# Patient Record
Sex: Female | Born: 1955
Health system: Southern US, Community
[De-identification: ages and names within clinical notes are randomized; demographics above are authoritative.]

## PROBLEM LIST (undated history)

## (undated) DIAGNOSIS — T8859XA Other complications of anesthesia, initial encounter: Secondary | ICD-10-CM

## (undated) DIAGNOSIS — Z8489 Family history of other specified conditions: Secondary | ICD-10-CM

## (undated) DIAGNOSIS — Z860100 Personal history of colon polyps, unspecified: Secondary | ICD-10-CM

## (undated) DIAGNOSIS — I341 Nonrheumatic mitral (valve) prolapse: Secondary | ICD-10-CM

## (undated) DIAGNOSIS — C801 Malignant (primary) neoplasm, unspecified: Secondary | ICD-10-CM

## (undated) DIAGNOSIS — K589 Irritable bowel syndrome without diarrhea: Secondary | ICD-10-CM

## (undated) DIAGNOSIS — I059 Rheumatic mitral valve disease, unspecified: Secondary | ICD-10-CM

## (undated) DIAGNOSIS — R112 Nausea with vomiting, unspecified: Secondary | ICD-10-CM

## (undated) DIAGNOSIS — I10 Essential (primary) hypertension: Secondary | ICD-10-CM

## (undated) DIAGNOSIS — T7840XA Allergy, unspecified, initial encounter: Secondary | ICD-10-CM

## (undated) DIAGNOSIS — Z9889 Other specified postprocedural states: Secondary | ICD-10-CM

## (undated) HISTORY — DX: Allergy, unspecified, initial encounter: T78.40XA

## (undated) HISTORY — DX: Rheumatic mitral valve disease, unspecified: I05.9

## (undated) HISTORY — DX: Irritable bowel syndrome, unspecified: K58.9

## (undated) HISTORY — PX: ABDOMINAL HYSTERECTOMY: SHX81

## (undated) HISTORY — DX: Essential (primary) hypertension: I10

## (undated) HISTORY — PX: SHOULDER SURGERY: SHX246

## (undated) HISTORY — PX: APPENDECTOMY: SHX54

## (undated) HISTORY — DX: Personal history of colon polyps, unspecified: Z86.0100

## (undated) HISTORY — DX: Personal history of colonic polyps: Z86.010

## (undated) HISTORY — PX: CHOLECYSTECTOMY: SHX55

---

## 1998-12-10 HISTORY — PX: LASIK: SHX215

## 2002-11-03 ENCOUNTER — Ambulatory Visit (HOSPITAL_BASED_OUTPATIENT_CLINIC_OR_DEPARTMENT_OTHER): Admission: RE | Admit: 2002-11-03 | Discharge: 2002-11-03 | Payer: Self-pay | Admitting: Orthopaedic Surgery

## 2008-12-14 ENCOUNTER — Ambulatory Visit: Payer: Self-pay | Admitting: Gastroenterology

## 2008-12-24 ENCOUNTER — Ambulatory Visit: Payer: Self-pay | Admitting: Gastroenterology

## 2008-12-24 ENCOUNTER — Encounter: Payer: Self-pay | Admitting: Gastroenterology

## 2008-12-28 ENCOUNTER — Encounter: Payer: Self-pay | Admitting: Gastroenterology

## 2011-04-27 NOTE — Op Note (Signed)
NAME:  Cindy Blake, Cindy Blake                          ACCOUNT NO.:  [id number]   MEDICAL RECORD NO.:  [id number]                   PATIENT TYPE:  AMB   LOCATION:  [facility]                                  FACILITY:  [facility]   PHYSICIAN:  Lubertha Basque. Jerl Santos, M.D.             DATE OF BIRTH:  [birth year]   DATE OF PROCEDURE:  11/03/2002  DATE OF DISCHARGE:                                 OPERATIVE REPORT   PREOPERATIVE DIAGNOSES:  Right shoulder impingement.   POSTOPERATIVE DIAGNOSES:  Right shoulder impingement.   OPERATION PERFORMED:  1. Right shoulder arthroscopic acromioplasty.  2. Right shoulder partial clavicectomy.  3. Right shoulder arthroscopic debridement.   SURGEON:  Lubertha Basque. Jerl Santos, M.D.   ASSISTANT:  Prince Rome, P.A.   ANESTHESIA:  General and block.   INDICATIONS FOR PROCEDURE:  The patient is a 55 year old woman with a many  month history of right shoulder pain.  This has persisted despite oral anti-  inflammatories and an exercise program.  She also achieved transient relief  with two subacromial injections.  She is offered an arthroscopy with pain on  activity and pain at rest.  The procedure was discussed with the patient and  informed operative consent was obtained after discussion of possible  complications of reaction to anesthesia and infection.   DESCRIPTION OF PROCEDURE:  The patient was taken to the operating suite  where general anesthetic was applied without difficulty.  She was also given  a block in the preanesthesia area. The patient was positioned in beach chair  position and prepped and draped in the normal sterile fashion.  After  administration of preop intravenous antibiotics, an arthroscopy of the right  shoulder was performed through a total of two portals.  Glenohumeral joint  showed no degenerative change and the biceps tendon had a tiny tear which  was less than 5% of the substance of its tendon.  The rotator cuff appeared  completely benign  from below.  She did have some adhesions preoperatively  and a brief manipulation was done restoring just the very terminal portion  of her forward flexion and external rotation.  In the subacromial space, she  had some partial thickness tearing of the rotator cuff addressed with a  debridement and a thorough bursectomy.  She had a subacromial morphology  addressed with a bur to change the shape to a type 1 flat acromion.  This  was done with the bur in the lateral position followed by transfer of the  bur to the posterior position.  The cuff of the arm was thoroughly examined  and no full thickness tear could be found.  She did appear to have some  impingement from the undersurface of her clavicle and a brief partial  clavicectomy was done without a formal AC decompression.  The shoulder was  thoroughly irrigated at the end of the case followed by placement of  Marcaine  with epinephrine.  Simple sutures of nylon were used to loosely  reapproximate the portals followed by Adaptic and a dry gauze dressing with  tape.  Estimated blood loss and intraoperative fluids can be obtained from  anesthesia records.    DISPOSITION:  The patient was taken to the recovery room in stable  condition.  Plans were for the patient  to go home the same day and to  follow up in the office in less than a week.  I will contact her by phone  tonight.                                                 Lubertha Basque Jerl Santos, M.D.    PGD/MEDQ  D:  11/03/2002  T:  11/03/2002  Job:  [id number]

## 2013-03-04 ENCOUNTER — Other Ambulatory Visit: Payer: Self-pay | Admitting: *Deleted

## 2013-03-04 MED ORDER — ALPRAZOLAM 1 MG PO TABS: ORAL_TABLET | ORAL | Status: DC

## 2013-05-15 ENCOUNTER — Other Ambulatory Visit (INDEPENDENT_AMBULATORY_CARE_PROVIDER_SITE_OTHER): Payer: 59

## 2013-05-15 DIAGNOSIS — E785 Hyperlipidemia, unspecified: Secondary | ICD-10-CM

## 2013-05-15 DIAGNOSIS — Z0000 Encounter for general adult medical examination without abnormal findings: Secondary | ICD-10-CM

## 2013-05-15 DIAGNOSIS — E559 Vitamin D deficiency, unspecified: Secondary | ICD-10-CM

## 2013-05-15 DIAGNOSIS — I10 Essential (primary) hypertension: Secondary | ICD-10-CM

## 2013-05-15 LAB — POCT CBC
Granulocyte percent: 60.1 %G (ref 37–80)
Lymph, poc: 1.6 (ref 0.6–3.4)
MPV: 8.2 fL (ref 0–99.8)
POC Granulocyte: 2.9 (ref 2–6.9)
POC LYMPH PERCENT: 32.4 %L (ref 10–50)
Platelet Count, POC: 227 10*3/uL (ref 142–424)
RDW, POC: 12.6 %
WBC: 4.9 10*3/uL (ref 4.6–10.2)

## 2013-05-15 LAB — BASIC METABOLIC PANEL WITH GFR
BUN: 11 mg/dL (ref 6–23)
Chloride: 103 mEq/L (ref 96–112)
GFR, Est Non African American: 89 mL/min
Potassium: 4.1 mEq/L (ref 3.5–5.3)

## 2013-05-15 LAB — THYROID PANEL WITH TSH: TSH: 1.332 u[IU]/mL (ref 0.350–4.500)

## 2013-05-18 LAB — NMR LIPOPROFILE WITH LIPIDS
HDL Particle Number: 42 umol/L (ref >=30.5)
HDL Size: 9.8 nm (ref >=9.2)
LDL (calc): 129 mg/dL — ABNORMAL HIGH (ref <100)
LDL Particle Number: 1321 nmol/L — ABNORMAL HIGH (ref <1000)
LDL Size: 21.8 nm (ref >20.5)
LP-IR Score: <25 (ref <=45)
Small LDL Particle Number: 202 nmol/L (ref <=527)
VLDL Size: 38.9 nm (ref <=46.6)

## 2013-05-20 ENCOUNTER — Telehealth: Payer: Self-pay | Admitting: *Deleted

## 2013-05-20 NOTE — Telephone Encounter (Signed)
Pt notified of results

## 2013-05-20 NOTE — Telephone Encounter (Signed)
Message copied by Bearl Mulberry on Wed May 20, 2013  6:17 PM ------      Message from: Ernestina Penna      Created: Mon May 18, 2013  5:48 PM       The total LDL particle number is elevated at 1321      LDL C. is elevated at 129, triglyceride are good, LDL size is large------ try to do better with therapeutic lifestyle changes which maintenance diet and exercise      All thyroid tests within normal limit      Vitamin D level is good      Blood sugar and renal function tests good             ------

## 2013-05-21 ENCOUNTER — Encounter: Payer: Self-pay | Admitting: *Deleted

## 2013-05-25 ENCOUNTER — Telehealth: Payer: Self-pay | Admitting: Family Medicine

## 2013-05-25 NOTE — Telephone Encounter (Signed)
appt made

## 2013-06-17 ENCOUNTER — Ambulatory Visit: Payer: Self-pay | Admitting: Family Medicine

## 2013-06-23 ENCOUNTER — Ambulatory Visit: Payer: Self-pay | Admitting: Family Medicine

## 2013-07-06 ENCOUNTER — Ambulatory Visit (INDEPENDENT_AMBULATORY_CARE_PROVIDER_SITE_OTHER): Payer: 59 | Admitting: Family Medicine

## 2013-07-06 ENCOUNTER — Encounter: Payer: Self-pay | Admitting: Family Medicine

## 2013-07-06 VITALS — BP 146/64 | HR 66 | Temp 97.5°F | Ht 63.0 in | Wt 157.4 lb

## 2013-07-06 DIAGNOSIS — E559 Vitamin D deficiency, unspecified: Secondary | ICD-10-CM

## 2013-07-06 DIAGNOSIS — E785 Hyperlipidemia, unspecified: Secondary | ICD-10-CM

## 2013-07-06 DIAGNOSIS — I10 Essential (primary) hypertension: Secondary | ICD-10-CM

## 2013-07-06 DIAGNOSIS — B079 Viral wart, unspecified: Secondary | ICD-10-CM

## 2013-07-06 NOTE — Patient Instructions (Addendum)
Continue current meds and therapeutic lifestyle changes Return FOBT There may be slight swelling at the site of the cryotherapy. Just keep the area is cleaned with soap and water and or alcohol. Use allergy medicine regularly May have to add an antihistamine like Allegra, Claritin, Zyrtec, or Benadryl. Drink plenty of fluids Avoid conditions which would be aggravating to the respiratory tract.

## 2013-07-06 NOTE — Progress Notes (Signed)
  Subjective:    Patient ID: Cindy Blake, female    DOB: 1956/07/01, 57 y.o.   MRN: [id number]  HPI Patient returns to clinic today for followup of chronic medical problems. These include hypertension hyperlipidemia vitamin D deficiency and irritable bowel syndrome. She also describes arthralgias in her back. She also complains of some lesions on her chest and her left finger.   Review of Systems  Constitutional: Positive for fatigue.  HENT: Positive for congestion, postnasal drip and sinus pressure. Negative for ear pain and sore throat.   Eyes: Negative.   Respiratory: Positive for cough (dry). Negative for chest tightness and wheezing.   Cardiovascular: Positive for leg swelling (slight).  Gastrointestinal: Positive for abdominal pain (with IBS), diarrhea (intermitent due to IBS) and constipation (intermitent due to IBS). Negative for vomiting.  Genitourinary: Positive for frequency. Negative for dysuria.  Musculoskeletal: Positive for back pain (mid thoracic, LBP) and arthralgias (bilateral legs and hips, knees occasional shoulders).  Skin: Positive for rash (L upper chest).  Allergic/Immunologic: Positive for environmental allergies.  Neurological: Positive for dizziness (occasional) and headaches.  Hematological: Negative.   Psychiatric/Behavioral: Positive for sleep disturbance (intermitent).       Objective:   Physical Exam BP 146/64  Pulse 66  Temp(Src) 97.5 F (36.4 C) (Oral)  Ht 5\' 3"  (1.6 m)  Wt 157 lb 6.4 oz (71.396 kg)  BMI 27.89 kg/m2  The patient appeared well nourished and normally developed, alert and oriented to time and place. Speech, behavior and judgement appear normal. Vital signs as documented.  Head exam is unremarkable. No scleral icterus or pallor noted. There was slight nasal congestion. Mouth and throat were normal. Ears were normal.  Neck is without jugular venous distension, thyromegally, or carotid bruits. Carotid upstrokes are brisk bilaterally.  No cervical adenopathy. Lungs are clear anteriorly and posteriorly to auscultation. Normal respiratory effort. Cardiac exam reveals regular rate and rhythm at 72 per minute. First and second heart sounds normal.  No murmurs, rubs or gallops.  Abdominal exam reveals normal bowl sounds, no masses, no organomegaly and no aortic enlargement. No inguinal adenopathy. There is no tenderness. Extremities are nonedematous and both femoral and pedal pulses are normal. Skin without pallor or jaundice.  Warm and dry, without rash. Patient has 2-3 warty-like lesions on her anterior chest wall and one on her left third finger. Neurologic exam reveals normal deep tendon reflexes and normal sensation.    Cryotherapy will be done on the chest wall lesion as well as a the left finger were lesion. Patient understood the procedure.      Assessment & Plan:  1. Hypertension  2. Hyperlipemia  3. Vitamin D deficiency  4. Flat warts of chest wall and finger Patient Instructions  Continue current meds and therapeutic lifestyle changes Return FOBT There may be slight swelling at the site of the cryotherapy. Just keep the area is cleaned with soap and water and or alcohol. Use allergy medicine regularly May have to add an antihistamine like Allegra, Claritin, Zyrtec, or Benadryl. Drink plenty of fluids Avoid conditions which would be aggravating to the respiratory tract.   Nyra Capes MD

## 2013-08-13 ENCOUNTER — Other Ambulatory Visit: Payer: Self-pay | Admitting: Family Medicine

## 2013-10-07 ENCOUNTER — Other Ambulatory Visit: Payer: Self-pay | Admitting: Family Medicine

## 2013-10-15 ENCOUNTER — Other Ambulatory Visit: Payer: Self-pay

## 2013-10-29 ENCOUNTER — Other Ambulatory Visit: Payer: Self-pay | Admitting: Family Medicine

## 2013-10-30 NOTE — Telephone Encounter (Signed)
This is okay for 3 months 

## 2013-10-30 NOTE — Telephone Encounter (Signed)
Patient last seen in office on 07-06-13. 5 refills given on 02-12-13. Please advise. If approved please route to Pool A so nurse can phone in to pharmacy

## 2013-11-02 NOTE — Telephone Encounter (Signed)
Pt med called into pharm  

## 2013-11-10 ENCOUNTER — Encounter: Payer: Self-pay | Admitting: Family Medicine

## 2013-11-10 ENCOUNTER — Ambulatory Visit (INDEPENDENT_AMBULATORY_CARE_PROVIDER_SITE_OTHER): Payer: 59

## 2013-11-10 ENCOUNTER — Ambulatory Visit (INDEPENDENT_AMBULATORY_CARE_PROVIDER_SITE_OTHER): Payer: 59 | Admitting: Family Medicine

## 2013-11-10 VITALS — BP 143/69 | HR 67 | Temp 97.6°F | Ht 63.0 in | Wt 145.0 lb

## 2013-11-10 DIAGNOSIS — F411 Generalized anxiety disorder: Secondary | ICD-10-CM

## 2013-11-10 DIAGNOSIS — E559 Vitamin D deficiency, unspecified: Secondary | ICD-10-CM

## 2013-11-10 DIAGNOSIS — I10 Essential (primary) hypertension: Secondary | ICD-10-CM

## 2013-11-10 DIAGNOSIS — J309 Allergic rhinitis, unspecified: Secondary | ICD-10-CM

## 2013-11-10 DIAGNOSIS — E785 Hyperlipidemia, unspecified: Secondary | ICD-10-CM

## 2013-11-10 MED ORDER — AZELASTINE HCL 0.1 % NA SOLN: NASAL | Status: DC

## 2013-11-10 NOTE — Progress Notes (Signed)
Subjective:    Patient ID: Cindy Blake, female    DOB: May 15, 1956, 57 y.o.   MRN: [id number]  HPI Pt here for follow up and management of chronic medical problems. Patient complains today of increased sinus pressure and congestion. She also complains with some problems with her legs being restless at nighttime       Patient Active Problem List   Diagnosis Date Noted  . Hyperlipemia 07/06/2013  . Hypertension 07/06/2013  . Vitamin D deficiency 07/06/2013   Outpatient Encounter Prescriptions as of 11/10/2013  Medication Sig  . ALPRAZolam (XANAX) 1 MG tablet TAKE 1/2 TO 1 TABLET EVERY MORNING & BEDTIME AS NEEDED FOR ANXIETY & SLEEP  . aspirin 81 MG tablet Take 81 mg by mouth daily.  . Calcium Carbonate (CALCIUM 600 PO) Take 1 tablet by mouth daily.  . Cholecalciferol (VITAMIN D) 2000 UNITS CAPS Take 1 capsule by mouth daily.  Marland Kitchen estradiol (ESTRACE) 1 MG tablet Take 1 mg by mouth daily.  . fluticasone (FLONASE) 50 MCG/ACT nasal spray 1 SPRAY IN EACH NOSTRIL ONCE A DAY  . hydrochlorothiazide (HYDRODIURIL) 25 MG tablet TAKE 1 TABLET ONCE A DAY  . losartan (COZAAR) 50 MG tablet TAKE 1 TABLET ONCE A DAY  . Multiple Vitamin (MULTIVITAMIN) tablet Take 1 tablet by mouth daily.    Review of Systems  Constitutional: Negative.   HENT: Positive for sinus pressure (on mucinex x 1 week).   Eyes: Negative.   Respiratory: Negative.   Cardiovascular: Negative.   Gastrointestinal: Negative.   Endocrine: Negative.   Genitourinary: Negative.   Musculoskeletal: Negative.   Skin: Negative.   Allergic/Immunologic: Negative.   Neurological: Negative.   Hematological: Negative.   Psychiatric/Behavioral: Negative.        Objective:   Physical Exam  Nursing note and vitals reviewed. Constitutional: She is oriented to person, place, and time. She appears well-developed and well-nourished. No distress.  HENT:  Head: Normocephalic and atraumatic.  Right Ear: External ear normal.  Left Ear:  External ear normal.  Mouth/Throat: Oropharynx is clear and moist.  Nasal irritation bilateral  Eyes: Conjunctivae and EOM are normal. Pupils are equal, round, and reactive to light. Right eye exhibits no discharge. Left eye exhibits no discharge. No scleral icterus.  Neck: Normal range of motion. Neck supple. No thyromegaly present.  Cardiovascular: Normal rate, regular rhythm and intact distal pulses.  Exam reveals gallop.   No murmur heard. 72 per minute  Pulmonary/Chest: Effort normal and breath sounds normal. No respiratory distress. She has no wheezes. She has no rales. She exhibits no tenderness.  Abdominal: Soft. Bowel sounds are normal. She exhibits no mass. There is no tenderness. There is no rebound and no guarding.  Musculoskeletal: Normal range of motion. She exhibits no edema and no tenderness.  Lymphadenopathy:    She has no cervical adenopathy.  Neurological: She is alert and oriented to person, place, and time. She has normal reflexes. No cranial nerve deficit.  Skin: Skin is warm and dry. No rash noted.  Psychiatric: She has a normal mood and affect. Her behavior is normal. Judgment and thought content normal.   BP 143/69  Pulse 67  Temp(Src) 97.6 F (36.4 C) (Oral)  Ht 5\' 3"  (1.6 m)  Wt 145 lb (65.772 kg)  BMI 25.69 kg/m2        Assessment & Plan:  1. Hypertension - DG Chest 2 View; Future  2. Vitamin D deficiency  3. Hyperlipemia  4. Generalized anxiety disorder  5.  Allergic rhinitis - azelastine (ASTELIN) 137 MCG/SPRAY nasal spray; Use in each nostril as directed 1-2 sprays  Dispense: 30 mL; Refill: 12  Meds ordered this encounter  Medications  . aspirin 81 MG tablet    Sig: Take 81 mg by mouth daily.  Marland Kitchen azelastine (ASTELIN) 137 MCG/SPRAY nasal spray    Sig: Use in each nostril as directed 1-2 sprays    Dispense:  30 mL    Refill:  12   Patient Instructions  Continue current medications. Continue good therapeutic lifestyle changes which  include good diet and exercise. Fall precautions discussed with patient. Schedule your flu vaccine if you haven't had it yet If you are over 28 years old - you may need Prevnar 13 or the adult Pneumonia vaccine. Drink plenty of water Try to get up frequently during the day and walk around more, used the steps instead of the elevator Use some good support hose this morning her Use a cool mist humidifier in the home at night Use the additional nose spray along with the flonase Continue to monitor blood pressures at home when possible and watch her sodium intake   Nyra Capes MD

## 2013-11-10 NOTE — Patient Instructions (Addendum)
Continue current medications. Continue good therapeutic lifestyle changes which include good diet and exercise. Fall precautions discussed with patient. Schedule your flu vaccine if you haven't had it yet If you are over 57 years old - you may need Prevnar 13 or the adult Pneumonia vaccine. Drink plenty of water Try to get up frequently during the day and walk around more, used the steps instead of the elevator Use some good support hose this morning her Use a cool mist humidifier in the home at night Use the additional nose spray along with the flonase Continue to monitor blood pressures at home when possible and watch her sodium intake

## 2013-11-11 ENCOUNTER — Telehealth: Payer: Self-pay | Admitting: Family Medicine

## 2013-11-11 ENCOUNTER — Telehealth: Payer: Self-pay

## 2013-11-11 NOTE — Telephone Encounter (Signed)
Message copied by Roselee Culver on Wed Nov 11, 2013 10:27 AM ------      Message from: Ernestina Penna      Created: Wed Nov 11, 2013  9:58 AM       As per radiology report ------

## 2013-11-20 ENCOUNTER — Other Ambulatory Visit (INDEPENDENT_AMBULATORY_CARE_PROVIDER_SITE_OTHER): Payer: 59

## 2013-11-20 DIAGNOSIS — Z1212 Encounter for screening for malignant neoplasm of rectum: Secondary | ICD-10-CM

## 2013-11-20 NOTE — Progress Notes (Signed)
Pt dropped off FOBT only 

## 2013-11-22 LAB — FECAL OCCULT BLOOD, IMMUNOCHEMICAL: Fecal Occult Bld: NEGATIVE

## 2013-12-16 ENCOUNTER — Encounter: Payer: Self-pay | Admitting: General Practice

## 2013-12-16 ENCOUNTER — Ambulatory Visit (INDEPENDENT_AMBULATORY_CARE_PROVIDER_SITE_OTHER): Payer: 59 | Admitting: General Practice

## 2013-12-16 VITALS — BP 146/70 | HR 66 | Temp 98.9°F | Ht 63.0 in | Wt 144.5 lb

## 2013-12-16 DIAGNOSIS — J029 Acute pharyngitis, unspecified: Secondary | ICD-10-CM

## 2013-12-16 DIAGNOSIS — J019 Acute sinusitis, unspecified: Secondary | ICD-10-CM

## 2013-12-16 LAB — POCT RAPID STREP A (OFFICE): Rapid Strep A Screen: NEGATIVE

## 2013-12-16 MED ORDER — PREDNISONE (PAK) 10 MG PO TABS: ORAL_TABLET | ORAL | Status: DC

## 2013-12-16 MED ORDER — AZITHROMYCIN 250 MG PO TABS: ORAL_TABLET | ORAL | Status: DC

## 2013-12-16 NOTE — Patient Instructions (Signed)

## 2013-12-16 NOTE — Progress Notes (Signed)
   Subjective:    Patient ID: Cindy Blake, female    DOB: January 28, 1956, 58 y.o.   MRN: [id number]  Sinusitis This is a new problem. The current episode started in the past 7 days. The problem is unchanged. There has been no fever. Associated symptoms include congestion, coughing and sinus pressure. Pertinent negatives include no chills or shortness of breath. Past treatments include oral decongestants and spray decongestants. The treatment provided mild relief.  Sore Throat  This is a new problem. The problem has been gradually improving. There has been no fever. Associated symptoms include congestion and coughing. Pertinent negatives include no diarrhea or shortness of breath. She has had no exposure to strep or mono. She has tried nothing for the symptoms.      Review of Systems  Constitutional: Negative for fever and chills.  HENT: Positive for congestion, postnasal drip and sinus pressure.   Respiratory: Positive for cough. Negative for chest tightness and shortness of breath.   Cardiovascular: Negative for chest pain and palpitations.  Gastrointestinal: Negative for diarrhea.  All other systems reviewed and are negative.       Objective:   Physical Exam  Constitutional: She is oriented to person, place, and time. She appears well-developed and well-nourished.  HENT:  Head: Normocephalic and atraumatic.  Right Ear: External ear normal.  Left Ear: External ear normal.  Nose: Right sinus exhibits maxillary sinus tenderness and frontal sinus tenderness. Left sinus exhibits maxillary sinus tenderness and frontal sinus tenderness.  Mouth/Throat: Oropharynx is clear and moist.  Cardiovascular: Normal rate, regular rhythm and normal heart sounds.   Pulmonary/Chest: Effort normal and breath sounds normal. No respiratory distress. She exhibits no tenderness.  Neurological: She is alert and oriented to person, place, and time.  Skin: Skin is warm and dry.  Psychiatric: She has a normal  mood and affect.     Results for orders placed in visit on 12/16/13  POCT RAPID STREP A (OFFICE)      Result Value Range   Rapid Strep A Screen Negative  Negative        Assessment & Plan:  1. Sore throat  - POCT rapid strep A  2. Sinusitis, acute  - azithromycin (ZITHROMAX) 250 MG tablet; Take as directed  Dispense: 6 tablet; Refill: 0 - predniSONE (STERAPRED UNI-PAK) 10 MG tablet; Take as directed  Dispense: 21 tablet; Refill: 0 -RTO if symptoms worsen or unresolved -Patient verbalized understanding Erby Pian, FNP-C

## 2013-12-17 ENCOUNTER — Telehealth: Payer: Self-pay | Admitting: General Practice

## 2014-03-09 ENCOUNTER — Ambulatory Visit (INDEPENDENT_AMBULATORY_CARE_PROVIDER_SITE_OTHER): Payer: 59 | Admitting: Family Medicine

## 2014-03-09 ENCOUNTER — Encounter: Payer: Self-pay | Admitting: Family Medicine

## 2014-03-09 VITALS — BP 140/70 | HR 59 | Temp 99.8°F | Ht 63.0 in | Wt 142.0 lb

## 2014-03-09 DIAGNOSIS — I10 Essential (primary) hypertension: Secondary | ICD-10-CM

## 2014-03-09 DIAGNOSIS — E559 Vitamin D deficiency, unspecified: Secondary | ICD-10-CM

## 2014-03-09 DIAGNOSIS — E785 Hyperlipidemia, unspecified: Secondary | ICD-10-CM

## 2014-03-09 DIAGNOSIS — J309 Allergic rhinitis, unspecified: Secondary | ICD-10-CM

## 2014-03-09 DIAGNOSIS — J329 Chronic sinusitis, unspecified: Secondary | ICD-10-CM

## 2014-03-09 MED ORDER — AZELASTINE HCL 0.1 % NA SOLN: NASAL | Status: DC

## 2014-03-09 MED ORDER — AMOXICILLIN 500 MG PO CAPS: 500.0000 mg | ORAL_CAPSULE | Freq: Three times a day (TID) | ORAL | Status: DC

## 2014-03-09 MED ORDER — FLUTICASONE PROPIONATE 50 MCG/ACT NA SUSP: NASAL | Status: DC

## 2014-03-09 NOTE — Progress Notes (Signed)
Subjective:    Patient ID: Cindy Blake, female    DOB: Jan 08, 1956, 58 y.o.   MRN: [id number]  HPI Pt here for follow up and management of chronic medical problems. The patient complains of head congestion some cough and fever further several days. She is up-to-date on her health maintenance issues he'll get her lab work done when she comes in June.        Patient Active Problem List   Diagnosis Date Noted  . Generalized anxiety disorder 11/10/2013  . Allergic rhinitis 11/10/2013  . Hyperlipemia 07/06/2013  . Hypertension 07/06/2013  . Vitamin D deficiency 07/06/2013   Outpatient Encounter Prescriptions as of 03/09/2014  Medication Sig  . ALPRAZolam (XANAX) 1 MG tablet TAKE 1/2 TO 1 TABLET EVERY MORNING & BEDTIME AS NEEDED FOR ANXIETY & SLEEP  . aspirin 81 MG tablet Take 81 mg by mouth daily.  Marland Kitchen azelastine (ASTELIN) 137 MCG/SPRAY nasal spray Use in each nostril as directed 1-2 sprays  . Calcium Carbonate (CALCIUM 600 PO) Take 1 tablet by mouth daily.  . Cholecalciferol (VITAMIN D) 2000 UNITS CAPS Take 1 capsule by mouth daily.  Marland Kitchen estradiol (ESTRACE) 1 MG tablet Take 1 mg by mouth daily.  . fluticasone (FLONASE) 50 MCG/ACT nasal spray 1 SPRAY IN EACH NOSTRIL ONCE A DAY  . hydrochlorothiazide (HYDRODIURIL) 25 MG tablet TAKE 1 TABLET ONCE A DAY  . losartan (COZAAR) 50 MG tablet TAKE 1 TABLET ONCE A DAY  . Multiple Vitamin (MULTIVITAMIN) tablet Take 1 tablet by mouth daily.  . [DISCONTINUED] azithromycin (ZITHROMAX) 250 MG tablet Take as directed  . [DISCONTINUED] predniSONE (STERAPRED UNI-PAK) 10 MG tablet Take as directed    Review of Systems  Constitutional: Positive for fever.  HENT: Positive for congestion, sinus pressure and sore throat (started sunday).        Ringing in ears  Eyes: Negative.   Respiratory: Positive for cough (little).   Cardiovascular: Negative.   Gastrointestinal: Negative.   Endocrine: Negative.   Genitourinary: Negative.   Musculoskeletal:  Negative.        Left hand trembles at times  Skin: Negative.   Allergic/Immunologic: Negative.   Neurological: Positive for light-headedness and headaches.  Hematological: Negative.   Psychiatric/Behavioral: Negative.        Objective:   Physical Exam  Nursing note and vitals reviewed. Constitutional: She is oriented to person, place, and time. She appears well-developed and well-nourished. No distress.  HENT:  Head: Normocephalic and atraumatic.  Right Ear: External ear normal.  Left Ear: External ear normal.  Mouth/Throat: Oropharynx is clear and moist. No oropharyngeal exudate.  Nasal congestion bilaterally left greater than right  Eyes: Conjunctivae and EOM are normal. Pupils are equal, round, and reactive to light. Right eye exhibits no discharge. Left eye exhibits no discharge. No scleral icterus.  Neck: Normal range of motion. Neck supple. No thyromegaly present.  Anterior cervical tenderness  Cardiovascular: Normal rate, regular rhythm, normal heart sounds and intact distal pulses.  Exam reveals no gallop and no friction rub.   No murmur heard. Pulmonary/Chest: Effort normal and breath sounds normal. No respiratory distress. She has no wheezes. She has no rales. She exhibits no tenderness.  Dry cough  Abdominal: Soft. Bowel sounds are normal. She exhibits no mass. There is no tenderness. There is no rebound and no guarding.  Musculoskeletal: Normal range of motion. She exhibits no edema.  Lymphadenopathy:    She has no cervical adenopathy.  Neurological: She is alert and oriented to  person, place, and time. She has normal reflexes. No cranial nerve deficit.  Skin: Skin is warm and dry. No rash noted.  Psychiatric: She has a normal mood and affect. Her behavior is normal. Judgment and thought content normal.   BP 140/70  Pulse 59  Temp(Src) 99.8 F (37.7 C) (Oral)  Ht 5\' 3"  (1.6 m)  Wt 142 lb (64.411 kg)  BMI 25.16 kg/m2        Assessment & Plan:  1.  Hyperlipemia -Recheck this in June  2. Hypertension -Home readings are generally running in the 130s over the 70s, she will continue to monitor her blood pressures at home  3. Vitamin D deficiency -Continue current  4. Allergic rhinitis - fluticasone (FLONASE) 50 MCG/ACT nasal spray; 1-2 sprays each nostril daily at bedtime as directed  Dispense: 16 g; Refill: 1 - azelastine (ASTELIN) 137 MCG/SPRAY nasal spray; Use in each nostril as directed 1-2 sprays  Dispense: 30 mL; Refill: 12  5. Rhinosinusitis - fluticasone (FLONASE) 50 MCG/ACT nasal spray; 1-2 sprays each nostril daily at bedtime as directed  Dispense: 16 g; Refill: 1 - amoxicillin (AMOXIL) 500 MG capsule; Take 1 capsule (500 mg total) by mouth 3 (three) times daily.  Dispense: 30 capsule; Refill: 0   Patient Instructions  Use saline nose spray frequently during the day Use Mucinex maximum strength blue and white in color over the counter one twice daily for cough and congestion If the extra doses of Flonase and Astelin did not work he may for short period of time try some Claritin 1 daily in addition to the nose sprays Drink plenty of fluids Take Tylenol as needed for aches pains and fever   Arrie Senate MD

## 2014-03-09 NOTE — Patient Instructions (Signed)
Use saline nose spray frequently during the day Use Mucinex maximum strength blue and white in color over the counter one twice daily for cough and congestion If the extra doses of Flonase and Astelin did not work he may for short period of time try some Claritin 1 daily in addition to the nose sprays Drink plenty of fluids Take Tylenol as needed for aches pains and fever

## 2014-04-13 ENCOUNTER — Other Ambulatory Visit: Payer: Self-pay | Admitting: Family Medicine

## 2014-04-15 NOTE — Telephone Encounter (Signed)
This medicine is okay to refill for 6 months

## 2014-04-15 NOTE — Telephone Encounter (Signed)
Called to [facility]  

## 2014-04-15 NOTE — Telephone Encounter (Signed)
Last seen 03/09/14, last filled 01/27/14, Route to pool so it can be called into [facility]

## 2014-05-29 ENCOUNTER — Other Ambulatory Visit: Payer: Self-pay | Admitting: Nurse Practitioner

## 2014-05-31 NOTE — Telephone Encounter (Signed)
Patient NTBS for follow up and lab work  

## 2014-07-20 ENCOUNTER — Ambulatory Visit: Payer: 59 | Admitting: Family Medicine

## 2014-07-22 ENCOUNTER — Ambulatory Visit (INDEPENDENT_AMBULATORY_CARE_PROVIDER_SITE_OTHER): Payer: 59 | Admitting: Family Medicine

## 2014-07-22 ENCOUNTER — Encounter: Payer: Self-pay | Admitting: Family Medicine

## 2014-07-22 VITALS — BP 140/88 | HR 57 | Temp 99.1°F | Ht 63.0 in | Wt 146.0 lb

## 2014-07-22 DIAGNOSIS — I10 Essential (primary) hypertension: Secondary | ICD-10-CM

## 2014-07-22 DIAGNOSIS — E785 Hyperlipidemia, unspecified: Secondary | ICD-10-CM

## 2014-07-22 DIAGNOSIS — E559 Vitamin D deficiency, unspecified: Secondary | ICD-10-CM

## 2014-07-22 LAB — POCT CBC
Granulocyte percent: 72.5 %G (ref 37–80)
HCT, POC: 38.2 % (ref 37.7–47.9)
HEMOGLOBIN: 12.9 g/dL (ref 12.2–16.2)
Lymph, poc: 1.5 (ref 0.6–3.4)
MCH, POC: 30.4 pg (ref 27–31.2)
MCHC: 33.8 g/dL (ref 31.8–35.4)
MCV: 90 fL (ref 80–97)
MPV: 8.5 fL (ref 0–99.8)
POC GRANULOCYTE: 4.5 (ref 2–6.9)
POC LYMPH %: 23.5 % (ref 10–50)
Platelet Count, POC: 229 10*3/uL (ref 142–424)
RBC: 4.3 M/uL (ref 4.04–5.48)
RDW, POC: 12.8 %
WBC: 6.2 10*3/uL (ref 4.6–10.2)

## 2014-07-22 MED ORDER — HYDROCHLOROTHIAZIDE 25 MG PO TABS: ORAL_TABLET | ORAL | Status: DC

## 2014-07-22 MED ORDER — LOSARTAN POTASSIUM 50 MG PO TABS: ORAL_TABLET | ORAL | Status: DC

## 2014-07-22 NOTE — Patient Instructions (Addendum)
Continue current medications. Continue good therapeutic lifestyle changes which include good diet and exercise. Fall precautions discussed with patient. If an FOBT was given today- please return it to our front desk. If you are over 58 years old - you may need Prevnar 60 or the adult Pneumonia vaccine.  Flu Shots will be available at our office starting mid- September. Please call and schedule a FLU CLINIC APPOINTMENT. Monitor blood pressures at and brain blood pressures by to review in about 4 weeks Watch sodium intake Drink plenty of water NSAIDs are fine for headaches but remember taking irritate the stomach, also blood pressure to go up, and sometimes can cause edema in your legs.

## 2014-07-22 NOTE — Progress Notes (Signed)
Subjective:    Patient ID: Cindy Blake, female    DOB: 1956-03-27, 58 y.o.   MRN: [id number]  HPI Pt here for follow up and management of chronic medical problems. The patient has no specific complaints. She is up-to-date on her health maintenance issues except for a Prevnar vaccine. She will check with her insurance regarding this. She will get lab work today.         Patient Active Problem List   Diagnosis Date Noted  . Generalized anxiety disorder 11/10/2013  . Allergic rhinitis 11/10/2013  . Hyperlipemia 07/06/2013  . Hypertension 07/06/2013  . Vitamin D deficiency 07/06/2013   Outpatient Encounter Prescriptions as of 07/22/2014  Medication Sig  . ALPRAZolam (XANAX) 1 MG tablet TAKE 1/2 TO 1 TABLET EVERY MORNING & BEDTIME AS NEEDED FOR ANXIETY & SLEEP  . aspirin 81 MG tablet Take 81 mg by mouth daily.  Marland Kitchen azelastine (ASTELIN) 137 MCG/SPRAY nasal spray Use in each nostril as directed 1-2 sprays  . Calcium Carbonate (CALCIUM 600 PO) Take 1 tablet by mouth daily.  . Cholecalciferol (VITAMIN D) 2000 UNITS CAPS Take 1 capsule by mouth daily.  Marland Kitchen estradiol (ESTRACE) 1 MG tablet Take 1 mg by mouth daily.  . fexofenadine (ALLEGRA) 180 MG tablet Take 180 mg by mouth daily.  . fluticasone (FLONASE) 50 MCG/ACT nasal spray 1-2 sprays each nostril daily at bedtime as directed  . hydrochlorothiazide (HYDRODIURIL) 25 MG tablet TAKE 1 TABLET ONCE A DAY  . losartan (COZAAR) 50 MG tablet TAKE 1 TABLET DAILY  . Multiple Vitamin (MULTIVITAMIN) tablet Take 1 tablet by mouth daily.  . [DISCONTINUED] amoxicillin (AMOXIL) 500 MG capsule Take 1 capsule (500 mg total) by mouth 3 (three) times daily.    Review of Systems  Constitutional: Negative.   HENT: Negative.   Eyes: Negative.   Respiratory: Negative.   Cardiovascular: Negative.   Gastrointestinal: Negative.   Endocrine: Negative.   Genitourinary: Negative.   Musculoskeletal: Negative.   Skin: Negative.   Allergic/Immunologic:  Negative.   Neurological: Negative.   Hematological: Negative.   Psychiatric/Behavioral: Negative.        Objective:   Physical Exam  Nursing note and vitals reviewed. Constitutional: She is oriented to person, place, and time. She appears well-developed and well-nourished. No distress.  HENT:  Head: Normocephalic and atraumatic.  Right Ear: External ear normal.  Left Ear: External ear normal.  Nose: Nose normal.  Mouth/Throat: Oropharynx is clear and moist.  Eyes: Conjunctivae and EOM are normal. Pupils are equal, round, and reactive to light. Right eye exhibits no discharge. Left eye exhibits no discharge. No scleral icterus.  Neck: Normal range of motion. Neck supple. No thyromegaly present.  No carotid bruits auscultated  Cardiovascular: Normal rate, regular rhythm, normal heart sounds and intact distal pulses.  Exam reveals no gallop and no friction rub.   No murmur heard. 72 per minute with a regular rate and rhythm  Pulmonary/Chest: Effort normal and breath sounds normal. No respiratory distress. She has no wheezes. She has no rales. She exhibits no tenderness.  Abdominal: Soft. Bowel sounds are normal. She exhibits no mass. There is no tenderness. There is no rebound and no guarding.  Musculoskeletal: Normal range of motion. She exhibits no edema and no tenderness.  Lymphadenopathy:    She has no cervical adenopathy.  Neurological: She is alert and oriented to person, place, and time. She has normal reflexes. No cranial nerve deficit.  Skin: Skin is warm and dry. No rash  noted.  Psychiatric: She has a normal mood and affect. Her behavior is normal. Judgment and thought content normal.   BP 154/68  Pulse 57  Temp(Src) 99.1 F (37.3 C) (Oral)  Ht _0  (1.6 m)  Wt 146 lb (66.225 kg)  BMI 25.87 kg/m2  Repeat blood pressure 140/88      Assessment & Plan:  1. Hyperlipemia - NMR, lipoprofile - POCT CBC  2. Essential hypertension - BMP8+EGFR - Hepatic function  panel - POCT CBC  3. Vitamin D deficiency - POCT CBC - Vit D  25 hydroxy (rtn osteoporosis monitoring)  Meds ordered this encounter  Medications  . fexofenadine (ALLEGRA) 180 MG tablet    Sig: Take 180 mg by mouth daily.  Marland Kitchen losartan (COZAAR) 50 MG tablet    Sig: TAKE 1 TABLET DAILY    Dispense:  30 tablet    Refill:  6  . hydrochlorothiazide (HYDRODIURIL) 25 MG tablet    Sig: TAKE 1 TABLET ONCE A DAY    Dispense:  30 tablet    Refill:  6   Patient Instructions  Continue current medications. Continue good therapeutic lifestyle changes which include good diet and exercise. Fall precautions discussed with patient. If an FOBT was given today- please return it to our front desk. If you are over 35 years old - you may need Prevnar 43 or the adult Pneumonia vaccine.  Flu Shots will be available at our office starting mid- September. Please call and schedule a FLU CLINIC APPOINTMENT. Monitor blood pressures at and brain blood pressures by to review in about 4 weeks Watch sodium intake Drink plenty of water NSAIDs are fine for headaches but remember taking irritate the stomach, also blood pressure to go up, and sometimes can cause edema in your legs.      Arrie Senate MD

## 2014-07-23 LAB — BMP8+EGFR
BUN/Creatinine Ratio: 21 (ref 9–23)
BUN: 15 mg/dL (ref 6–24)
CHLORIDE: 102 mmol/L (ref 97–108)
CO2: 26 mmol/L (ref 18–29)
Calcium: 9.6 mg/dL (ref 8.7–10.2)
Creatinine, Ser: 0.73 mg/dL (ref 0.57–1.00)
GFR calc non Af Amer: 91 mL/min/{1.73_m2} (ref >59)
GFR, EST AFRICAN AMERICAN: 105 mL/min/{1.73_m2} (ref >59)
GLUCOSE: 91 mg/dL (ref 65–99)
POTASSIUM: 4.9 mmol/L (ref 3.5–5.2)
Sodium: 145 mmol/L — ABNORMAL HIGH (ref 134–144)

## 2014-07-23 LAB — NMR, LIPOPROFILE
CHOLESTEROL: 213 mg/dL — AB (ref 100–199)
HDL Cholesterol by NMR: 91 mg/dL (ref >39)
HDL Particle Number: 39.2 umol/L (ref >=30.5)
LDL Particle Number: 926 nmol/L (ref <1000)
LDL Size: 21.8 nm (ref >20.5)
LDLC SERPL CALC-MCNC: 109 mg/dL — AB (ref 0–99)
LP-IR Score: <25 (ref <=45)
Small LDL Particle Number: <90 nmol/L (ref <=527)
TRIGLYCERIDES BY NMR: 64 mg/dL (ref 0–149)

## 2014-07-23 LAB — HEPATIC FUNCTION PANEL
ALT: 7 IU/L (ref 0–32)
AST: 16 IU/L (ref 0–40)
Albumin: 4.4 g/dL (ref 3.5–5.5)
Alkaline Phosphatase: 86 IU/L (ref 39–117)
Bilirubin, Direct: 0.12 mg/dL (ref 0.00–0.40)
TOTAL PROTEIN: 6.9 g/dL (ref 6.0–8.5)
Total Bilirubin: 0.3 mg/dL (ref 0.0–1.2)

## 2014-07-23 LAB — VITAMIN D 25 HYDROXY (VIT D DEFICIENCY, FRACTURES): VIT D 25 HYDROXY: 45.1 ng/mL (ref 30.0–100.0)

## 2014-11-25 ENCOUNTER — Ambulatory Visit: Payer: 59 | Admitting: Family Medicine

## 2014-12-14 ENCOUNTER — Encounter: Payer: Self-pay | Admitting: Family Medicine

## 2014-12-14 ENCOUNTER — Ambulatory Visit (INDEPENDENT_AMBULATORY_CARE_PROVIDER_SITE_OTHER): Payer: BLUE CROSS/BLUE SHIELD | Admitting: Family Medicine

## 2014-12-14 VITALS — BP 148/70 | HR 63 | Temp 98.4°F | Ht 63.0 in | Wt 150.0 lb

## 2014-12-14 DIAGNOSIS — R928 Other abnormal and inconclusive findings on diagnostic imaging of breast: Secondary | ICD-10-CM

## 2014-12-14 DIAGNOSIS — E559 Vitamin D deficiency, unspecified: Secondary | ICD-10-CM

## 2014-12-14 DIAGNOSIS — E785 Hyperlipidemia, unspecified: Secondary | ICD-10-CM

## 2014-12-14 DIAGNOSIS — F411 Generalized anxiety disorder: Secondary | ICD-10-CM

## 2014-12-14 DIAGNOSIS — I10 Essential (primary) hypertension: Secondary | ICD-10-CM

## 2014-12-14 NOTE — Progress Notes (Signed)
Subjective:    Patient ID: Cindy Blake, female    DOB: 14-Sep-1956, 59 y.o.   MRN: [id number]  HPI Pt here for follow up and management of chronic medical problems. The patient has some slight head congestion and a cough.She has no specific complaints. Also it is important to note that the patient had a mammogram and November and December and is currently getting ready to have a biopsy. This will be done this Friday. She brings in blood pressures for review from no November and October. The blood pressures appear to be running lower in the morning and higher in the evening. In the morning they're in the 120s and 130s over the 60s in the evening this seemed to be more in the 130s and 140s. Recently there was one reading as high as 150 in the evening. Also it is important to note that her husband has a meningioma and is getting ready to have gamma knife stereotactic radiosurgery on this the end of the month. She is under a lot of stress.        Patient Active Problem List   Diagnosis Date Noted  . Generalized anxiety disorder 11/10/2013  . Allergic rhinitis 11/10/2013  . Hyperlipemia 07/06/2013  . Hypertension 07/06/2013  . Vitamin D deficiency 07/06/2013   Outpatient Encounter Prescriptions as of 12/14/2014  Medication Sig  . ALPRAZolam (XANAX) 1 MG tablet TAKE 1/2 TO 1 TABLET EVERY MORNING & BEDTIME AS NEEDED FOR ANXIETY & SLEEP  . aspirin 81 MG tablet Take 81 mg by mouth daily.  Marland Kitchen azelastine (ASTELIN) 137 MCG/SPRAY nasal spray Use in each nostril as directed 1-2 sprays  . Calcium Carbonate (CALCIUM 600 PO) Take 1 tablet by mouth daily.  . Cholecalciferol (VITAMIN D) 2000 UNITS CAPS Take 1 capsule by mouth daily.  Marland Kitchen estradiol (ESTRACE) 1 MG tablet Take 1 mg by mouth daily.  . fluticasone (FLONASE) 50 MCG/ACT nasal spray 1-2 sprays each nostril daily at bedtime as directed  . hydrochlorothiazide (HYDRODIURIL) 25 MG tablet TAKE 1 TABLET ONCE A DAY  . losartan (COZAAR) 50 MG tablet TAKE  1 TABLET DAILY  . Multiple Vitamin (MULTIVITAMIN) tablet Take 1 tablet by mouth daily.  . fexofenadine (ALLEGRA) 180 MG tablet Take 180 mg by mouth daily.     Review of Systems  Constitutional: Negative.   HENT: Positive for congestion (mostly nasal).   Eyes: Negative.   Respiratory: Negative.  Cough: slight.   Cardiovascular: Negative.   Gastrointestinal: Negative.   Endocrine: Negative.   Genitourinary: Negative.   Musculoskeletal: Negative.   Skin: Negative.   Allergic/Immunologic: Negative.   Neurological: Negative.   Hematological: Negative.   Psychiatric/Behavioral: Negative.        Objective:   Physical Exam  Constitutional: She is oriented to person, place, and time. She appears well-developed and well-nourished. She appears distressed.  The patient is alert and somewhat stressed by the activity going on in her life and her husband's life. Her mother-in-law passed away in 08-09-2023. Her husband is due for gamma knife surgery on a meningioma. She is scheduled for breast biopsy this Friday.  HENT:  Head: Normocephalic and atraumatic.  Right Ear: External ear normal.  Left Ear: External ear normal.  Mouth/Throat: Oropharynx is clear and moist. No oropharyngeal exudate.  Slightly increased nasal congestion  Eyes: Conjunctivae and EOM are normal. Pupils are equal, round, and reactive to light. Right eye exhibits no discharge. Left eye exhibits no discharge. No scleral icterus.  Neck: Normal range  of motion. Neck supple. No thyromegaly present.  Cardiovascular: Normal rate, regular rhythm, normal heart sounds and intact distal pulses.   No murmur heard. Pulmonary/Chest: Effort normal and breath sounds normal. No respiratory distress. She has no wheezes. She has no rales. She exhibits no tenderness.  The lungs are clear anteriorly and posteriorly  Abdominal: Soft. Bowel sounds are normal. She exhibits no mass. There is no tenderness. There is no rebound and no guarding.    Musculoskeletal: Normal range of motion. She exhibits no edema.  Lymphadenopathy:    She has no cervical adenopathy.  Neurological: She is alert and oriented to person, place, and time.  Skin: Skin is warm and dry.  Psychiatric: She has a normal mood and affect. Her behavior is normal. Judgment and thought content normal.  Nursing note and vitals reviewed.  BP 148/70 mmHg  Pulse 63  Temp(Src) 98.4 F (36.9 C) (Oral)  Ht 5\' 3"  (1.6 m)  Wt 150 lb (68.04 kg)  BMI 26.58 kg/m2        Assessment & Plan:  1. Hyperlipemia  2. Essential hypertension  3. Vitamin D deficiency  4. Abnormal mammogram of left breast  5. Anxiety state  Patient Instructions  Continue current medications. Continue good therapeutic lifestyle changes which include good diet and exercise. Fall precautions discussed with patient. If an FOBT was given today- please return it to our front desk. If you are over 71 years old - you may need Prevnar 25 or the adult Pneumonia vaccine.  Flu Shots will be available at our office starting mid- September. Please call and schedule a FLU CLINIC APPOINTMENT.   Review blood pressure readings in 4-6 weeks Continue to watch sodium intake Continue current lipid pressure medication Have the doctor's at [facility] copies of your breast biopsy studies.   Arrie Senate MD

## 2014-12-14 NOTE — Patient Instructions (Addendum)
Continue current medications. Continue good therapeutic lifestyle changes which include good diet and exercise. Fall precautions discussed with patient. If an FOBT was given today- please return it to our front desk. If you are over 59 years old - you may need Prevnar 66 or the adult Pneumonia vaccine.  Flu Shots will be available at our office starting mid- September. Please call and schedule a FLU CLINIC APPOINTMENT.   Review blood pressure readings in 4-6 weeks Continue to watch sodium intake Continue current lipid pressure medication Have the doctor's at [facility] copies of your breast biopsy studies.

## 2014-12-16 ENCOUNTER — Other Ambulatory Visit: Payer: Self-pay | Admitting: Family Medicine

## 2015-02-01 DIAGNOSIS — C50219 Malignant neoplasm of upper-inner quadrant of unspecified female breast: Secondary | ICD-10-CM | POA: Insufficient documentation

## 2015-02-03 ENCOUNTER — Encounter: Payer: Self-pay | Admitting: Family Medicine

## 2015-04-11 ENCOUNTER — Telehealth: Payer: Self-pay | Admitting: Family Medicine

## 2015-04-11 NOTE — Telephone Encounter (Signed)
[employer] just called to let us know that they spoke with Cindy Blake in regards to her recent hospital stay and benefits.

## 2015-04-26 ENCOUNTER — Ambulatory Visit (INDEPENDENT_AMBULATORY_CARE_PROVIDER_SITE_OTHER): Payer: BLUE CROSS/BLUE SHIELD | Admitting: Family Medicine

## 2015-04-26 ENCOUNTER — Encounter: Payer: Self-pay | Admitting: Family Medicine

## 2015-04-26 VITALS — BP 136/67 | HR 57 | Temp 97.2°F | Ht 63.0 in | Wt 149.0 lb

## 2015-04-26 DIAGNOSIS — I10 Essential (primary) hypertension: Secondary | ICD-10-CM

## 2015-04-26 DIAGNOSIS — C50912 Malignant neoplasm of unspecified site of left female breast: Secondary | ICD-10-CM | POA: Diagnosis not present

## 2015-04-26 DIAGNOSIS — E785 Hyperlipidemia, unspecified: Secondary | ICD-10-CM

## 2015-04-26 DIAGNOSIS — E559 Vitamin D deficiency, unspecified: Secondary | ICD-10-CM

## 2015-04-26 DIAGNOSIS — J309 Allergic rhinitis, unspecified: Secondary | ICD-10-CM | POA: Diagnosis not present

## 2015-04-26 MED ORDER — ALPRAZOLAM 1 MG PO TABS: ORAL_TABLET | ORAL | Status: DC

## 2015-04-26 MED ORDER — FLUTICASONE PROPIONATE 50 MCG/ACT NA SUSP: NASAL | Status: DC

## 2015-04-26 MED ORDER — HYDROCHLOROTHIAZIDE 25 MG PO TABS: ORAL_TABLET | ORAL | Status: DC

## 2015-04-26 MED ORDER — AZELASTINE HCL 0.1 % NA SOLN: NASAL | Status: DC

## 2015-04-26 MED ORDER — LOSARTAN POTASSIUM 50 MG PO TABS: ORAL_TABLET | ORAL | Status: DC

## 2015-04-26 NOTE — Progress Notes (Signed)
Subjective:    Patient ID: Cindy Blake, female    DOB: 1955-12-13, 59 y.o.   MRN: [id number]  HPI Pt here for follow up and management of chronic medical problems which includes hypertension, hyperlipidemia, and recent breast cancer. She is taking medications regularly. She had her last radiation treatment was on 04/15/15. She will follow up with oncologist on 05/16/15 and surgeon on 05/04/15. The brief story for this is as the patient had breast cancer and a lumpectomy with a sentinel node biopsy. All the nodes were negative. She has basically elected to pursue radiation treatment which she has completed and will go back to the medical oncologist for further evaluation soon to determine where she goes from here. The biggest issue is just healing from the radiation treatments at this time. The patient comes today with outside blood pressure readings from home and the majority of these are good. They will be scanned into the record. She denies chest pain shortness of breath GI symptoms or voiding symptoms. She is positive about starting a healthy exercise regimen and diet regimen.        Patient Active Problem List   Diagnosis Date Noted  . Generalized anxiety disorder 11/10/2013  . Allergic rhinitis 11/10/2013  . Hyperlipemia 07/06/2013  . Hypertension 07/06/2013  . Vitamin D deficiency 07/06/2013   Outpatient Encounter Prescriptions as of 04/26/2015  Medication Sig  . ALPRAZolam (XANAX) 1 MG tablet TAKE 1/2 TO 1 TABLET EVERY MORNING & BEDTIME AS NEEDED FOR ANXIETY & SLEEP  . aspirin 81 MG tablet Take 81 mg by mouth daily.  Marland Kitchen azelastine (ASTELIN) 137 MCG/SPRAY nasal spray Use in each nostril as directed 1-2 sprays  . Calcium Carbonate (CALCIUM 600 PO) Take 1 tablet by mouth daily.  . Cholecalciferol (VITAMIN D) 2000 UNITS CAPS Take 1 capsule by mouth daily.  . fluticasone (FLONASE) 50 MCG/ACT nasal spray USE 1 TO 2 SPRAYS IN EACH NOSTRIL ONCE DAILY AT BEDTIME  . hydrochlorothiazide  (HYDRODIURIL) 25 MG tablet TAKE 1 TABLET ONCE A DAY  . losartan (COZAAR) 50 MG tablet TAKE 1 TABLET DAILY  . Multiple Vitamin (MULTIVITAMIN) tablet Take 1 tablet by mouth daily.  . [DISCONTINUED] estradiol (ESTRACE) 1 MG tablet Take 1 mg by mouth daily.  . fexofenadine (ALLEGRA) 180 MG tablet Take 180 mg by mouth daily.   No facility-administered encounter medications on file as of 04/26/2015.     Review of Systems  Constitutional: Negative.   HENT: Negative.   Eyes: Negative.   Respiratory: Negative.   Cardiovascular: Negative.   Gastrointestinal: Negative.   Endocrine: Negative.   Genitourinary: Negative.   Musculoskeletal: Negative.   Skin: Negative.   Allergic/Immunologic: Negative.   Neurological: Negative.   Hematological: Negative.   Psychiatric/Behavioral: Negative.        Objective:   Physical Exam  Constitutional: She is oriented to person, place, and time. She appears well-developed and well-nourished. No distress.  The patient is alert and positive following her lumpectomy radiation treatments with breast cancer this winter.  HENT:  Head: Normocephalic and atraumatic.  Right Ear: External ear normal.  Left Ear: External ear normal.  Nose: Nose normal.  Mouth/Throat: Oropharynx is clear and moist. No oropharyngeal exudate.  Eyes: Conjunctivae and EOM are normal. Pupils are equal, round, and reactive to light. Right eye exhibits no discharge. Left eye exhibits no discharge. No scleral icterus.  Neck: Normal range of motion. Neck supple. No thyromegaly present.  No anterior cervical adenopathy  Cardiovascular: Normal rate,  regular rhythm, normal heart sounds and intact distal pulses.  Exam reveals no friction rub.   No murmur heard. The rhythm is regular at 60/m  Pulmonary/Chest: Effort normal and breath sounds normal. No respiratory distress. She has no wheezes. She has no rales. She exhibits no tenderness.  Clear anteriorly and posteriorly  Abdominal: Soft.  Bowel sounds are normal. She exhibits no mass. There is no tenderness. There is no rebound and no guarding.  No abdominal tenderness or organ enlargement or inguinal adenopathy  Musculoskeletal: Normal range of motion. She exhibits no edema or tenderness.  Lymphadenopathy:    She has no cervical adenopathy.  Neurological: She is alert and oriented to person, place, and time.  Skin: Skin is warm and dry. No rash noted.  Psychiatric: She has a normal mood and affect. Her behavior is normal. Judgment and thought content normal.  Nursing note and vitals reviewed.  BP 136/67 mmHg  Pulse 57  Temp(Src) 97.2 F (36.2 C) (Oral)  Ht _0  (1.6 m)  Wt 149 lb (67.586 kg)  BMI 26.40 kg/m2        Assessment & Plan:  1. Essential hypertension -The in office blood pressure was good today and most of the readings that she brought from home were good and she will continue with her current treatment. - POCT CBC; Future - BMP8+EGFR; Future - Hepatic function panel; Future  2. Hyperlipemia -She will continue to watch her diet closely and eat healthy and we will decide about any further treatment for cholesterol when the lab work is returned. - POCT CBC; Future - NMR, lipoprofile; Future  3. Vitamin D deficiency -She will continue her current vitamin D pending results of lab work - POCT CBC; Future - Vit D  25 hydroxy (rtn osteoporosis monitoring); Future  4. Allergic rhinitis, unspecified allergic rhinitis type -She will continue with her Astelin nose spray and avoid irritating environments as much as possible - azelastine (ASTELIN) 0.1 % nasal spray; Use in each nostril as directed 1-2 sprays  Dispense: 30 mL; Refill: 12  5. Breast cancer, left -She will follow-up with her surgeon and oncologists as planned  Meds ordered this encounter  Medications  . losartan (COZAAR) 50 MG tablet    Sig: TAKE 1 TABLET DAILY    Dispense:  30 tablet    Refill:  6  . hydrochlorothiazide (HYDRODIURIL)  25 MG tablet    Sig: TAKE 1 TABLET ONCE A DAY    Dispense:  30 tablet    Refill:  6  . ALPRAZolam (XANAX) 1 MG tablet    Sig: TAKE 1/2 TO 1 TABLET EVERY MORNING & BEDTIME AS NEEDED FOR ANXIETY & SLEEP    Dispense:  45 tablet    Refill:  2  . azelastine (ASTELIN) 0.1 % nasal spray    Sig: Use in each nostril as directed 1-2 sprays    Dispense:  30 mL    Refill:  12  . fluticasone (FLONASE) 50 MCG/ACT nasal spray    Sig: USE 1 TO 2 SPRAYS IN EACH NOSTRIL ONCE DAILY AT BEDTIME    Dispense:  16 g    Refill:  11   Patient Instructions  Continue current medications. Continue good therapeutic lifestyle changes which include good diet and exercise. Fall precautions discussed with patient. If an FOBT was given today- please return it to our front desk. If you are over 35 years old - you may need Prevnar 42 or the adult Pneumonia vaccine.  Flu  Shots are still available at our office. If you still haven't had one please call to set up a nurse visit to get one.   After your visit with [name] today you will receive a survey in the mail or online from [employer] regarding your care with [name]. Please take a moment to fill this out. Your feedback is very important to [name] as you can help [name] better understand your patient needs as well as improve your experience and satisfaction. WE CARE ABOUT YOU!!!   The patient should continue to follow-up with her radiation oncologist and medical oncologist as well as following up with the surgeon who did the lumpectomy. If we can be of any help in any way please feel free to let us know When you get your lab work here we will call you as soon as the results become available Stay active and practices healthy lifestyles and take good care of yourself----exercise regularly as they have directed and drink plenty of water   Arrie Senate MD

## 2015-04-26 NOTE — Patient Instructions (Addendum)
Continue current medications. Continue good therapeutic lifestyle changes which include good diet and exercise. Fall precautions discussed with patient. If an FOBT was given today- please return it to our front desk. If you are over 59 years old - you may need Prevnar 9 or the adult Pneumonia vaccine.  Flu Shots are still available at our office. If you still haven't had one please call to set up a nurse visit to get one.   After your visit with [name] today you will receive a survey in the mail or online from [employer] regarding your care with [name]. Please take a moment to fill this out. Your feedback is very important to [name] as you can help [name] better understand your patient needs as well as improve your experience and satisfaction. WE CARE ABOUT YOU!!!   The patient should continue to follow-up with her radiation oncologist and medical oncologist as well as following up with the surgeon who did the lumpectomy. If we can be of any help in any way please feel free to let us know When you get your lab work here we will call you as soon as the results become available Stay active and practices healthy lifestyles and take good care of yourself----exercise regularly as they have directed and drink plenty of water

## 2015-05-10 ENCOUNTER — Encounter: Payer: Self-pay | Admitting: *Deleted

## 2015-05-12 ENCOUNTER — Other Ambulatory Visit (INDEPENDENT_AMBULATORY_CARE_PROVIDER_SITE_OTHER): Payer: BLUE CROSS/BLUE SHIELD

## 2015-05-12 DIAGNOSIS — E785 Hyperlipidemia, unspecified: Secondary | ICD-10-CM | POA: Diagnosis not present

## 2015-05-12 DIAGNOSIS — Z1212 Encounter for screening for malignant neoplasm of rectum: Secondary | ICD-10-CM | POA: Diagnosis not present

## 2015-05-12 DIAGNOSIS — I10 Essential (primary) hypertension: Secondary | ICD-10-CM | POA: Diagnosis not present

## 2015-05-12 DIAGNOSIS — E559 Vitamin D deficiency, unspecified: Secondary | ICD-10-CM

## 2015-05-12 LAB — POCT CBC
Granulocyte percent: 77.7 %G (ref 37–80)
HCT, POC: 40.3 % (ref 37.7–47.9)
Hemoglobin: 12.9 g/dL (ref 12.2–16.2)
Lymph, poc: 0.9 (ref 0.6–3.4)
MCH, POC: 28.8 pg (ref 27–31.2)
MCHC: 32.1 g/dL (ref 31.8–35.4)
MCV: 89.6 fL (ref 80–97)
MPV: 8.3 fL (ref 0–99.8)
POC GRANULOCYTE: 3.6 (ref 2–6.9)
POC LYMPH PERCENT: 19.5 %L (ref 10–50)
Platelet Count, POC: 218 10*3/uL (ref 142–424)
RBC: 4.5 M/uL (ref 4.04–5.48)
RDW, POC: 12.7 %
WBC: 4.6 10*3/uL (ref 4.6–10.2)

## 2015-05-12 NOTE — Addendum Note (Signed)
Addended by: Pollyann Kennedy F on: 05/12/2015 11:04 AM   Modules accepted: Orders

## 2015-05-12 NOTE — Progress Notes (Signed)
Lab only 

## 2015-05-13 LAB — BMP8+EGFR
BUN / CREAT RATIO: 21 (ref 9–23)
BUN: 15 mg/dL (ref 6–24)
CALCIUM: 9.1 mg/dL (ref 8.7–10.2)
CHLORIDE: 99 mmol/L (ref 97–108)
CO2: 25 mmol/L (ref 18–29)
Creatinine, Ser: 0.71 mg/dL (ref 0.57–1.00)
GFR calc Af Amer: 109 mL/min/{1.73_m2} (ref >59)
GFR calc non Af Amer: 94 mL/min/{1.73_m2} (ref >59)
Glucose: 88 mg/dL (ref 65–99)
Potassium: 3.5 mmol/L (ref 3.5–5.2)
Sodium: 142 mmol/L (ref 134–144)

## 2015-05-13 LAB — HEPATIC FUNCTION PANEL
ALK PHOS: 96 IU/L (ref 39–117)
ALT: 22 IU/L (ref 0–32)
AST: 26 IU/L (ref 0–40)
Albumin: 4.6 g/dL (ref 3.5–5.5)
BILIRUBIN, DIRECT: 0.14 mg/dL (ref 0.00–0.40)
Bilirubin Total: 0.5 mg/dL (ref 0.0–1.2)
TOTAL PROTEIN: 6.7 g/dL (ref 6.0–8.5)

## 2015-05-13 LAB — LIPID PANEL
Chol/HDL Ratio: 2.3 ratio units (ref 0.0–4.4)
Cholesterol, Total: 225 mg/dL — ABNORMAL HIGH (ref 100–199)
HDL: 97 mg/dL (ref >39)
LDL Calculated: 117 mg/dL — ABNORMAL HIGH (ref 0–99)
TRIGLYCERIDES: 57 mg/dL (ref 0–149)
VLDL Cholesterol Cal: 11 mg/dL (ref 5–40)

## 2015-05-13 LAB — FECAL OCCULT BLOOD, IMMUNOCHEMICAL: Fecal Occult Bld: NEGATIVE

## 2015-05-13 LAB — VITAMIN D 25 HYDROXY (VIT D DEFICIENCY, FRACTURES): VIT D 25 HYDROXY: 45.7 ng/mL (ref 30.0–100.0)

## 2015-08-30 ENCOUNTER — Encounter: Payer: Self-pay | Admitting: Family Medicine

## 2015-08-30 ENCOUNTER — Ambulatory Visit (INDEPENDENT_AMBULATORY_CARE_PROVIDER_SITE_OTHER): Payer: BLUE CROSS/BLUE SHIELD | Admitting: Family Medicine

## 2015-08-30 VITALS — BP 117/64 | HR 60 | Temp 98.2°F | Ht 63.0 in | Wt 151.0 lb

## 2015-08-30 DIAGNOSIS — I10 Essential (primary) hypertension: Secondary | ICD-10-CM | POA: Diagnosis not present

## 2015-08-30 DIAGNOSIS — J301 Allergic rhinitis due to pollen: Secondary | ICD-10-CM

## 2015-08-30 DIAGNOSIS — E559 Vitamin D deficiency, unspecified: Secondary | ICD-10-CM | POA: Diagnosis not present

## 2015-08-30 DIAGNOSIS — C50912 Malignant neoplasm of unspecified site of left female breast: Secondary | ICD-10-CM

## 2015-08-30 DIAGNOSIS — E785 Hyperlipidemia, unspecified: Secondary | ICD-10-CM

## 2015-08-30 NOTE — Patient Instructions (Addendum)
Continue current medications. Continue good therapeutic lifestyle changes which include good diet and exercise. Fall precautions discussed with patient. If an FOBT was given today- please return it to our front desk. If you are over 59 years old - you may need Prevnar 5 or the adult Pneumonia vaccine.  **Flu shots will be available soon--- please call and schedule a FLU-CLINIC appointment**  After your visit with [name] today you will receive a survey in the mail or online from [employer] regarding your care with [name]. Please take a moment to fill this out. Your feedback is very important to [name] as you can help [name] better understand your patient needs as well as improve your experience and satisfaction. WE CARE ABOUT YOU!!!   **Please join [name] SEPT.[email or web address], 2016 from 5:00 to 7:00pm for our OPEN HOUSE! Come out and meet our NEW providers**  Follow-up with oncology and surgeon as planned Get mammogram as planned Continue with Flonase and Astelin nasal spray and add to this some nasal saline use frequently during the day Avoid irritating environments and wear a mask if necessary Drink plenty of water. If you continue to have problems with this occasional heartburn you may want to pick up some Zantac 150 and take this twice daily before breakfast and supper for couple weeks and at the same time continue to avoid spicy foods and acidic foods and alcohol and NSAIDs.

## 2015-08-30 NOTE — Progress Notes (Signed)
Subjective:    Patient ID: Cindy Blake, female    DOB: 01-Aug-1956, 59 y.o.   MRN: [id number]  HPI Pt here for follow up and management of chronic medical problems which includes left breast cancer, hypertension and hyperlipidemia. She is taking medications regularly. The patient is doing well following her breast cancer and this is being monitored by doctors in [place]. She is currently on tamoxifen. He does complain today of sinus congestion allergic rhinitis symptoms and seasonal allergies. According to the record she is currently using Flonase and Astelin nasal spray. Outside blood pressures for the month of August were reviewed and for September and they are all very good. The patient follows up regularly and has appointments with both the oncologist and the surgical Dr. coming up later this fall. She has a mammogram planned for November. She sees the oncologist in December. She denies chest pain or shortness of breath. She does have occasional heartburn which seems to get better just with belching. She has had no blood in the stool or black tarry bowel movements. She denies any problems with voiding. She has not had any headaches or dizziness other than those associated with her sinus congestion.      Patient Active Problem List   Diagnosis Date Noted  . Cancer of upper-inner quadrant of female breast 02/01/2015  . Generalized anxiety disorder 11/10/2013  . Allergic rhinitis 11/10/2013  . Hyperlipemia 07/06/2013  . Hypertension 07/06/2013  . Vitamin D deficiency 07/06/2013   Outpatient Encounter Prescriptions as of 08/30/2015  Medication Sig  . ALPRAZolam (XANAX) 1 MG tablet TAKE 1/2 TO 1 TABLET EVERY MORNING & BEDTIME AS NEEDED FOR ANXIETY & SLEEP  . aspirin 81 MG tablet Take 81 mg by mouth daily.  Marland Kitchen azelastine (ASTELIN) 0.1 % nasal spray Use in each nostril as directed 1-2 sprays  . Calcium Carbonate (CALCIUM 600 PO) Take 1 tablet by mouth daily.  . Cholecalciferol (VITAMIN  D) 2000 UNITS CAPS Take 1 capsule by mouth daily.  . fexofenadine (ALLEGRA) 180 MG tablet Take 180 mg by mouth daily.  . fluticasone (FLONASE) 50 MCG/ACT nasal spray USE 1 TO 2 SPRAYS IN EACH NOSTRIL ONCE DAILY AT BEDTIME  . hydrochlorothiazide (HYDRODIURIL) 25 MG tablet TAKE 1 TABLET ONCE A DAY  . losartan (COZAAR) 50 MG tablet TAKE 1 TABLET DAILY  . Multiple Vitamin (MULTIVITAMIN) tablet Take 1 tablet by mouth daily.  . tamoxifen (NOLVADEX) 20 MG tablet Take 20 mg by mouth daily.   No facility-administered encounter medications on file as of 08/30/2015.      Review of Systems  Constitutional: Negative.   HENT: Positive for sinus pressure.        Seasonal allergies  Eyes: Negative.   Respiratory: Negative.   Cardiovascular: Negative.   Gastrointestinal: Negative.   Endocrine: Negative.   Genitourinary: Negative.   Musculoskeletal: Negative.   Skin: Negative.   Allergic/Immunologic: Negative.   Neurological: Negative.   Hematological: Negative.   Psychiatric/Behavioral: Negative.        Objective:   Physical Exam  Constitutional: She is oriented to person, place, and time. She appears well-developed and well-nourished. No distress.  HENT:  Head: Normocephalic and atraumatic.  Right Ear: External ear normal.  Left Ear: External ear normal.  Mouth/Throat: Oropharynx is clear and moist.  Nasal congestion and turbinate swelling bilaterally  Eyes: Conjunctivae and EOM are normal. Pupils are equal, round, and reactive to light. Right eye exhibits no discharge. Left eye exhibits no discharge. No scleral  icterus.  Neck: Normal range of motion. Neck supple. No thyromegaly present.  No anterior cervical or posterior cervical nodes and no bruits. No thyromegaly.  Cardiovascular: Normal rate, regular rhythm, normal heart sounds and intact distal pulses.   No murmur heard. The heart has a regular rate and rhythm at 60/m  Pulmonary/Chest: Effort normal and breath sounds normal. No  respiratory distress. She has no wheezes. She has no rales. She exhibits no tenderness.  Clear anteriorly and posteriorly  Abdominal: Soft. Bowel sounds are normal. She exhibits no mass. There is no tenderness. There is no rebound and no guarding.  Nontender without masses or organ enlargement or bruits  Musculoskeletal: Normal range of motion. She exhibits no edema or tenderness.  The patient is wearing an elastic sleeve on her left arm because of her breast cancer surgery. Otherwise there is no edema.  Lymphadenopathy:    She has no cervical adenopathy.  Neurological: She is alert and oriented to person, place, and time. She has normal reflexes. No cranial nerve deficit.  Skin: Skin is warm and dry. No rash noted.  Psychiatric: She has a normal mood and affect. Her behavior is normal. Judgment and thought content normal.  The patient's mood is positive.  Nursing note and vitals reviewed.   BP 117/64 mmHg  Pulse 60  Temp(Src) 98.2 F (36.8 C) (Oral)  Ht _0  (1.6 m)  Wt 151 lb (68.493 kg)  BMI 26.76 kg/m2       Assessment & Plan:  1. Essential hypertension -Blood pressure is good today and she will continue with her low [place] - BMP8+EGFR - CBC with Differential/Platelet - Hepatic function panel  2. Hyperlipemia -She will continue with aggressive therapeutic lifestyle changes to continue to help control her lipids pending results of lab work - CBC with Differential/Platelet - Lipid panel  3. Vitamin D deficiency -She will continue with current vitamin D dosing pending results of lab work - CBC with Differential/Platelet - Vit D  25 hydroxy (rtn osteoporosis monitoring)  4. Breast cancer, left -She follows up regularly with the oncologist about every 3 months and has an upcoming appointment soon with her breast surgeon. -She will get her mammogram again in November - CBC with Differential/Platelet  5. Allergic rhinitis due to pollen -She will continue to use the  Flonase and Astelin nasal spray and we'll add to this nasal saline for further protection.- -She will continue to avoid irritating environments -If she develops a cough she will take Mucinex for the cough and information regarding the nasal spray and Mucinex were given to her before she left the office.  Patient Instructions  Continue current medications. Continue good therapeutic lifestyle changes which include good diet and exercise. Fall precautions discussed with patient. If an FOBT was given today- please return it to our front desk. If you are over 57 years old - you may need Prevnar 61 or the adult Pneumonia vaccine.  **Flu shots will be available soon--- please call and schedule a FLU-CLINIC appointment**  After your visit with [name] today you will receive a survey in the mail or online from [employer] regarding your care with [name]. Please take a moment to fill this out. Your feedback is very important to [name] as you can help [name] better understand your patient needs as well as improve your experience and satisfaction. WE CARE ABOUT YOU!!!   **Please join [name] SEPT.22, 2016 from 5:00 to 7:00pm for our OPEN HOUSE! Come out and meet our NEW  providers**  Follow-up with oncology and surgeon as planned Get mammogram as planned Continue with Flonase and Astelin nasal spray and add to this some nasal saline use frequently during the day Avoid irritating environments and wear a mask if necessary Drink plenty of water. If you continue to have problems with this occasional heartburn you may want to pick up some Zantac 150 and take this twice daily before breakfast and supper for couple weeks and at the same time continue to avoid spicy foods and acidic foods and alcohol and NSAIDs.   Arrie Senate MD

## 2015-08-31 LAB — CBC WITH DIFFERENTIAL/PLATELET
BASOS ABS: 0 10*3/uL (ref 0.0–0.2)
Basos: 1 %
EOS (ABSOLUTE): 0.2 10*3/uL (ref 0.0–0.4)
Eos: 3 %
Hematocrit: 37.1 % (ref 34.0–46.6)
Hemoglobin: 12.7 g/dL (ref 11.1–15.9)
IMMATURE GRANS (ABS): 0 10*3/uL (ref 0.0–0.1)
IMMATURE GRANULOCYTES: 0 %
LYMPHS: 30 %
Lymphocytes Absolute: 1.4 10*3/uL (ref 0.7–3.1)
MCH: 30.1 pg (ref 26.6–33.0)
MCHC: 34.2 g/dL (ref 31.5–35.7)
MCV: 88 fL (ref 79–97)
Monocytes Absolute: 0.4 10*3/uL (ref 0.1–0.9)
Monocytes: 9 %
NEUTROS PCT: 57 %
Neutrophils Absolute: 2.6 10*3/uL (ref 1.4–7.0)
PLATELETS: 208 10*3/uL (ref 150–379)
RBC: 4.22 x10E6/uL (ref 3.77–5.28)
RDW: 13.2 % (ref 12.3–15.4)
WBC: 4.6 10*3/uL (ref 3.4–10.8)

## 2015-08-31 LAB — HEPATIC FUNCTION PANEL
ALT: 10 IU/L (ref 0–32)
AST: 15 IU/L (ref 0–40)
Albumin: 4.2 g/dL (ref 3.5–5.5)
Alkaline Phosphatase: 67 IU/L (ref 39–117)
BILIRUBIN, DIRECT: 0.07 mg/dL (ref 0.00–0.40)
TOTAL PROTEIN: 6.3 g/dL (ref 6.0–8.5)

## 2015-08-31 LAB — BMP8+EGFR
BUN/Creatinine Ratio: 20 (ref 9–23)
BUN: 15 mg/dL (ref 6–24)
CALCIUM: 8.8 mg/dL (ref 8.7–10.2)
CHLORIDE: 99 mmol/L (ref 97–108)
CO2: 27 mmol/L (ref 18–29)
Creatinine, Ser: 0.76 mg/dL (ref 0.57–1.00)
GFR calc Af Amer: 99 mL/min/{1.73_m2} (ref >59)
GFR, EST NON AFRICAN AMERICAN: 86 mL/min/{1.73_m2} (ref >59)
Glucose: 87 mg/dL (ref 65–99)
POTASSIUM: 3.2 mmol/L — AB (ref 3.5–5.2)
Sodium: 143 mmol/L (ref 134–144)

## 2015-08-31 LAB — VITAMIN D 25 HYDROXY (VIT D DEFICIENCY, FRACTURES): VIT D 25 HYDROXY: 46.2 ng/mL (ref 30.0–100.0)

## 2015-08-31 LAB — LIPID PANEL
Chol/HDL Ratio: 2.6 ratio units (ref 0.0–4.4)
Cholesterol, Total: 194 mg/dL (ref 100–199)
HDL: 74 mg/dL (ref >39)
LDL CALC: 94 mg/dL (ref 0–99)
Triglycerides: 129 mg/dL (ref 0–149)
VLDL CHOLESTEROL CAL: 26 mg/dL (ref 5–40)

## 2015-09-06 ENCOUNTER — Ambulatory Visit: Payer: BLUE CROSS/BLUE SHIELD | Admitting: Family Medicine

## 2015-09-20 ENCOUNTER — Other Ambulatory Visit (INDEPENDENT_AMBULATORY_CARE_PROVIDER_SITE_OTHER): Payer: BLUE CROSS/BLUE SHIELD

## 2015-09-20 DIAGNOSIS — E876 Hypokalemia: Secondary | ICD-10-CM | POA: Diagnosis not present

## 2015-09-20 NOTE — Progress Notes (Signed)
Lab only 

## 2015-09-21 LAB — POTASSIUM: POTASSIUM: 4 mmol/L (ref 3.5–5.2)

## 2015-12-28 ENCOUNTER — Ambulatory Visit (INDEPENDENT_AMBULATORY_CARE_PROVIDER_SITE_OTHER): Payer: Managed Care, Other (non HMO)

## 2015-12-28 ENCOUNTER — Ambulatory Visit (INDEPENDENT_AMBULATORY_CARE_PROVIDER_SITE_OTHER): Payer: Managed Care, Other (non HMO) | Admitting: Family Medicine

## 2015-12-28 ENCOUNTER — Encounter: Payer: Self-pay | Admitting: Family Medicine

## 2015-12-28 VITALS — BP 113/73 | HR 67 | Temp 97.3°F | Ht 63.0 in | Wt 144.0 lb

## 2015-12-28 DIAGNOSIS — E559 Vitamin D deficiency, unspecified: Secondary | ICD-10-CM | POA: Diagnosis not present

## 2015-12-28 DIAGNOSIS — I10 Essential (primary) hypertension: Secondary | ICD-10-CM

## 2015-12-28 DIAGNOSIS — C50912 Malignant neoplasm of unspecified site of left female breast: Secondary | ICD-10-CM | POA: Diagnosis not present

## 2015-12-28 DIAGNOSIS — J301 Allergic rhinitis due to pollen: Secondary | ICD-10-CM | POA: Diagnosis not present

## 2015-12-28 DIAGNOSIS — E785 Hyperlipidemia, unspecified: Secondary | ICD-10-CM | POA: Diagnosis not present

## 2015-12-28 NOTE — Progress Notes (Signed)
Subjective:    Patient ID: Cindy Blake, female    DOB: Sep 27, 1956, 60 y.o.   MRN: [id number]  HPI Pt here for follow up and management of chronic medical problems which includes hypertension. She is taking medications regularly. The patient is doing well overall she's had some sinus problems in the same to be getting better currently. She still being followed by her breast surgeon and oncologist for her breast cancer. She brings in blood pressures for review and these will be scanned into the record all of these are good except for maybe one that is over 892 at 119 systolic. The blood pressure today here was good. The patient denies chest pain shortness of breath trouble swallowing but does have occasional heartburn and takes Zantac for this. She's not seen any blood in the stool or had any black tarry bowel movements. Her water without problems. She is currently taking tamoxifen and will change to an aromatase inhibitor into a half years. She feels good about where she is and is thankful that she's had good results so far.      Patient Active Problem List   Diagnosis Date Noted  . Cancer of upper-inner quadrant of female breast ([place]) 02/01/2015  . Generalized anxiety disorder 11/10/2013  . Allergic rhinitis 11/10/2013  . Hyperlipemia 07/06/2013  . Hypertension 07/06/2013  . Vitamin D deficiency 07/06/2013   Outpatient Encounter Prescriptions as of 12/28/2015  Medication Sig  . ALPRAZolam (XANAX) 1 MG tablet TAKE 1/2 TO 1 TABLET EVERY MORNING & BEDTIME AS NEEDED FOR ANXIETY & SLEEP  . aspirin 81 MG tablet Take 81 mg by mouth daily.  Marland Kitchen azelastine (ASTELIN) 0.1 % nasal spray Use in each nostril as directed 1-2 sprays  . Calcium Carbonate (CALCIUM 600 PO) Take 1 tablet by mouth daily.  . Cholecalciferol (VITAMIN D) 2000 UNITS CAPS Take 1 capsule by mouth daily.  . fluticasone (FLONASE) 50 MCG/ACT nasal spray USE 1 TO 2 SPRAYS IN EACH NOSTRIL ONCE DAILY AT BEDTIME  . hydrochlorothiazide  (HYDRODIURIL) 25 MG tablet TAKE 1 TABLET ONCE A DAY  . losartan (COZAAR) 50 MG tablet TAKE 1 TABLET DAILY  . Multiple Vitamin (MULTIVITAMIN) tablet Take 1 tablet by mouth daily.  . tamoxifen (NOLVADEX) 20 MG tablet Take 20 mg by mouth daily.  . fexofenadine (ALLEGRA) 180 MG tablet Take 180 mg by mouth daily. Reported on 12/28/2015   No facility-administered encounter medications on file as of 12/28/2015.      Review of Systems  Constitutional: Negative.   HENT: Positive for sinus pressure (getting better).   Eyes: Negative.   Respiratory: Negative.   Cardiovascular: Negative.   Gastrointestinal: Negative.   Endocrine: Negative.   Genitourinary: Negative.   Musculoskeletal: Negative.   Skin: Negative.   Allergic/Immunologic: Negative.   Neurological: Negative.   Hematological: Negative.   Psychiatric/Behavioral: Negative.        Objective:   Physical Exam  Constitutional: She is oriented to person, place, and time. She appears well-developed and well-nourished. No distress.  HENT:  Head: Normocephalic and atraumatic.  Right Ear: External ear normal.  Left Ear: External ear normal.  Mouth/Throat: Oropharynx is clear and moist.  Some nasal congestion bilaterally  Eyes: Conjunctivae and EOM are normal. Pupils are equal, round, and reactive to light. Right eye exhibits no discharge. Left eye exhibits no discharge. No scleral icterus.  Neck: Normal range of motion. Neck supple. No thyromegaly present.  No thyromegaly or anterior cervical adenopathy or bruits  Cardiovascular: Normal  rate, regular rhythm, normal heart sounds and intact distal pulses.   No murmur heard. Heart is regular at 72/m  Pulmonary/Chest: Effort normal and breath sounds normal. No respiratory distress. She has no wheezes. She has no rales. She exhibits no tenderness.  Clear anteriorly and posteriorly  Abdominal: Soft. Bowel sounds are normal. She exhibits no mass. There is no tenderness. There is no rebound  and no guarding.  Abdomen is nontender without masses or organ enlargement or bruits  Musculoskeletal: Normal range of motion. She exhibits no edema.  Lymphadenopathy:    She has no cervical adenopathy.  Neurological: She is alert and oriented to person, place, and time. She has normal reflexes. No cranial nerve deficit.  Skin: Skin is warm and dry. No rash noted.  Psychiatric: She has a normal mood and affect. Her behavior is normal. Judgment and thought content normal.  Nursing note and vitals reviewed.  BP 113/73 mmHg  Pulse 67  Temp(Src) 97.3 F (36.3 C) (Oral)  Ht '5\' 3"'$  (1.6 m)  Wt 144 lb (65.318 kg)  BMI 25.51 kg/m2  [facility] reading (PRIMARY) by  Dr. Brunilda Payor x-ray--no active disease                                        Assessment & Plan:  1. Vitamin D deficiency -Continue current treatment pending results of lab work - CBC with Differential/Platelet - VITAMIN D 25 Hydroxy (Vit-D Deficiency, Fractures)  2. Essential hypertension -The blood pressure is good today in the home readings were also good and she will continue with current treatment - BMP8+EGFR - CBC with Differential/Platelet - Hepatic function panel - DG Chest 2 View; Future  3. Hyperlipemia -Continue with aggressive therapeutic lifestyle changes and healthy eating habits and diet - Lipid panel - CBC with Differential/Platelet  4. Malignant neoplasm of left female breast, unspecified site of breast [facility]) -He needs to follow-up with oncologist, Dr. Derrel Nip - CBC with Differential/Platelet  5. Allergic rhinitis due to pollen, unspecified rhinitis seasonality -Continue with Flonase nasal saline and Allegra and avoid sedating antihistamines as much as possible  Patient Instructions  Continue current medications. Continue good therapeutic lifestyle changes which include good diet and exercise. Fall precautions discussed with patient. If an FOBT was given today- please return it to our front desk. If  you are over 34 years old - you may need Prevnar 59 or the adult Pneumonia vaccine.  **Flu shots are available--- please call and schedule a FLU-CLINIC appointment**  After your visit with [name] today you will receive a survey in the mail or online from [employer] regarding your care with [name]. Please take a moment to fill this out. Your feedback is very important to [name] as you can help [name] better understand your patient needs as well as improve your experience and satisfaction. WE CARE ABOUT YOU!!!   The patient should continue to follow-up with her oncologist regularly She should make sure that she takes a copy of her lab work that redoing today and her chest x-ray report with her to see the oncologist at the next visit She should reduce her use of sedating antihistamines and stick with those that are less sedating This winter she should use nasal saline and Mucinex and cool mist humidification and keep the house as cool as possible   Arrie Senate MD

## 2015-12-28 NOTE — Patient Instructions (Addendum)
Continue current medications. Continue good therapeutic lifestyle changes which include good diet and exercise. Fall precautions discussed with patient. If an FOBT was given today- please return it to our front desk. If you are over 60 years old - you may need Prevnar 58 or the adult Pneumonia vaccine.  **Flu shots are available--- please call and schedule a FLU-CLINIC appointment**  After your visit with [name] today you will receive a survey in the mail or online from [employer] regarding your care with [name]. Please take a moment to fill this out. Your feedback is very important to [name] as you can help [name] better understand your patient needs as well as improve your experience and satisfaction. WE CARE ABOUT YOU!!!   The patient should continue to follow-up with her oncologist regularly She should make sure that she takes a copy of her lab work that redoing today and her chest x-ray report with her to see the oncologist at the next visit She should reduce her use of sedating antihistamines and stick with those that are less sedating This winter she should use nasal saline and Mucinex and cool mist humidification and keep the house as cool as possible

## 2015-12-29 LAB — BMP8+EGFR
BUN / CREAT RATIO: 18 (ref 9–23)
BUN: 12 mg/dL (ref 6–24)
CHLORIDE: 97 mmol/L (ref 96–106)
CO2: 25 mmol/L (ref 18–29)
CREATININE: 0.67 mg/dL (ref 0.57–1.00)
Calcium: 9.2 mg/dL (ref 8.7–10.2)
GFR calc non Af Amer: 97 mL/min/{1.73_m2} (ref >59)
GFR, EST AFRICAN AMERICAN: 111 mL/min/{1.73_m2} (ref >59)
GLUCOSE: 88 mg/dL (ref 65–99)
Potassium: 3.9 mmol/L (ref 3.5–5.2)
SODIUM: 140 mmol/L (ref 134–144)

## 2015-12-29 LAB — LIPID PANEL
CHOL/HDL RATIO: 2.4 ratio (ref 0.0–4.4)
Cholesterol, Total: 207 mg/dL — ABNORMAL HIGH (ref 100–199)
HDL: 88 mg/dL (ref >39)
LDL Calculated: 105 mg/dL — ABNORMAL HIGH (ref 0–99)
TRIGLYCERIDES: 72 mg/dL (ref 0–149)
VLDL CHOLESTEROL CAL: 14 mg/dL (ref 5–40)

## 2015-12-29 LAB — HEPATIC FUNCTION PANEL
ALK PHOS: 61 IU/L (ref 39–117)
ALT: 12 IU/L (ref 0–32)
AST: 16 IU/L (ref 0–40)
Albumin: 4.6 g/dL (ref 3.5–5.5)
Bilirubin Total: 0.2 mg/dL (ref 0.0–1.2)
Bilirubin, Direct: 0.09 mg/dL (ref 0.00–0.40)
Total Protein: 6.8 g/dL (ref 6.0–8.5)

## 2015-12-29 LAB — CBC WITH DIFFERENTIAL/PLATELET
Basophils Absolute: 0.1 10*3/uL (ref 0.0–0.2)
Basos: 1 %
EOS (ABSOLUTE): 0.1 10*3/uL (ref 0.0–0.4)
EOS: 3 %
HEMATOCRIT: 38.3 % (ref 34.0–46.6)
HEMOGLOBIN: 13 g/dL (ref 11.1–15.9)
Immature Grans (Abs): 0 10*3/uL (ref 0.0–0.1)
Immature Granulocytes: 0 %
LYMPHS ABS: 1.5 10*3/uL (ref 0.7–3.1)
Lymphs: 32 %
MCH: 30 pg (ref 26.6–33.0)
MCHC: 33.9 g/dL (ref 31.5–35.7)
MCV: 89 fL (ref 79–97)
MONOCYTES: 10 %
MONOS ABS: 0.5 10*3/uL (ref 0.1–0.9)
NEUTROS ABS: 2.6 10*3/uL (ref 1.4–7.0)
Neutrophils: 54 %
Platelets: 224 10*3/uL (ref 150–379)
RBC: 4.33 x10E6/uL (ref 3.77–5.28)
RDW: 13.4 % (ref 12.3–15.4)
WBC: 4.8 10*3/uL (ref 3.4–10.8)

## 2015-12-29 LAB — VITAMIN D 25 HYDROXY (VIT D DEFICIENCY, FRACTURES): VIT D 25 HYDROXY: 55.7 ng/mL (ref 30.0–100.0)

## 2016-05-01 ENCOUNTER — Encounter: Payer: Self-pay | Admitting: Family Medicine

## 2016-05-01 ENCOUNTER — Ambulatory Visit (INDEPENDENT_AMBULATORY_CARE_PROVIDER_SITE_OTHER): Payer: Managed Care, Other (non HMO) | Admitting: Family Medicine

## 2016-05-01 VITALS — BP 126/59 | HR 65 | Temp 97.6°F | Ht 63.0 in | Wt 141.0 lb

## 2016-05-01 DIAGNOSIS — E559 Vitamin D deficiency, unspecified: Secondary | ICD-10-CM | POA: Diagnosis not present

## 2016-05-01 DIAGNOSIS — E785 Hyperlipidemia, unspecified: Secondary | ICD-10-CM | POA: Diagnosis not present

## 2016-05-01 DIAGNOSIS — E876 Hypokalemia: Secondary | ICD-10-CM

## 2016-05-01 DIAGNOSIS — W57XXXA Bitten or stung by nonvenomous insect and other nonvenomous arthropods, initial encounter: Secondary | ICD-10-CM

## 2016-05-01 DIAGNOSIS — C50912 Malignant neoplasm of unspecified site of left female breast: Secondary | ICD-10-CM | POA: Diagnosis not present

## 2016-05-01 DIAGNOSIS — T148 Other injury of unspecified body region: Secondary | ICD-10-CM | POA: Diagnosis not present

## 2016-05-01 DIAGNOSIS — I10 Essential (primary) hypertension: Secondary | ICD-10-CM

## 2016-05-01 MED ORDER — DOXYCYCLINE HYCLATE 100 MG PO TABS: 100.0000 mg | ORAL_TABLET | Freq: Two times a day (BID) | ORAL | Status: DC

## 2016-05-01 MED ORDER — LOSARTAN POTASSIUM 50 MG PO TABS: ORAL_TABLET | ORAL | Status: DC

## 2016-05-01 MED ORDER — HYDROCHLOROTHIAZIDE 25 MG PO TABS: ORAL_TABLET | ORAL | Status: DC

## 2016-05-01 NOTE — Patient Instructions (Addendum)
Continue current medications. Continue good therapeutic lifestyle changes which include good diet and exercise. Fall precautions discussed with patient. If an FOBT was given today- please return it to our front desk. If you are over 60 years old - you may need Prevnar 57 or the adult Pneumonia vaccine.  **Flu shots are available--- please call and schedule a FLU-CLINIC appointment**  After your visit with [name] today you will receive a survey in the mail or online from [employer] regarding your care with [name]. Please take a moment to fill this out. Your feedback is very important to [name] as you can help [name] better understand your patient needs as well as improve your experience and satisfaction. WE CARE ABOUT YOU!!!   She should continue to follow-up with oncologist She should take the antibiotic for [facility] mount spotted fever twice daily with food as directed We will call with results of lab work as soon as they become available Continue to use Flonase but increase the dosage to 2 sprays each nostril daily Continue with nasal saline Continue with Astelin May add a Claritin every now and then if additional needed protection

## 2016-05-01 NOTE — Progress Notes (Signed)
Subjective:    Patient ID: Cindy Blake, female    DOB: Apr 26, 1956, 60 y.o.   MRN: [id number]  HPI Pt here for follow up and management of chronic medical problems which includes hypertension and hyperlipidemia. She is taking medications regularly.The patient today complains of sinus pressure. She is also had a recent tick bite on the left shoulder. She will get lab work today. She is requesting the refills of several medications.      Patient Active Problem List   Diagnosis Date Noted  . Cancer of upper-inner quadrant of female breast ([place]) 02/01/2015  . Generalized anxiety disorder 11/10/2013  . Allergic rhinitis 11/10/2013  . Hyperlipemia 07/06/2013  . Hypertension 07/06/2013  . Vitamin D deficiency 07/06/2013   Outpatient Encounter Prescriptions as of 05/01/2016  Medication Sig  . ALPRAZolam (XANAX) 1 MG tablet TAKE 1/2 TO 1 TABLET EVERY MORNING & BEDTIME AS NEEDED FOR ANXIETY & SLEEP  . aspirin 81 MG tablet Take 81 mg by mouth daily.  Marland Kitchen azelastine (ASTELIN) 0.1 % nasal spray Use in each nostril as directed 1-2 sprays  . Calcium Carbonate (CALCIUM 600 PO) Take 1 tablet by mouth daily.  . Cholecalciferol (VITAMIN D) 2000 UNITS CAPS Take 1 capsule by mouth daily.  . fexofenadine (ALLEGRA) 180 MG tablet Take 180 mg by mouth daily. Reported on 12/28/2015  . fluticasone (FLONASE) 50 MCG/ACT nasal spray USE 1 TO 2 SPRAYS IN EACH NOSTRIL ONCE DAILY AT BEDTIME  . hydrochlorothiazide (HYDRODIURIL) 25 MG tablet TAKE 1 TABLET ONCE A DAY  . losartan (COZAAR) 50 MG tablet TAKE 1 TABLET DAILY  . Multiple Vitamin (MULTIVITAMIN) tablet Take 1 tablet by mouth daily.  . tamoxifen (NOLVADEX) 20 MG tablet Take 20 mg by mouth daily.   No facility-administered encounter medications on file as of 05/01/2016.      Review of Systems  Constitutional: Negative.   HENT: Positive for sinus pressure.   Eyes: Negative.   Respiratory: Negative.   Cardiovascular: Negative.   Gastrointestinal:  Negative.   Endocrine: Negative.   Genitourinary: Negative.   Musculoskeletal: Negative.   Skin: Negative.        Tick bite - left shoulder   Allergic/Immunologic: Negative.   Neurological: Negative.   Hematological: Negative.   Psychiatric/Behavioral: Negative.        Objective:   Physical Exam  Constitutional: She is oriented to person, place, and time. She appears well-developed and well-nourished. No distress.  HENT:  Head: Normocephalic and atraumatic.  Right Ear: External ear normal.  Left Ear: External ear normal.  Mouth/Throat: Oropharynx is clear and moist. No oropharyngeal exudate.  Some nasal congestion bilaterally  Eyes: Conjunctivae and EOM are normal. Pupils are equal, round, and reactive to light. Right eye exhibits no discharge. Left eye exhibits no discharge. No scleral icterus.  Neck: Normal range of motion. Neck supple. No thyromegaly present.  No bruits adenopathy or thyromegaly  Cardiovascular: Normal rate, regular rhythm, normal heart sounds and intact distal pulses.   No murmur heard. Pulmonary/Chest: Effort normal and breath sounds normal. No respiratory distress. She has no wheezes. She has no rales. She exhibits no tenderness.  Clear anteriorly and posteriorly  Abdominal: Soft. Bowel sounds are normal. She exhibits no mass. There is no tenderness. There is no rebound and no guarding.  No liver or spleen enlargement epigastric or abdominal tenderness  Musculoskeletal: Normal range of motion. She exhibits no edema or tenderness.  Lymphadenopathy:    She has no cervical adenopathy.  Neurological: She  is alert and oriented to person, place, and time. She has normal reflexes. No cranial nerve deficit.  Skin: Skin is warm and dry. No rash noted.  The tick was brought in and is a small would tick or Dr. with a white spot on No significant skin irritation at site of tick bite  Psychiatric: She has a normal mood and affect. Her behavior is normal. Judgment and  thought content normal.  Nursing note and vitals reviewed.  BP 126/59 mmHg  Pulse 65  Temp(Src) 97.6 F (36.4 C) (Oral)  Ht '5\' 3"'$  (1.6 m)  Wt 141 lb (63.957 kg)  BMI 24.98 kg/m2        Assessment & Plan:  1. Essential hypertension -Blood pressure is good and the patient should continue with current treatment and sodium restriction - BMP8+EGFR - CBC with Differential/Platelet - Hepatic function panel  2. Vitamin D deficiency -10 you with current treatment pending results of lab work - CBC with Differential/Platelet - VITAMIN D 25 Hydroxy (Vit-D Deficiency, Fractures)  3. Hyperlipemia -Continue with aggressive therapeutic lifestyle changes - CBC with Differential/Platelet - NMR, lipoprofile  4. Malignant neoplasm of left female breast, unspecified site of breast ([place]) -Continue follow-up with oncology - CBC with Differential/Platelet  5. Tick bite -Take doxycycline as directed twice daily with food - Lyme Ab/Western Blot Reflex - [place] mtn spotted fvr abs pnl(IgG+IgM)  Meds ordered this encounter  Medications  . DISCONTD: doxycycline (VIBRA-TABS) 100 MG tablet    Sig: Take 1 tablet (100 mg total) by mouth 2 (two) times daily.    Dispense:  42 tablet    Refill:  0  . doxycycline (VIBRA-TABS) 100 MG tablet    Sig: Take 1 tablet (100 mg total) by mouth 2 (two) times daily.    Dispense:  42 tablet    Refill:  0  . losartan (COZAAR) 50 MG tablet    Sig: TAKE 1 TABLET DAILY    Dispense:  90 tablet    Refill:  3  . hydrochlorothiazide (HYDRODIURIL) 25 MG tablet    Sig: TAKE 1 TABLET ONCE A DAY    Dispense:  90 tablet    Refill:  3   Patient Instructions  Continue current medications. Continue good therapeutic lifestyle changes which include good diet and exercise. Fall precautions discussed with patient. If an FOBT was given today- please return it to our front desk. If you are over 45 years old - you may need Prevnar 25 or the adult Pneumonia vaccine.  **Flu  shots are available--- please call and schedule a FLU-CLINIC appointment**  After your visit with [name] today you will receive a survey in the mail or online from [employer] regarding your care with [name]. Please take a moment to fill this out. Your feedback is very important to [name] as you can help [name] better understand your patient needs as well as improve your experience and satisfaction. WE CARE ABOUT YOU!!!   She should continue to follow-up with oncologist She should take the antibiotic for [facility] mount spotted fever twice daily with food as directed We will call with results of lab work as soon as they become available Continue to use Flonase but increase the dosage to 2 sprays each nostril daily Continue with nasal saline Continue with Astelin May add a Claritin every now and then if additional needed protection   Arrie Senate MD

## 2016-05-03 LAB — ROCKY MTN SPOTTED FVR ABS PNL(IGG+IGM)
RMSF IGG: NEGATIVE
RMSF IgM: 0.66 index (ref 0.00–0.89)

## 2016-05-03 LAB — HEPATIC FUNCTION PANEL
ALBUMIN: 4.7 g/dL (ref 3.5–5.5)
ALK PHOS: 62 IU/L (ref 39–117)
ALT: 10 IU/L (ref 0–32)
AST: 15 IU/L (ref 0–40)
BILIRUBIN, DIRECT: 0.13 mg/dL (ref 0.00–0.40)
Bilirubin Total: 0.3 mg/dL (ref 0.0–1.2)
TOTAL PROTEIN: 6.7 g/dL (ref 6.0–8.5)

## 2016-05-03 LAB — CBC WITH DIFFERENTIAL/PLATELET
BASOS ABS: 0 10*3/uL (ref 0.0–0.2)
Basos: 1 %
EOS (ABSOLUTE): 0.2 10*3/uL (ref 0.0–0.4)
EOS: 4 %
HEMATOCRIT: 37.4 % (ref 34.0–46.6)
HEMOGLOBIN: 12.6 g/dL (ref 11.1–15.9)
IMMATURE GRANS (ABS): 0 10*3/uL (ref 0.0–0.1)
Immature Granulocytes: 0 %
LYMPHS: 27 %
Lymphocytes Absolute: 1.5 10*3/uL (ref 0.7–3.1)
MCH: 29.2 pg (ref 26.6–33.0)
MCHC: 33.7 g/dL (ref 31.5–35.7)
MCV: 87 fL (ref 79–97)
MONOCYTES: 8 %
Monocytes Absolute: 0.4 10*3/uL (ref 0.1–0.9)
NEUTROS ABS: 3.4 10*3/uL (ref 1.4–7.0)
Neutrophils: 60 %
Platelets: 222 10*3/uL (ref 150–379)
RBC: 4.31 x10E6/uL (ref 3.77–5.28)
RDW: 13.3 % (ref 12.3–15.4)
WBC: 5.6 10*3/uL (ref 3.4–10.8)

## 2016-05-03 LAB — NMR, LIPOPROFILE
CHOLESTEROL: 201 mg/dL — AB (ref 100–199)
HDL Cholesterol by NMR: 80 mg/dL (ref >39)
HDL PARTICLE NUMBER: 41.9 umol/L (ref >=30.5)
LDL Particle Number: 993 nmol/L (ref <1000)
LDL SIZE: 21.9 nm (ref >20.5)
LDL-C: 106 mg/dL — ABNORMAL HIGH (ref 0–99)
Triglycerides by NMR: 77 mg/dL (ref 0–149)

## 2016-05-03 LAB — LYME AB/WESTERN BLOT REFLEX
LYME DISEASE AB, QUANT, IGM: <0.8 index (ref 0.00–0.79)
Lyme IgG/IgM Ab: <0.91 {ISR} (ref 0.00–0.90)

## 2016-05-03 LAB — BMP8+EGFR
BUN / CREAT RATIO: 16 (ref 9–23)
BUN: 10 mg/dL (ref 6–24)
CHLORIDE: 96 mmol/L (ref 96–106)
CO2: 29 mmol/L (ref 18–29)
Calcium: 9.1 mg/dL (ref 8.7–10.2)
Creatinine, Ser: 0.61 mg/dL (ref 0.57–1.00)
GFR calc non Af Amer: 100 mL/min/{1.73_m2} (ref >59)
GFR, EST AFRICAN AMERICAN: 115 mL/min/{1.73_m2} (ref >59)
GLUCOSE: 83 mg/dL (ref 65–99)
POTASSIUM: 3 mmol/L — AB (ref 3.5–5.2)
SODIUM: 142 mmol/L (ref 134–144)

## 2016-05-03 LAB — VITAMIN D 25 HYDROXY (VIT D DEFICIENCY, FRACTURES): Vit D, 25-Hydroxy: 54.4 ng/mL (ref 30.0–100.0)

## 2016-05-03 MED ORDER — POTASSIUM CHLORIDE ER 10 MEQ PO TBCR: EXTENDED_RELEASE_TABLET | ORAL | Status: DC

## 2016-05-03 NOTE — Addendum Note (Signed)
Addended by: Thana Ates on: 05/03/2016 08:29 AM   Modules accepted: Orders

## 2016-05-16 ENCOUNTER — Other Ambulatory Visit: Payer: Managed Care, Other (non HMO)

## 2016-05-16 DIAGNOSIS — E876 Hypokalemia: Secondary | ICD-10-CM

## 2016-05-16 DIAGNOSIS — Z1211 Encounter for screening for malignant neoplasm of colon: Secondary | ICD-10-CM

## 2016-05-17 LAB — BMP8+EGFR
BUN / CREAT RATIO: 14 (ref 9–23)
BUN: 9 mg/dL (ref 6–24)
CO2: 26 mmol/L (ref 18–29)
CREATININE: 0.65 mg/dL (ref 0.57–1.00)
Calcium: 8.7 mg/dL (ref 8.7–10.2)
Chloride: 99 mmol/L (ref 96–106)
GFR, EST AFRICAN AMERICAN: 112 mL/min/{1.73_m2} (ref >59)
GFR, EST NON AFRICAN AMERICAN: 97 mL/min/{1.73_m2} (ref >59)
Glucose: 80 mg/dL (ref 65–99)
Potassium: 3.7 mmol/L (ref 3.5–5.2)
SODIUM: 144 mmol/L (ref 134–144)

## 2016-05-21 LAB — FECAL OCCULT BLOOD, IMMUNOCHEMICAL: FECAL OCCULT BLD: NEGATIVE

## 2016-05-29 ENCOUNTER — Other Ambulatory Visit: Payer: Self-pay

## 2016-05-29 MED ORDER — POTASSIUM CHLORIDE ER 10 MEQ PO TBCR: EXTENDED_RELEASE_TABLET | ORAL | Status: DC

## 2016-09-03 ENCOUNTER — Encounter: Payer: Self-pay | Admitting: Family Medicine

## 2016-09-03 ENCOUNTER — Ambulatory Visit (INDEPENDENT_AMBULATORY_CARE_PROVIDER_SITE_OTHER): Payer: Managed Care, Other (non HMO) | Admitting: Family Medicine

## 2016-09-03 VITALS — BP 133/60 | HR 58 | Temp 97.1°F | Ht 63.0 in | Wt 140.0 lb

## 2016-09-03 DIAGNOSIS — R5383 Other fatigue: Secondary | ICD-10-CM

## 2016-09-03 DIAGNOSIS — I10 Essential (primary) hypertension: Secondary | ICD-10-CM | POA: Diagnosis not present

## 2016-09-03 DIAGNOSIS — E785 Hyperlipidemia, unspecified: Secondary | ICD-10-CM | POA: Diagnosis not present

## 2016-09-03 DIAGNOSIS — E559 Vitamin D deficiency, unspecified: Secondary | ICD-10-CM | POA: Diagnosis not present

## 2016-09-03 DIAGNOSIS — C50912 Malignant neoplasm of unspecified site of left female breast: Secondary | ICD-10-CM | POA: Diagnosis not present

## 2016-09-03 DIAGNOSIS — Z23 Encounter for immunization: Secondary | ICD-10-CM | POA: Diagnosis not present

## 2016-09-03 NOTE — Patient Instructions (Addendum)
Continue current medications. Continue good therapeutic lifestyle changes which include good diet and exercise. Fall precautions discussed with patient. If an FOBT was given today- please return it to our front desk. If you are over 60 years old - you may need Prevnar 22 or the adult Pneumonia vaccine.  **Flu shots are available--- please call and schedule a FLU-CLINIC appointment**  After your visit with [name] today you will receive a survey in the mail or online from [employer] regarding your care with [name]. Please take a moment to fill this out. Your feedback is very important to [name] as you can help [name] better understand your patient needs as well as improve your experience and satisfaction. WE CARE ABOUT YOU!!!   Continue to follow-up with oncology Make sure that the ophthalmologist since [name] a copy of your eye exam report We will call with your lab work results as soon as those results become available Continue to monitor blood pressures at home

## 2016-09-03 NOTE — Progress Notes (Signed)
Subjective:    Patient ID: Cindy Blake, female    DOB: 06-14-1956, 60 y.o.   MRN: [id number]  HPI Pt here for follow up and management of chronic medical problems which includes hypertension and hyperlipidemia. She is taking medications regularly.The patient does complain of some fatigue and this is been going on for a good while. It is important to note that she just lost her mother in June of cancer. She also had breast cancer but has had it 20 years ago. It came back it came back to her bones and her spine. But she went for 20 years without any problems. He was 77. The patient continues to be followed by her oncologist and everything is going well from that perspective. She denies any chest pain or shortness of breath. She denies any trouble with her intestinal tract including swallowing heartburn indigestion nausea vomiting diarrhea or blood in the stool. She had a fecal occult blood test card that was negative in June. She is passing her water without problems. The patient's home blood pressures have been running in the 120 to 1:30 range over the 50-60 range.     Patient Active Problem List   Diagnosis Date Noted  . Malignant neoplasm of left female breast ([place]) 05/01/2016  . Cancer of upper-inner quadrant of female breast ([place]) 02/01/2015  . Generalized anxiety disorder 11/10/2013  . Allergic rhinitis 11/10/2013  . Hyperlipemia 07/06/2013  . Hypertension 07/06/2013  . Vitamin D deficiency 07/06/2013   Outpatient Encounter Prescriptions as of 09/03/2016  Medication Sig  . ALPRAZolam (XANAX) 1 MG tablet TAKE 1/2 TO 1 TABLET EVERY MORNING & BEDTIME AS NEEDED FOR ANXIETY & SLEEP  . aspirin 81 MG tablet Take 81 mg by mouth daily.  Marland Kitchen azelastine (ASTELIN) 0.1 % nasal spray Use in each nostril as directed 1-2 sprays  . Calcium Carbonate (CALCIUM 600 PO) Take 1 tablet by mouth daily.  . Cholecalciferol (VITAMIN D) 2000 UNITS CAPS Take 1 capsule by mouth daily.  . fexofenadine (ALLEGRA)  180 MG tablet Take 180 mg by mouth daily. Reported on 12/28/2015  . fluticasone (FLONASE) 50 MCG/ACT nasal spray USE 1 TO 2 SPRAYS IN EACH NOSTRIL ONCE DAILY AT BEDTIME  . hydrochlorothiazide (HYDRODIURIL) 25 MG tablet TAKE 1 TABLET ONCE A DAY  . losartan (COZAAR) 50 MG tablet TAKE 1 TABLET DAILY  . Multiple Vitamin (MULTIVITAMIN) tablet Take 1 tablet by mouth daily.  . potassium chloride (K-DUR) 10 MEQ tablet Take 1 tablet BID for 1 week and then 1 daily.  . tamoxifen (NOLVADEX) 20 MG tablet Take 20 mg by mouth daily.  . [DISCONTINUED] doxycycline (VIBRA-TABS) 100 MG tablet Take 1 tablet (100 mg total) by mouth 2 (two) times daily.   No facility-administered encounter medications on file as of 09/03/2016.       Review of Systems  Constitutional: Positive for fatigue.  HENT: Negative.   Eyes: Negative.   Respiratory: Negative.   Cardiovascular: Negative.   Gastrointestinal: Negative.   Endocrine: Negative.   Genitourinary: Negative.   Musculoskeletal: Negative.   Skin: Negative.   Allergic/Immunologic: Negative.   Neurological: Negative.   Hematological: Negative.   Psychiatric/Behavioral: Negative.        Objective:   Physical Exam  Constitutional: She is oriented to person, place, and time. She appears well-developed and well-nourished. No distress.  HENT:  Head: Normocephalic.  Right Ear: External ear normal.  Left Ear: External ear normal.  Nose: Nose normal.  Mouth/Throat: Oropharynx is clear and  moist.  Eyes: Conjunctivae and EOM are normal. Pupils are equal, round, and reactive to light. Right eye exhibits no discharge. Left eye exhibits no discharge. No scleral icterus.  Neck: Normal range of motion. Neck supple. No thyromegaly present.  No bruits thyromegaly or anterior cervical adenopathy  Cardiovascular: Normal rate, regular rhythm, normal heart sounds and intact distal pulses.   No murmur heard. The heart is regular at 72/m  Pulmonary/Chest: Effort normal and  breath sounds normal. No respiratory distress. She has no wheezes. She has no rales.  Clear anteriorly and posteriorly  Abdominal: Soft. Bowel sounds are normal. She exhibits no mass. There is no tenderness. There is no rebound and no guarding.  No abdominal tenderness or organ enlargement or bruits or inguinal adenopathy with good inguinal pulses  Musculoskeletal: Normal range of motion. She exhibits no edema.  Lymphadenopathy:    She has no cervical adenopathy.  Neurological: She is alert and oriented to person, place, and time. She has normal reflexes. No cranial nerve deficit.  Skin: Skin is warm and dry. No rash noted.  Psychiatric: She has a normal mood and affect. Her behavior is normal. Judgment and thought content normal.  Nursing note and vitals reviewed.  BP (!) 154/80 (BP Location: Right Arm)   Pulse (!) 58   Temp 97.1 F (36.2 C) (Oral)   Ht '5\' 3"'$  (1.6 m)   Wt 140 lb (63.5 kg)   SpO2 98%   BMI 24.80 kg/m         Assessment & Plan:  1. Essential hypertension -Continue current treatment and sodium restriction as much as possible and check on blood pressure readings periodically - CBC with Differential/Platelet - BMP8+EGFR - Hepatic function panel  2. Vitamin D deficiency -Continue current treatment pending results of lab work - CBC with Differential/Platelet - VITAMIN D 25 Hydroxy (Vit-D Deficiency, Fractures)  3. Hyperlipemia -Continue with aggressive therapeutic lifestyle changes pending results of lab work - CBC with Differential/Platelet - Lipid panel  4. Malignant neoplasm of left female breast, unspecified site of breast ([place]) -Continue to follow-up with oncologist - CBC with Differential/Platelet  5. Other fatigue - Thyroid Panel With TSH  Patient Instructions  Continue current medications. Continue good therapeutic lifestyle changes which include good diet and exercise. Fall precautions discussed with patient. If an FOBT was given today- please  return it to our front desk. If you are over 61 years old - you may need Prevnar 65 or the adult Pneumonia vaccine.  **Flu shots are available--- please call and schedule a FLU-CLINIC appointment**  After your visit with [name] today you will receive a survey in the mail or online from [employer] regarding your care with [name]. Please take a moment to fill this out. Your feedback is very important to [name] as you can help [name] better understand your patient needs as well as improve your experience and satisfaction. WE CARE ABOUT YOU!!!   Continue to follow-up with oncology Make sure that the ophthalmologist since [name] a copy of your eye exam report We will call with your lab work results as soon as those results become available Continue to monitor blood pressures at home    Arrie Senate MD

## 2016-09-04 LAB — HEPATIC FUNCTION PANEL
ALK PHOS: 59 IU/L (ref 39–117)
ALT: 10 IU/L (ref 0–32)
AST: 17 IU/L (ref 0–40)
Albumin: 4.4 g/dL (ref 3.6–4.8)
BILIRUBIN, DIRECT: 0.08 mg/dL (ref 0.00–0.40)
Bilirubin Total: 0.3 mg/dL (ref 0.0–1.2)
TOTAL PROTEIN: 6.8 g/dL (ref 6.0–8.5)

## 2016-09-04 LAB — CBC WITH DIFFERENTIAL/PLATELET
Basophils Absolute: 0 10*3/uL (ref 0.0–0.2)
Basos: 1 %
EOS (ABSOLUTE): 0.2 10*3/uL (ref 0.0–0.4)
Eos: 4 %
HEMOGLOBIN: 12.5 g/dL (ref 11.1–15.9)
Hematocrit: 36.7 % (ref 34.0–46.6)
IMMATURE GRANS (ABS): 0 10*3/uL (ref 0.0–0.1)
Immature Granulocytes: 0 %
LYMPHS ABS: 1.8 10*3/uL (ref 0.7–3.1)
Lymphs: 32 %
MCH: 30 pg (ref 26.6–33.0)
MCHC: 34.1 g/dL (ref 31.5–35.7)
MCV: 88 fL (ref 79–97)
MONOS ABS: 0.5 10*3/uL (ref 0.1–0.9)
Monocytes: 8 %
NEUTROS PCT: 55 %
Neutrophils Absolute: 3.1 10*3/uL (ref 1.4–7.0)
PLATELETS: 210 10*3/uL (ref 150–379)
RBC: 4.17 x10E6/uL (ref 3.77–5.28)
RDW: 13.2 % (ref 12.3–15.4)
WBC: 5.6 10*3/uL (ref 3.4–10.8)

## 2016-09-04 LAB — LIPID PANEL
CHOLESTEROL TOTAL: 199 mg/dL (ref 100–199)
Chol/HDL Ratio: 2.1 ratio units (ref 0.0–4.4)
HDL: 94 mg/dL (ref >39)
LDL Calculated: 93 mg/dL (ref 0–99)
Triglycerides: 58 mg/dL (ref 0–149)
VLDL CHOLESTEROL CAL: 12 mg/dL (ref 5–40)

## 2016-09-04 LAB — BMP8+EGFR
BUN / CREAT RATIO: 15 (ref 12–28)
BUN: 10 mg/dL (ref 8–27)
CO2: 28 mmol/L (ref 18–29)
CREATININE: 0.67 mg/dL (ref 0.57–1.00)
Calcium: 9 mg/dL (ref 8.7–10.3)
Chloride: 101 mmol/L (ref 96–106)
GFR calc non Af Amer: 96 mL/min/{1.73_m2} (ref >59)
GFR, EST AFRICAN AMERICAN: 110 mL/min/{1.73_m2} (ref >59)
GLUCOSE: 85 mg/dL (ref 65–99)
Potassium: 3.9 mmol/L (ref 3.5–5.2)
SODIUM: 144 mmol/L (ref 134–144)

## 2016-09-04 LAB — THYROID PANEL WITH TSH
FREE THYROXINE INDEX: 2.2 (ref 1.2–4.9)
T3 UPTAKE RATIO: 22 % — AB (ref 24–39)
T4 TOTAL: 10 ug/dL (ref 4.5–12.0)
TSH: 2.09 u[IU]/mL (ref 0.450–4.500)

## 2016-09-04 LAB — VITAMIN D 25 HYDROXY (VIT D DEFICIENCY, FRACTURES): VIT D 25 HYDROXY: 71.2 ng/mL (ref 30.0–100.0)

## 2016-09-06 ENCOUNTER — Other Ambulatory Visit: Payer: Self-pay | Admitting: *Deleted

## 2016-09-06 MED ORDER — POTASSIUM CHLORIDE ER 10 MEQ PO TBCR: EXTENDED_RELEASE_TABLET | ORAL | 0 refills | Status: DC

## 2016-11-14 ENCOUNTER — Other Ambulatory Visit: Payer: Self-pay | Admitting: Family Medicine

## 2017-01-09 ENCOUNTER — Ambulatory Visit (INDEPENDENT_AMBULATORY_CARE_PROVIDER_SITE_OTHER): Payer: Managed Care, Other (non HMO) | Admitting: Family Medicine

## 2017-01-09 ENCOUNTER — Encounter: Payer: Self-pay | Admitting: Family Medicine

## 2017-01-09 VITALS — BP 127/66 | HR 60 | Temp 98.0°F | Ht 63.0 in | Wt 143.0 lb

## 2017-01-09 DIAGNOSIS — C50912 Malignant neoplasm of unspecified site of left female breast: Secondary | ICD-10-CM

## 2017-01-09 DIAGNOSIS — I10 Essential (primary) hypertension: Secondary | ICD-10-CM | POA: Diagnosis not present

## 2017-01-09 DIAGNOSIS — Z23 Encounter for immunization: Secondary | ICD-10-CM

## 2017-01-09 DIAGNOSIS — E559 Vitamin D deficiency, unspecified: Secondary | ICD-10-CM | POA: Diagnosis not present

## 2017-01-09 DIAGNOSIS — E7800 Pure hypercholesterolemia, unspecified: Secondary | ICD-10-CM | POA: Diagnosis not present

## 2017-01-09 MED ORDER — AZELASTINE HCL 0.1 % NA SOLN: NASAL | 3 refills | Status: DC

## 2017-01-09 MED ORDER — FLUTICASONE PROPIONATE 50 MCG/ACT NA SUSP: NASAL | 3 refills | Status: DC

## 2017-01-09 NOTE — Patient Instructions (Addendum)
Continue current medications. Continue good therapeutic lifestyle changes which include good diet and exercise. Fall precautions discussed with patient. If an FOBT was given today- please return it to our front desk. If you are over 61 years old - you may need Prevnar 6 or the adult Pneumonia vaccine.  **Flu shots are available--- please call and schedule a FLU-CLINIC appointment**  After your visit with [name] today you will receive a survey in the mail or online from [employer] regarding your care with [name]. Please take a moment to fill this out. Your feedback is very important to [name] as you can help [name] better understand your patient needs as well as improve your experience and satisfaction. WE CARE ABOUT YOU!!!   She needs to follow-up with oncology as planned If insomnia problems continue, call back and we will call in some [place] Noble for you to try For the remainder of the winter drink plenty of fluids stay well hydrated and use good hand sanitizers and good respiratory precautions Drink plenty of fluids and stay well hydrated

## 2017-01-09 NOTE — Progress Notes (Signed)
Subjective:    Patient ID: Cindy Blake, female    DOB: [birth year], 61 y.o.   MRN: [id number]  HPI Pt here for follow up and management of chronic medical problems which includes hyperlipidemia and hypertension. She is taking medication regularly.The patient is doing well with no specific complaints. She is having some insomnia because of the new dog that they have adopted. She is doing genetic testing because of her history of breast cancer. She is requesting refill on her Astelin and Flonase. We will have her continue to take the potassium as her potassiums have been good and since she's been on the potassium we do not want it to go any she will get a Prevnar vaccine today. She continues to see the oncologist. She is changing oncologist. She denies any chest pain or shortness of breath. She denies any trouble with swallowing heartburn indigestion nausea vomiting diarrhea or blood in the stool. She is passing her water without problems.     Patient Active Problem List   Diagnosis Date Noted  . Malignant neoplasm of left female breast ([place]) 05/01/2016  . Cancer of upper-inner quadrant of female breast ([place]) 02/01/2015  . Generalized anxiety disorder 11/10/2013  . Allergic rhinitis 11/10/2013  . Hyperlipemia 07/06/2013  . Hypertension 07/06/2013  . Vitamin D deficiency 07/06/2013   Outpatient Encounter Prescriptions as of 01/09/2017  Medication Sig  . ALPRAZolam (XANAX) 1 MG tablet TAKE 1/2 TO 1 TABLET EVERY MORNING & BEDTIME AS NEEDED FOR ANXIETY & SLEEP  . aspirin 81 MG tablet Take 81 mg by mouth daily.  Marland Kitchen azelastine (ASTELIN) 0.1 % nasal spray Use in each nostril as directed 1-2 sprays  . Calcium Carbonate (CALCIUM 600 PO) Take 1 tablet by mouth daily.  . Cholecalciferol (VITAMIN D) 2000 UNITS CAPS Take 1 capsule by mouth daily.  . fexofenadine (ALLEGRA) 180 MG tablet Take 180 mg by mouth daily. Reported on 12/28/2015  . fluticasone (FLONASE) 50 MCG/ACT nasal spray USE 1 TO 2 SPRAYS  IN EACH NOSTRIL ONCE DAILY AT BEDTIME  . hydrochlorothiazide (HYDRODIURIL) 25 MG tablet TAKE 1 TABLET ONCE A DAY  . losartan (COZAAR) 50 MG tablet TAKE 1 TABLET DAILY  . Multiple Vitamin (MULTIVITAMIN) tablet Take 1 tablet by mouth daily.  . potassium chloride (K-DUR,KLOR-CON) 10 MEQ tablet AT THE START OF THERAPY TAKE 1 TABLET TWICE A DAY FOR 1 WEEK THEN 1 TABLET DAILY THEREAFTER  . tamoxifen (NOLVADEX) 20 MG tablet Take 20 mg by mouth daily.   No facility-administered encounter medications on file as of 01/09/2017.      Review of Systems  Constitutional: Negative.        Not sleeping well  HENT: Negative.   Eyes: Negative.   Respiratory: Negative.   Cardiovascular: Negative.   Gastrointestinal: Negative.   Endocrine: Negative.   Genitourinary: Negative.   Musculoskeletal: Negative.   Skin: Negative.   Allergic/Immunologic: Negative.   Neurological: Negative.   Hematological: Negative.   Psychiatric/Behavioral: Negative.        Objective:   Physical Exam  Constitutional: She is oriented to person, place, and time. She appears well-developed and well-nourished. No distress.  Pleasant and alert and in good spirits  HENT:  Head: Normocephalic and atraumatic.  Right Ear: External ear normal.  Left Ear: External ear normal.  Nose: Nose normal.  Mouth/Throat: Oropharynx is clear and moist. No oropharyngeal exudate.  Eyes: Conjunctivae and EOM are normal. Pupils are equal, round, and reactive to light. Right eye exhibits no discharge.  Left eye exhibits no discharge. No scleral icterus.  Neck: Normal range of motion. Neck supple. No thyromegaly present.  No bruits or thyromegaly  Cardiovascular: Normal rate, regular rhythm, normal heart sounds and intact distal pulses.   No murmur heard. The heart is regular at 72/m  Pulmonary/Chest: Effort normal and breath sounds normal. No respiratory distress. She has no wheezes. She has no rales. She exhibits no tenderness.  Clear  anteriorly and posteriorly  Abdominal: Soft. Bowel sounds are normal. She exhibits no mass. There is no tenderness. There is no rebound and no guarding.  No liver or spleen enlargement. No masses. No inguinal adenopathy.  Musculoskeletal: Normal range of motion. She exhibits no edema.  Lymphadenopathy:    She has no cervical adenopathy.  Neurological: She is alert and oriented to person, place, and time. She has normal reflexes. No cranial nerve deficit.  Skin: Skin is warm and dry. No rash noted.  Psychiatric: She has a normal mood and affect. Her behavior is normal. Judgment and thought content normal.  Nursing note and vitals reviewed.  BP 127/66 (BP Location: Left Arm)   Pulse 60   Temp 98 F (36.7 C) (Oral)   Ht 5\' 3"  (1.6 m)   Wt 143 lb (64.9 kg)   BMI 25.33 kg/m         Assessment & Plan:  1. Essential hypertension -Continue current treatment pending results of lab work continue potassium for the time being until lab work is returned - BMP8+EGFR - CBC with Differential/Platelet - Hepatic function panel  2. Vitamin D deficiency -Continue current treatment pending results of lab work - CBC with Differential/Platelet - VITAMIN D 25 Hydroxy (Vit-D Deficiency, Fractures)  3. Pure hypercholesterolemia -Continue aggressive therapeutic lifestyle changes and current treatment - CBC with Differential/Platelet - Lipid panel  4. Malignant neoplasm of left female breast, unspecified estrogen receptor status, unspecified site of breast ([facility]) -Continue follow-up with oncology - CBC with Differential/Platelet  Meds ordered this encounter  Medications  . azelastine (ASTELIN) 0.1 % nasal spray    Sig: Use in each nostril as directed 1-2 sprays    Dispense:  90 mL    Refill:  3  . fluticasone (FLONASE) 50 MCG/ACT nasal spray    Sig: USE 1 TO 2 SPRAYS IN EACH NOSTRIL ONCE DAILY AT BEDTIME    Dispense:  48 g    Refill:  3   Patient Instructions  Continue current  medications. Continue good therapeutic lifestyle changes which include good diet and exercise. Fall precautions discussed with patient. If an FOBT was given today- please return it to our front desk. If you are over 38 years old - you may need Prevnar 13 or the adult Pneumonia vaccine.  **Flu shots are available--- please call and schedule a FLU-CLINIC appointment**  After your visit with 44 today you will receive a survey in the mail or online from [name] regarding your care with [employer]. Please take a moment to fill this out. Your feedback is very important to [name] as you can help [name] better understand your patient needs as well as improve your experience and satisfaction. WE CARE ABOUT YOU!!!   She needs to follow-up with oncology as planned If insomnia problems continue, call back and we will call in some [name] for you to try For the remainder of the winter drink plenty of fluids stay well hydrated and use good hand sanitizers and good respiratory precautions Drink plenty of fluids and stay well hydrated  Arrie Senate MD

## 2017-01-10 LAB — CBC WITH DIFFERENTIAL/PLATELET
BASOS ABS: 0 10*3/uL (ref 0.0–0.2)
BASOS: 1 %
EOS (ABSOLUTE): 0.2 10*3/uL (ref 0.0–0.4)
Eos: 4 %
HEMOGLOBIN: 13.1 g/dL (ref 11.1–15.9)
Hematocrit: 38.3 % (ref 34.0–46.6)
IMMATURE GRANS (ABS): 0 10*3/uL (ref 0.0–0.1)
Immature Granulocytes: 0 %
LYMPHS ABS: 1.7 10*3/uL (ref 0.7–3.1)
Lymphs: 31 %
MCH: 30.5 pg (ref 26.6–33.0)
MCHC: 34.2 g/dL (ref 31.5–35.7)
MCV: 89 fL (ref 79–97)
MONOCYTES: 9 %
Monocytes Absolute: 0.5 10*3/uL (ref 0.1–0.9)
NEUTROS ABS: 3 10*3/uL (ref 1.4–7.0)
Neutrophils: 55 %
Platelets: 199 10*3/uL (ref 150–379)
RBC: 4.3 x10E6/uL (ref 3.77–5.28)
RDW: 13.2 % (ref 12.3–15.4)
WBC: 5.4 10*3/uL (ref 3.4–10.8)

## 2017-01-10 LAB — LIPID PANEL
Chol/HDL Ratio: 2.2 ratio units (ref 0.0–4.4)
Cholesterol, Total: 203 mg/dL — ABNORMAL HIGH (ref 100–199)
HDL: 93 mg/dL (ref >39)
LDL Calculated: 96 mg/dL (ref 0–99)
TRIGLYCERIDES: 70 mg/dL (ref 0–149)
VLDL CHOLESTEROL CAL: 14 mg/dL (ref 5–40)

## 2017-01-10 LAB — BMP8+EGFR
BUN/Creatinine Ratio: 19 (ref 12–28)
BUN: 13 mg/dL (ref 8–27)
CHLORIDE: 98 mmol/L (ref 96–106)
CO2: 27 mmol/L (ref 18–29)
Calcium: 9.3 mg/dL (ref 8.7–10.3)
Creatinine, Ser: 0.67 mg/dL (ref 0.57–1.00)
GFR, EST AFRICAN AMERICAN: 110 mL/min/{1.73_m2} (ref >59)
GFR, EST NON AFRICAN AMERICAN: 96 mL/min/{1.73_m2} (ref >59)
Glucose: 86 mg/dL (ref 65–99)
POTASSIUM: 4.2 mmol/L (ref 3.5–5.2)
Sodium: 142 mmol/L (ref 134–144)

## 2017-01-10 LAB — HEPATIC FUNCTION PANEL
ALK PHOS: 64 IU/L (ref 39–117)
ALT: 14 IU/L (ref 0–32)
AST: 17 IU/L (ref 0–40)
Albumin: 4.6 g/dL (ref 3.6–4.8)
BILIRUBIN, DIRECT: 0.1 mg/dL (ref 0.00–0.40)
Bilirubin Total: 0.4 mg/dL (ref 0.0–1.2)
TOTAL PROTEIN: 6.8 g/dL (ref 6.0–8.5)

## 2017-01-10 LAB — VITAMIN D 25 HYDROXY (VIT D DEFICIENCY, FRACTURES): VIT D 25 HYDROXY: 66.9 ng/mL (ref 30.0–100.0)

## 2017-02-21 ENCOUNTER — Other Ambulatory Visit: Payer: Self-pay | Admitting: Family Medicine

## 2017-05-15 ENCOUNTER — Ambulatory Visit: Payer: Managed Care, Other (non HMO) | Admitting: Family Medicine

## 2017-05-20 ENCOUNTER — Encounter: Payer: Self-pay | Admitting: Family Medicine

## 2017-05-20 ENCOUNTER — Ambulatory Visit (INDEPENDENT_AMBULATORY_CARE_PROVIDER_SITE_OTHER): Payer: Managed Care, Other (non HMO) | Admitting: Family Medicine

## 2017-05-20 VITALS — BP 127/67 | HR 64 | Temp 97.7°F | Ht 63.0 in | Wt 150.0 lb

## 2017-05-20 DIAGNOSIS — I10 Essential (primary) hypertension: Secondary | ICD-10-CM | POA: Diagnosis not present

## 2017-05-20 DIAGNOSIS — E559 Vitamin D deficiency, unspecified: Secondary | ICD-10-CM | POA: Diagnosis not present

## 2017-05-20 DIAGNOSIS — W57XXXA Bitten or stung by nonvenomous insect and other nonvenomous arthropods, initial encounter: Secondary | ICD-10-CM | POA: Diagnosis not present

## 2017-05-20 DIAGNOSIS — E7800 Pure hypercholesterolemia, unspecified: Secondary | ICD-10-CM

## 2017-05-20 DIAGNOSIS — C50912 Malignant neoplasm of unspecified site of left female breast: Secondary | ICD-10-CM

## 2017-05-20 MED ORDER — DOXYCYCLINE HYCLATE 100 MG PO TABS: 100.0000 mg | ORAL_TABLET | Freq: Two times a day (BID) | ORAL | 0 refills | Status: DC

## 2017-05-20 NOTE — Patient Instructions (Addendum)
Continue current medications. Continue good therapeutic lifestyle changes which include good diet and exercise. Fall precautions discussed with patient. If an FOBT was given today- please return it to our front desk. If you are over 61 years old - you may need Prevnar 11 or the adult Pneumonia vaccine.  **Flu shots are available--- please call and schedule a FLU-CLINIC appointment**  After your visit with [name] today you will receive a survey in the mail or online from [employer] regarding your care with [name]. Please take a moment to fill this out. Your feedback is very important to [name] as you can help [name] better understand your patient needs as well as improve your experience and satisfaction. WE CARE ABOUT YOU!!!   Take antibiotic as directed Always check closely for tick bites and wear protective clothing and use deep [place] off if out in the yard or in the woods. This summer drink plenty of fluids and stay well hydrated Continue to follow-up with oncologist as doing

## 2017-05-20 NOTE — Progress Notes (Signed)
Subjective:    Patient ID: Cindy Blake, female    DOB: Feb 13, 1956, 61 y.o.   MRN: [id number]  HPI Pt here for follow up and management of chronic medical problems which includes hyperlipidemia and hypertension. She is taking medication regularly.The patient is doing well other than having 2 tick bites. The patient continues to see the oncologist at [facility] about every 6 months. She denies any chest pain or shortness of breath. She denies any trouble with her stomach including nausea vomiting diarrhea blood in the stool or black tarry bowel movements. She is passing her water well. Her last colonoscopy was in January 2010. She is about to 312 7 years out from her diagnosis of breast cancer. She is currently seeing Dr. Abran Duke.     Patient Active Problem List   Diagnosis Date Noted  . Malignant neoplasm of left female breast ([place]) 05/01/2016  . Cancer of upper-inner quadrant of female breast ([place]) 02/01/2015  . Generalized anxiety disorder 11/10/2013  . Allergic rhinitis 11/10/2013  . Hyperlipemia 07/06/2013  . Hypertension 07/06/2013  . Vitamin D deficiency 07/06/2013   Outpatient Encounter Prescriptions as of 05/20/2017  Medication Sig  . ALPRAZolam (XANAX) 1 MG tablet TAKE 1/2 TO 1 TABLET EVERY MORNING & BEDTIME AS NEEDED FOR ANXIETY & SLEEP  . aspirin 81 MG tablet Take 81 mg by mouth daily.  Marland Kitchen azelastine (ASTELIN) 0.1 % nasal spray Use in each nostril as directed 1-2 sprays  . Calcium Carbonate (CALCIUM 600 PO) Take 1 tablet by mouth daily.  . Cholecalciferol (VITAMIN D) 2000 UNITS CAPS Take 1 capsule by mouth daily.  . fexofenadine (ALLEGRA) 180 MG tablet Take 180 mg by mouth daily. Reported on 12/28/2015  . fluticasone (FLONASE) 50 MCG/ACT nasal spray USE 1 TO 2 SPRAYS IN EACH NOSTRIL ONCE DAILY AT BEDTIME  . hydrochlorothiazide (HYDRODIURIL) 25 MG tablet TAKE 1 TABLET ONCE A DAY  . losartan (COZAAR) 50 MG tablet TAKE 1 TABLET DAILY  . Multiple Vitamin (MULTIVITAMIN)  tablet Take 1 tablet by mouth daily.  . potassium chloride (K-DUR,KLOR-CON) 10 MEQ tablet AT THE START OF THERAPY TAKE 1 TABLET TWICE A DAY FOR 1 WEEK THEN 1 TABLET DAILY THEREAFTER  . tamoxifen (NOLVADEX) 20 MG tablet Take 20 mg by mouth daily.   No facility-administered encounter medications on file as of 05/20/2017.      Review of Systems  Constitutional: Negative.   HENT: Negative.   Eyes: Negative.   Respiratory: Negative.   Cardiovascular: Negative.   Gastrointestinal: Negative.   Endocrine: Negative.   Genitourinary: Negative.   Musculoskeletal: Negative.   Skin: Negative.   Allergic/Immunologic: Negative.   Neurological: Negative.   Hematological: Negative.   Psychiatric/Behavioral: Negative.        Objective:   Physical Exam  Constitutional: She is oriented to person, place, and time. She appears well-developed and well-nourished. No distress.  The patient is pleasant and alert  HENT:  Head: Normocephalic and atraumatic.  Right Ear: External ear normal.  Left Ear: External ear normal.  Nose: Nose normal.  Mouth/Throat: No oropharyngeal exudate.  Eyes: Conjunctivae and EOM are normal. Pupils are equal, round, and reactive to light. Right eye exhibits no discharge. Left eye exhibits no discharge. No scleral icterus.  Neck: Normal range of motion. Neck supple. No thyromegaly present.  No bruits thyromegaly or anterior cervical adenopathy  Cardiovascular: Normal rate, regular rhythm, normal heart sounds and intact distal pulses.   No murmur heard. Heart is regular at 60/m  Pulmonary/Chest: Effort normal and breath sounds normal. No respiratory distress. She has no wheezes. She has no rales.  Clear anteriorly and posteriorly  Abdominal: Soft. Bowel sounds are normal. She exhibits no mass. There is no tenderness. There is no rebound and no guarding.  No spleen or liver enlargement. No epigastric tenderness. No inguinal adenopathy. No suprapubic tenderness. No masses.    Musculoskeletal: Normal range of motion. She exhibits no edema.  The patient wears a sleeve on her left arm because of lymphedema from removal of 3 lymph nodes.  Lymphadenopathy:    She has no cervical adenopathy.  Neurological: She is alert and oriented to person, place, and time. She has normal reflexes. No cranial nerve deficit.  Skin: Skin is warm and dry. No rash noted.  Psychiatric: She has a normal mood and affect. Her behavior is normal. Judgment and thought content normal.  Nursing note and vitals reviewed.  BP 127/67 (BP Location: Left Arm)   Pulse 64   Temp 97.7 F (36.5 C) (Oral)   Ht 5\' 3"  (1.6 m)   Wt 150 lb (68 kg)   BMI 26.57 kg/m         Assessment & Plan:  1. Essential hypertension -The blood pressure is good today and the patient will continue with her HCTZ -She will also continue with her potassium until the BMP is returned  2. Vitamin D deficiency -Continue with vitamin D replacement pending results of lab work  3. Pure hypercholesterolemia -Continue with aggressive therapeutic lifestyle changes  4. Malignant neoplasm of left female breast, unspecified estrogen receptor status, unspecified site of breast ([place]) -Continue to follow-up with oncology  5. Tick bite, initial encounter -Take doxycycline 100 mg twice daily with food for 3 weeks -Use deep [place] off and check body closely after being out in the yard for tick removal - [place] m[place] spotted fvr abs pnl(IgG+IgM) - Lyme Ab/Western Blot Reflex  Meds ordered this encounter  Medications  . doxycycline (VIBRA-TABS) 100 MG tablet    Sig: Take 1 tablet (100 mg total) by mouth 2 (two) times daily. 1 po bid    Dispense:  42 tablet    Refill:  0   Patient Instructions  Continue current medications. Continue good therapeutic lifestyle changes which include good diet and exercise. Fall precautions discussed with patient. If an FOBT was given today- please return it to our front desk. If you are over  69 years old - you may need Prevnar 70 or the adult Pneumonia vaccine.  **Flu shots are available--- please call and schedule a FLU-CLINIC appointment**  After your visit with [name] today you will receive a survey in the mail or online from [employer] regarding your care with [name]. Please take a moment to fill this out. Your feedback is very important to [name] as you can help [name] better understand your patient needs as well as improve your experience and satisfaction. WE CARE ABOUT YOU!!!   Take antibiotic as directed Always check closely for tick bites and wear protective clothing and use deep [place] off if out in the yard or in the woods. This summer drink plenty of fluids and stay well hydrated Continue to follow-up with oncologist as doing  Arrie Senate MD

## 2017-05-22 LAB — LYME AB/WESTERN BLOT REFLEX
LYME DISEASE AB, QUANT, IGM: <0.8 index (ref 0.00–0.79)
Lyme IgG/IgM Ab: <0.91 {ISR} (ref 0.00–0.90)

## 2017-05-22 LAB — ROCKY MTN SPOTTED FVR ABS PNL(IGG+IGM)
RMSF IgG: NEGATIVE
RMSF IgM: 1.37 index — ABNORMAL HIGH (ref 0.00–0.89)

## 2017-05-24 LAB — LIPID PANEL
CHOL/HDL RATIO: 2.2 ratio (ref 0.0–4.4)
Cholesterol, Total: 193 mg/dL (ref 100–199)
HDL: 88 mg/dL (ref >39)
LDL Calculated: 90 mg/dL (ref 0–99)
Triglycerides: 75 mg/dL (ref 0–149)
VLDL CHOLESTEROL CAL: 15 mg/dL (ref 5–40)

## 2017-05-24 LAB — CMP14+EGFR
A/G RATIO: 2 (ref 1.2–2.2)
ALT: 13 IU/L (ref 0–32)
AST: 19 IU/L (ref 0–40)
Albumin: 4.4 g/dL (ref 3.6–4.8)
Alkaline Phosphatase: 70 IU/L (ref 39–117)
BUN / CREAT RATIO: 15 (ref 12–28)
BUN: 11 mg/dL (ref 8–27)
Bilirubin Total: 0.3 mg/dL (ref 0.0–1.2)
CALCIUM: 9.1 mg/dL (ref 8.7–10.3)
CO2: 23 mmol/L (ref 20–29)
Chloride: 100 mmol/L (ref 96–106)
Creatinine, Ser: 0.73 mg/dL (ref 0.57–1.00)
GFR calc Af Amer: 104 mL/min/{1.73_m2} (ref >59)
GFR, EST NON AFRICAN AMERICAN: 90 mL/min/{1.73_m2} (ref >59)
GLOBULIN, TOTAL: 2.2 g/dL (ref 1.5–4.5)
Glucose: 81 mg/dL (ref 65–99)
Potassium: 3.5 mmol/L (ref 3.5–5.2)
SODIUM: 142 mmol/L (ref 134–144)
Total Protein: 6.6 g/dL (ref 6.0–8.5)

## 2017-05-24 LAB — SPECIMEN STATUS REPORT

## 2017-09-26 ENCOUNTER — Ambulatory Visit (INDEPENDENT_AMBULATORY_CARE_PROVIDER_SITE_OTHER): Payer: Managed Care, Other (non HMO) | Admitting: Family Medicine

## 2017-09-26 ENCOUNTER — Encounter: Payer: Self-pay | Admitting: Family Medicine

## 2017-09-26 VITALS — BP 138/72 | HR 70 | Temp 98.6°F | Ht 63.0 in | Wt 150.0 lb

## 2017-09-26 DIAGNOSIS — E876 Hypokalemia: Secondary | ICD-10-CM

## 2017-09-26 DIAGNOSIS — Z23 Encounter for immunization: Secondary | ICD-10-CM

## 2017-09-26 DIAGNOSIS — E559 Vitamin D deficiency, unspecified: Secondary | ICD-10-CM

## 2017-09-26 DIAGNOSIS — E7800 Pure hypercholesterolemia, unspecified: Secondary | ICD-10-CM

## 2017-09-26 DIAGNOSIS — C50912 Malignant neoplasm of unspecified site of left female breast: Secondary | ICD-10-CM

## 2017-09-26 DIAGNOSIS — I10 Essential (primary) hypertension: Secondary | ICD-10-CM | POA: Diagnosis not present

## 2017-09-26 NOTE — Progress Notes (Signed)
Subjective:    Patient ID: Cindy Blake, female    DOB: 03-20-1956, 60 y.o.   MRN: [id number]  HPI Pt here for follow up and management of chronic medical problems which includes hyperlipidemia and hypertension. She is taking medication regularly.  The patient has recently had a diagnosis of H. pylori.  She was placed on amoxicillin clarithromycin and asked to increase her pantoprazole to twice daily.  This was done by her gastroenterologist Dr. Eusebio Friendly.  The patient is doing well with no particular complaints.  She continues to be followed by her oncologist because of the history of malignant neoplasm of the left female breast.  She is also followed by her gastroenterologist.  Patient has no particular complaints today.  She had her pelvic exam done yesterday.  She also had a DEXA scan.  She will get lab work today and her flu shot today.  Her blood pressure was slightly elevated at 147/68 initially.  The patient has appointments with Dr. Abran Duke in December her new oncologist and a follow-up visit with her gastroenterologist in December also.  She is about 2-1/2 years out from the diagnosis of the breast cancer.  She did have 3 nodes removed and all were negative arm and she wears  elastic support.  She denies any chest pain or shortness of breath.  She denies any trouble now with her stomach after taking the treatment for H. pylori.  She is still on the pantoprazole.  She also had an endoscopy with a colonoscopy and will not be due another anoscopy for 10 years.  She has no trouble passing her water.  She had a pelvic exam yesterday and this was good.  She also had her eye exam early this month and this was good also.  A repeat blood pressure today was 138/72.    Patient Active Problem List   Diagnosis Date Noted  . Malignant neoplasm of left female breast ([place]) 05/01/2016  . Cancer of upper-inner quadrant of female breast ([place]) 02/01/2015  . Generalized anxiety disorder 11/10/2013  .  Allergic rhinitis 11/10/2013  . Hyperlipemia 07/06/2013  . Hypertension 07/06/2013  . Vitamin D deficiency 07/06/2013   Outpatient Encounter Prescriptions as of 09/26/2017  Medication Sig  . ALPRAZolam (XANAX) 1 MG tablet TAKE 1/2 TO 1 TABLET EVERY MORNING & BEDTIME AS NEEDED FOR ANXIETY & SLEEP  . aspirin 81 MG tablet Take 81 mg by mouth daily.  Marland Kitchen azelastine (ASTELIN) 0.1 % nasal spray Use in each nostril as directed 1-2 sprays  . Calcium Carbonate (CALCIUM 600 PO) Take 1 tablet by mouth daily.  . Cholecalciferol (VITAMIN D) 2000 UNITS CAPS Take 1 capsule by mouth daily.  . fexofenadine (ALLEGRA) 180 MG tablet Take 180 mg by mouth daily. Reported on 12/28/2015  . fluticasone (FLONASE) 50 MCG/ACT nasal spray USE 1 TO 2 SPRAYS IN EACH NOSTRIL ONCE DAILY AT BEDTIME  . hydrochlorothiazide (HYDRODIURIL) 25 MG tablet TAKE 1 TABLET ONCE A DAY  . losartan (COZAAR) 50 MG tablet TAKE 1 TABLET DAILY  . Multiple Vitamin (MULTIVITAMIN) tablet Take 1 tablet by mouth daily.  . potassium chloride (K-DUR,KLOR-CON) 10 MEQ tablet AT THE START OF THERAPY TAKE 1 TABLET TWICE A DAY FOR 1 WEEK THEN 1 TABLET DAILY THEREAFTER  . tamoxifen (NOLVADEX) 20 MG tablet Take 20 mg by mouth daily.  . [DISCONTINUED] doxycycline (VIBRA-TABS) 100 MG tablet Take 1 tablet (100 mg total) by mouth 2 (two) times daily. 1 po bid   No facility-administered  encounter medications on file as of 09/26/2017.      Review of Systems  Constitutional: Negative.   HENT: Negative.   Eyes: Negative.   Respiratory: Negative.   Cardiovascular: Negative.   Gastrointestinal: Negative.   Endocrine: Negative.   Genitourinary: Negative.   Musculoskeletal: Negative.   Skin: Negative.   Allergic/Immunologic: Negative.   Neurological: Negative.   Hematological: Negative.   Psychiatric/Behavioral: Negative.        Objective:   Physical Exam  Constitutional: She is oriented to person, place, and time. She appears well-developed and  well-nourished. No distress.  Pleasant and alert and in good spirits  HENT:  Head: Normocephalic and atraumatic.  Right Ear: External ear normal.  Left Ear: External ear normal.  Nose: Nose normal.  Mouth/Throat: Oropharynx is clear and moist. No oropharyngeal exudate.  Eyes: Pupils are equal, round, and reactive to light. Conjunctivae and EOM are normal. Right eye exhibits no discharge. Left eye exhibits no discharge. No scleral icterus.  Neck: Normal range of motion. Neck supple. No thyromegaly present.  No bruits thyromegaly or anterior cervical adenopathy  Cardiovascular: Normal rate, regular rhythm, normal heart sounds and intact distal pulses.   No murmur heard. Heart had a regular rate and rhythm at 72/min  Pulmonary/Chest: Effort normal and breath sounds normal. No respiratory distress. She has no wheezes. She has no rales.  Clear anteriorly and posteriorly  Abdominal: Soft. Bowel sounds are normal. She exhibits no mass. There is no tenderness. There is no rebound and no guarding.  Slight epigastric tenderness with no liver or spleen enlargement no bruits and no masses.  No inguinal adenopathy with good inguinal pulses.  Musculoskeletal: Normal range of motion. She exhibits no edema.  Lymphedema left arm with elastic support brace  Lymphadenopathy:    She has no cervical adenopathy.  Neurological: She is alert and oriented to person, place, and time. She has normal reflexes. No cranial nerve deficit.  Skin: Skin is warm and dry. No rash noted.  Psychiatric: She has a normal mood and affect. Her behavior is normal. Judgment and thought content normal.  Nursing note and vitals reviewed.   BP (!) 147/68 (BP Location: Left Arm)   Pulse 70   Temp 98.6 F (37 C) (Oral)   Ht 5\' 3"  (1.6 m)   Wt 150 lb (68 kg)   BMI 26.57 kg/m   Repeat blood pressure was 138/72 left arm sitting large cuff     Assessment & Plan:  1. Essential hypertension -Continue current blood pressure  medication and watch sodium intake - BMP8+EGFR - CBC with Differential/Platelet - Hepatic function panel  2. Vitamin D deficiency -Continue vitamin D replacement pending results of lab work - CBC with Differential/Platelet - VITAMIN D 25 Hydroxy (Vit-D Deficiency, Fractures)  3. Pure hypercholesterolemia -Continue aggressive therapeutic lifestyle changes with diet and exercise for cholesterol pending results of lab work - CBC with Differential/Platelet - Lipid panel  4. Need for immunization against influenza - Flu Vaccine QUAD 36+ mos IM  5. Malignant neoplasm of left female breast, unspecified estrogen receptor status, unspecified site of breast [facility]) -Follow-up with oncology as planned  Patient Instructions  Continue current medications. Continue good therapeutic lifestyle changes which include good diet and exercise. Fall precautions discussed with patient. If an FOBT was given today- please return it to our front desk. If you are over 55 years old - you may need Prevnar 13 or the adult Pneumonia vaccine.  **Flu shots are available--- please call and  schedule a FLU-CLINIC appointment**  After your visit with [name] today you will receive a survey in the mail or online from [employer] regarding your care with [name]. Please take a moment to fill this out. Your feedback is very important to [name] as you can help [name] better understand your patient needs as well as improve your experience and satisfaction. WE CARE ABOUT YOU!!!  Check on [facility] - (shingles shot) Continue to follow-up with oncology as planned Follow-up with gastroenterology as planned   Arrie Senate MD

## 2017-09-26 NOTE — Patient Instructions (Addendum)
Continue current medications. Continue good therapeutic lifestyle changes which include good diet and exercise. Fall precautions discussed with patient. If an FOBT was given today- please return it to our front desk. If you are over 61 years old - you may need Prevnar 3 or the adult Pneumonia vaccine.  **Flu shots are available--- please call and schedule a FLU-CLINIC appointment**  After your visit with [name] today you will receive a survey in the mail or online from [employer] regarding your care with [name]. Please take a moment to fill this out. Your feedback is very important to [name] as you can help [name] better understand your patient needs as well as improve your experience and satisfaction. WE CARE ABOUT YOU!!!  Check on [facility] - (shingles shot) Continue to follow-up with oncology as planned Follow-up with gastroenterology as planned

## 2017-09-27 LAB — HEPATIC FUNCTION PANEL
ALBUMIN: 4.3 g/dL (ref 3.6–4.8)
ALT: 11 IU/L (ref 0–32)
AST: 21 IU/L (ref 0–40)
Alkaline Phosphatase: 62 IU/L (ref 39–117)
BILIRUBIN TOTAL: 0.3 mg/dL (ref 0.0–1.2)
Bilirubin, Direct: 0.12 mg/dL (ref 0.00–0.40)
Total Protein: 6.4 g/dL (ref 6.0–8.5)

## 2017-09-27 LAB — BMP8+EGFR
BUN/Creatinine Ratio: 16 (ref 12–28)
BUN: 11 mg/dL (ref 8–27)
CALCIUM: 9.2 mg/dL (ref 8.7–10.3)
CHLORIDE: 100 mmol/L (ref 96–106)
CO2: 26 mmol/L (ref 20–29)
Creatinine, Ser: 0.67 mg/dL (ref 0.57–1.00)
GFR calc non Af Amer: 95 mL/min/{1.73_m2} (ref >59)
GFR, EST AFRICAN AMERICAN: 110 mL/min/{1.73_m2} (ref >59)
Glucose: 81 mg/dL (ref 65–99)
POTASSIUM: 3.4 mmol/L — AB (ref 3.5–5.2)
SODIUM: 143 mmol/L (ref 134–144)

## 2017-09-27 LAB — CBC WITH DIFFERENTIAL/PLATELET
Basophils Absolute: 0 10*3/uL (ref 0.0–0.2)
Basos: 1 %
EOS (ABSOLUTE): 0.2 10*3/uL (ref 0.0–0.4)
Eos: 3 %
Hematocrit: 36.9 % (ref 34.0–46.6)
Hemoglobin: 12.4 g/dL (ref 11.1–15.9)
IMMATURE GRANS (ABS): 0 10*3/uL (ref 0.0–0.1)
Immature Granulocytes: 0 %
LYMPHS ABS: 1.5 10*3/uL (ref 0.7–3.1)
LYMPHS: 28 %
MCH: 29.9 pg (ref 26.6–33.0)
MCHC: 33.6 g/dL (ref 31.5–35.7)
MCV: 89 fL (ref 79–97)
MONOS ABS: 0.4 10*3/uL (ref 0.1–0.9)
Monocytes: 8 %
NEUTROS ABS: 3.3 10*3/uL (ref 1.4–7.0)
Neutrophils: 60 %
PLATELETS: 230 10*3/uL (ref 150–379)
RBC: 4.15 x10E6/uL (ref 3.77–5.28)
RDW: 13 % (ref 12.3–15.4)
WBC: 5.4 10*3/uL (ref 3.4–10.8)

## 2017-09-27 LAB — LIPID PANEL
Chol/HDL Ratio: 2.4 ratio (ref 0.0–4.4)
Cholesterol, Total: 198 mg/dL (ref 100–199)
HDL: 84 mg/dL (ref >39)
LDL Calculated: 103 mg/dL — ABNORMAL HIGH (ref 0–99)
Triglycerides: 53 mg/dL (ref 0–149)
VLDL Cholesterol Cal: 11 mg/dL (ref 5–40)

## 2017-09-27 LAB — VITAMIN D 25 HYDROXY (VIT D DEFICIENCY, FRACTURES): Vit D, 25-Hydroxy: 49.9 ng/mL (ref 30.0–100.0)

## 2017-09-30 MED ORDER — POTASSIUM CHLORIDE CRYS ER 10 MEQ PO TBCR: 10.0000 meq | EXTENDED_RELEASE_TABLET | Freq: Two times a day (BID) | ORAL | 1 refills | Status: DC

## 2017-09-30 NOTE — Addendum Note (Signed)
Addended by: Thana Ates on: 09/30/2017 03:36 PM   Modules accepted: Orders

## 2017-11-13 ENCOUNTER — Other Ambulatory Visit: Payer: Managed Care, Other (non HMO)

## 2017-11-13 DIAGNOSIS — E876 Hypokalemia: Secondary | ICD-10-CM

## 2017-11-14 LAB — BMP8+EGFR
BUN/Creatinine Ratio: 17 (ref 12–28)
BUN: 14 mg/dL (ref 8–27)
CO2: 27 mmol/L (ref 20–29)
CREATININE: 0.81 mg/dL (ref 0.57–1.00)
Calcium: 8.8 mg/dL (ref 8.7–10.3)
Chloride: 98 mmol/L (ref 96–106)
GFR calc Af Amer: 91 mL/min/{1.73_m2} (ref >59)
GFR, EST NON AFRICAN AMERICAN: 79 mL/min/{1.73_m2} (ref >59)
GLUCOSE: 80 mg/dL (ref 65–99)
Potassium: 4 mmol/L (ref 3.5–5.2)
SODIUM: 144 mmol/L (ref 134–144)

## 2017-12-17 ENCOUNTER — Other Ambulatory Visit: Payer: Self-pay | Admitting: Family Medicine

## 2017-12-17 DIAGNOSIS — E876 Hypokalemia: Secondary | ICD-10-CM

## 2017-12-26 ENCOUNTER — Other Ambulatory Visit: Payer: Self-pay | Admitting: Family Medicine

## 2017-12-26 MED ORDER — LOSARTAN POTASSIUM 50 MG PO TABS: ORAL_TABLET | ORAL | 0 refills | Status: DC

## 2017-12-26 MED ORDER — HYDROCHLOROTHIAZIDE 25 MG PO TABS: ORAL_TABLET | ORAL | 0 refills | Status: DC

## 2017-12-26 NOTE — Telephone Encounter (Signed)
Pt aware RFs have sent to [place]

## 2017-12-26 NOTE — Telephone Encounter (Signed)
What is the name of the medication? Losartan 50mg  and HCTZ 25mg   Have you contacted your pharmacy to request a refill? No new pharmacy   Which pharmacy would you like this sent to? [facility] in [place]    Patient notified that their request is being sent to the clinical staff for review and that they should receive a call once it is complete. If they do not receive a call within 24 hours they can check with their pharmacy or our office.

## 2018-01-18 ENCOUNTER — Ambulatory Visit: Payer: Managed Care, Other (non HMO) | Admitting: Pediatrics

## 2018-01-18 ENCOUNTER — Encounter: Payer: Self-pay | Admitting: Pediatrics

## 2018-01-18 VITALS — BP 121/63 | HR 69 | Temp 97.6°F | Ht 63.0 in | Wt 147.4 lb

## 2018-01-18 DIAGNOSIS — R1013 Epigastric pain: Secondary | ICD-10-CM | POA: Diagnosis not present

## 2018-01-18 NOTE — Progress Notes (Signed)
  Subjective:   Patient ID: Cindy Blake, female    DOB: [birth year], 62 y.o.   MRN: [id number] CC: Abdominal Pain (feels bloated x 1 day); Back Pain; and Gas  HPI: Cindy Blake is a 62 y.o. female presenting for Abdominal Pain (feels bloated x 1 day); Back Pain; and Gas started feeling bad yesterday soon after lunch. Having some pain in her back across her back bra-line, stomach felt bloated. Stomach still felt bloated when she woke up this morning, feeling better now. Burping a lot.   Hasnt eaten anything since lunch yesterday due to gas/discomfort. Had some dry heaves yesterday soon after symptoms started. Had lean cuisine pizza for lunch which she hadnt had before.   Having regular bowel movements, last was last night. Was a little more loose than usual.   Restarted allegra 6 days ago bc of sinus symptoms, stopped nose sprays then. Has been taking mucinex regularly  Patient with history of breast cancer and mutation of CHEK2 gene which puts her at high risk for colon cancer.  Most recent endoscopy and colonoscopy were in September 2018.  She was Blake to have H. pylori gastritis that was treated with antibiotics in September.  She continued her pantoprazole until December 2018 at which point it was stopped as planned per GI notes to recheck for H. pylori should symptoms return.   Relevant past medical, surgical, family and social history reviewed. Allergies and medications reviewed and updated. Social History   Tobacco Use  Smoking Status Never Smoker  Smokeless Tobacco Never Used   ROS: Per HPI   Objective:    BP 121/63   Pulse 69   Temp 97.6 F (36.4 C) (Oral)   Ht '5\' 3"'$  (1.6 m)   Wt 147 lb 6.4 oz (66.9 kg)   BMI 26.11 kg/m   Wt Readings from Last 3 Encounters:  01/18/18 147 lb 6.4 oz (66.9 kg)  09/26/17 150 lb (68 kg)  05/20/17 150 lb (68 kg)    Gen: NAD, alert, cooperative with exam, NCAT EYES: EOMI, no conjunctival injection, or no icterus ENT:  TMs with white  effusion b/l, gray b/l, OP without erythema LYMPH: no cervical LAD CV: NRRR, normal S1/S2, no murmur, distal pulses 2+ b/l Resp: CTABL, no wheezes, normal WOB Abd: +BS, soft, mildly ttp upper abd, ND. no guarding or organomegaly Ext: No edema, warm Neuro: Alert and oriented, strength equal b/l UE and LE, coordination grossly normal MSK: normal muscle bulk  Assessment & Plan:  Cindy Blake was seen today for abdominal pain, back pain and gas.  Diagnoses and all orders for this visit:  Epigastric pain Take maalox, tums as needed for reflux symptoms. Check below. May need to see GI for H pylori breath test if ongoing symptoms -     Lipase -     CMP14+EGFR -     CBC with Differential/Platelet   Follow up plan: Return if symptoms worsen or fail to improve. Cindy Found, MD Cindy Blake

## 2018-01-19 LAB — CMP14+EGFR
A/G RATIO: 2.1 (ref 1.2–2.2)
ALT: 9 IU/L (ref 0–32)
AST: 16 IU/L (ref 0–40)
Albumin: 4.5 g/dL (ref 3.6–4.8)
Alkaline Phosphatase: 70 IU/L (ref 39–117)
BUN/Creatinine Ratio: 15 (ref 12–28)
BUN: 10 mg/dL (ref 8–27)
Bilirubin Total: 0.6 mg/dL (ref 0.0–1.2)
CALCIUM: 8.9 mg/dL (ref 8.7–10.3)
CO2: 24 mmol/L (ref 20–29)
Chloride: 102 mmol/L (ref 96–106)
Creatinine, Ser: 0.67 mg/dL (ref 0.57–1.00)
GFR calc Af Amer: 110 mL/min/{1.73_m2} (ref >59)
GFR, EST NON AFRICAN AMERICAN: 95 mL/min/{1.73_m2} (ref >59)
GLUCOSE: 127 mg/dL — AB (ref 65–99)
Globulin, Total: 2.1 g/dL (ref 1.5–4.5)
Potassium: 3.9 mmol/L (ref 3.5–5.2)
Sodium: 143 mmol/L (ref 134–144)
Total Protein: 6.6 g/dL (ref 6.0–8.5)

## 2018-01-19 LAB — CBC WITH DIFFERENTIAL/PLATELET
BASOS ABS: 0 10*3/uL (ref 0.0–0.2)
BASOS: 0 %
EOS (ABSOLUTE): 0 10*3/uL (ref 0.0–0.4)
Eos: 0 %
Hematocrit: 38.7 % (ref 34.0–46.6)
Hemoglobin: 12.9 g/dL (ref 11.1–15.9)
IMMATURE GRANS (ABS): 0 10*3/uL (ref 0.0–0.1)
IMMATURE GRANULOCYTES: 0 %
LYMPHS: 3 %
Lymphocytes Absolute: 0.5 10*3/uL — ABNORMAL LOW (ref 0.7–3.1)
MCH: 29.7 pg (ref 26.6–33.0)
MCHC: 33.3 g/dL (ref 31.5–35.7)
MCV: 89 fL (ref 79–97)
Monocytes Absolute: 0.9 10*3/uL (ref 0.1–0.9)
Monocytes: 7 %
NEUTROS PCT: 90 %
Neutrophils Absolute: 12.3 10*3/uL — ABNORMAL HIGH (ref 1.4–7.0)
PLATELETS: 210 10*3/uL (ref 150–379)
RBC: 4.35 x10E6/uL (ref 3.77–5.28)
RDW: 13.7 % (ref 12.3–15.4)
WBC: 13.6 10*3/uL — ABNORMAL HIGH (ref 3.4–10.8)

## 2018-01-19 LAB — LIPASE: LIPASE: 13 U/L — AB (ref 14–72)

## 2018-01-20 ENCOUNTER — Telehealth: Payer: Self-pay | Admitting: Family Medicine

## 2018-01-20 NOTE — Telephone Encounter (Signed)
Just for provider to know. No response required.

## 2018-01-23 ENCOUNTER — Telehealth: Payer: Self-pay | Admitting: Family Medicine

## 2018-01-27 NOTE — Telephone Encounter (Signed)
Called pt last week and discussed results.

## 2018-02-01 MED ORDER — METOPROLOL TARTRATE 5 MG/5ML IV SOLN
5.00 | INTRAVENOUS | Status: DC
Start: ? — End: 2018-02-01

## 2018-02-01 MED ORDER — HYDROMORPHONE HCL 1 MG/ML IJ SOLN
0.50 | INTRAMUSCULAR | Status: DC
Start: ? — End: 2018-02-01

## 2018-02-01 MED ORDER — DIPHENHYDRAMINE HCL 50 MG/ML IJ SOLN
12.50 | INTRAMUSCULAR | Status: DC
Start: ? — End: 2018-02-01

## 2018-02-01 MED ORDER — HEPARIN SODIUM (PORCINE) 5000 UNIT/ML IJ SOLN
INTRAMUSCULAR | Status: DC
Start: 2018-01-30 — End: 2018-02-01

## 2018-02-01 MED ORDER — GENERIC EXTERNAL MEDICATION
Status: DC
Start: ? — End: 2018-02-01

## 2018-02-01 MED ORDER — HYDROCODONE-ACETAMINOPHEN 5-325 MG PO TABS
ORAL_TABLET | ORAL | Status: DC
Start: ? — End: 2018-02-01

## 2018-02-01 MED ORDER — GENERIC EXTERNAL MEDICATION
3.38 | Status: DC
Start: 2018-01-30 — End: 2018-02-01

## 2018-02-01 MED ORDER — LOSARTAN POTASSIUM 50 MG PO TABS
50.00 | ORAL_TABLET | ORAL | Status: DC
Start: 2018-01-31 — End: 2018-02-01

## 2018-02-05 ENCOUNTER — Ambulatory Visit (INDEPENDENT_AMBULATORY_CARE_PROVIDER_SITE_OTHER): Payer: Managed Care, Other (non HMO)

## 2018-02-05 ENCOUNTER — Ambulatory Visit: Payer: Managed Care, Other (non HMO) | Admitting: Family Medicine

## 2018-02-05 ENCOUNTER — Encounter: Payer: Self-pay | Admitting: Family Medicine

## 2018-02-05 VITALS — BP 122/65 | HR 69 | Temp 97.6°F | Ht 63.0 in | Wt 138.0 lb

## 2018-02-05 DIAGNOSIS — I10 Essential (primary) hypertension: Secondary | ICD-10-CM

## 2018-02-05 DIAGNOSIS — R05 Cough: Secondary | ICD-10-CM | POA: Diagnosis not present

## 2018-02-05 DIAGNOSIS — R059 Cough, unspecified: Secondary | ICD-10-CM

## 2018-02-05 DIAGNOSIS — C50912 Malignant neoplasm of unspecified site of left female breast: Secondary | ICD-10-CM | POA: Diagnosis not present

## 2018-02-05 DIAGNOSIS — E7800 Pure hypercholesterolemia, unspecified: Secondary | ICD-10-CM

## 2018-02-05 DIAGNOSIS — Z9049 Acquired absence of other specified parts of digestive tract: Secondary | ICD-10-CM | POA: Diagnosis not present

## 2018-02-05 DIAGNOSIS — E559 Vitamin D deficiency, unspecified: Secondary | ICD-10-CM

## 2018-02-05 DIAGNOSIS — E876 Hypokalemia: Secondary | ICD-10-CM

## 2018-02-05 NOTE — Progress Notes (Signed)
Subjective:    Patient ID: Epifania Gore, female    DOB: [birth year], 62 y.o.   MRN: [id number]  HPI Pt here for follow up and management of chronic medical problems which includes hypertension and hyperlipidemia. She is taking medication regularly.  The patient is doing well overall she recently had a cholecystectomy done and she has had a cough for about 3 weeks.  She did have a CMP done at the time and the blood sugar was elevated at 127 she was not fasting.  Previous blood sugars have been good.  All liver function tests were normal.  White blood cell count was slightly elevated her hemoglobin and platelets were adequate.  Previously she has had a low potassium but the most recent one was normal.  The patient wants to wait on getting her lipids checked this time and her vitamin D.  She is due to get a chest x-ray.  She comes with her husband to the visit today.  She is just has some drains removed recently from the gallbladder surgery.  She is having some loose bowel movements.  She denies any chest pain but still has her ongoing cough.  She does not have any sputum production or color to the sputum.  Some the cough was going on before the gallbladder surgery.  She is also been taking a proton pump inhibitor and sucralfate but this is not helped to call either.  She denies any nausea or vomiting or blood in the stool and she is passing her water without problems.      Patient Active Problem List   Diagnosis Date Noted  . Malignant neoplasm of left female breast ([place]) 05/01/2016  . Cancer of upper-inner quadrant of female breast ([place]) 02/01/2015  . Generalized anxiety disorder 11/10/2013  . Allergic rhinitis 11/10/2013  . Hyperlipemia 07/06/2013  . Hypertension 07/06/2013  . Vitamin D deficiency 07/06/2013   Outpatient Encounter Medications as of 02/05/2018  Medication Sig  . ALPRAZolam (XANAX) 1 MG tablet TAKE 1/2 TO 1 TABLET EVERY MORNING & BEDTIME AS NEEDED FOR ANXIETY & SLEEP  .  aspirin 81 MG tablet Take 81 mg by mouth daily.  Marland Kitchen azelastine (ASTELIN) 0.1 % nasal spray Use in each nostril as directed 1-2 sprays  . Calcium Carbonate (CALCIUM 600 PO) Take 1 tablet by mouth daily.  . Cholecalciferol (VITAMIN D) 2000 UNITS CAPS Take 1 capsule by mouth daily.  . fexofenadine (ALLEGRA) 180 MG tablet Take 180 mg by mouth daily. Reported on 12/28/2015  . fluticasone (FLONASE) 50 MCG/ACT nasal spray USE 1 TO 2 SPRAYS IN EACH NOSTRIL ONCE DAILY AT BEDTIME  . hydrochlorothiazide (HYDRODIURIL) 25 MG tablet TAKE 1 TABLET ONCE A DAY  . losartan (COZAAR) 50 MG tablet TAKE 1 TABLET DAILY  . Multiple Vitamin (MULTIVITAMIN) tablet Take 1 tablet by mouth daily.  . potassium chloride (K-DUR,KLOR-CON) 10 MEQ tablet AT THE START OF THERAPY TAKE 1 TABLET TWICE A DAY FOR 1 WEEK THEN 1 TABLET DAILY THEREAFTER  . tamoxifen (NOLVADEX) 20 MG tablet Take 20 mg by mouth daily.   No facility-administered encounter medications on file as of 02/05/2018.      Review of Systems  Constitutional: Negative.   HENT: Negative.   Eyes: Negative.   Respiratory: Positive for cough.   Cardiovascular: Negative.   Gastrointestinal: Negative.        Abd soreness / gallbladder removed 01/28/18.  Endocrine: Negative.   Genitourinary: Negative.   Musculoskeletal: Negative.   Skin: Negative.  Allergic/Immunologic: Negative.   Neurological: Negative.   Hematological: Negative.   Psychiatric/Behavioral: Negative.        Objective:   Physical Exam  Constitutional: She is oriented to person, place, and time. She appears well-developed and well-nourished.  The patient is pleasant and alert and comes to the visit today with her husband because of the recent gallbladder surgery.  HENT:  Head: Normocephalic and atraumatic.  Right Ear: External ear normal.  Left Ear: External ear normal.  Mouth/Throat: Oropharynx is clear and moist. No oropharyngeal exudate.  Some nasal congestion and nasal septal irritation    Eyes: Conjunctivae and EOM are normal. Pupils are equal, round, and reactive to light. Right eye exhibits no discharge. Left eye exhibits no discharge. No scleral icterus.  Neck: Normal range of motion. Neck supple. No thyromegaly present.  No bruits thyromegaly or anterior cervical adenopathy  Cardiovascular: Normal rate, regular rhythm, normal heart sounds and intact distal pulses.  No murmur heard. Pulmonary/Chest: Effort normal and breath sounds normal. No respiratory distress. She has no wheezes. She has no rales.  Dry cough  Abdominal: Soft. Bowel sounds are normal. She exhibits no mass. There is no tenderness. There is no rebound and no guarding.  Abdominal tenderness secondary to multiple laparoscopic scars.  From recent cholecystectomy  Musculoskeletal: Normal range of motion. She exhibits no edema.  Lymphadenopathy:    She has no cervical adenopathy.  Neurological: She is alert and oriented to person, place, and time.  Skin: Skin is warm and dry. No rash noted.  Psychiatric: She has a normal mood and affect. Her behavior is normal. Judgment and thought content normal.  Nursing note and vitals reviewed.   BP 122/65 (BP Location: Left Arm)   Pulse 69   Temp 97.6 F (36.4 C) (Oral)   Ht 5\' 3"  (1.6 m)   Wt 138 lb (62.6 kg)   BMI 24.45 kg/m        Assessment & Plan:  1. Essential hypertension -The blood pressure is good today and she will continue with current treatment - DG Chest 2 View; Future  2. Vitamin D deficiency -Continue with vitamin D replacement  3. Pure hypercholesterolemia - DG Chest 2 View; Future  4. Cough -Take Mucinex maximum strength, 1 twice daily for cough and congestion - DG Chest 2 View; Future - CBC with Differential/Platelet  5. Status post cholecystectomy -Follow-up with surgeon as planned  6. Low serum potassium level -Patient has a history of hypokalemia and the most recent potassium was good.  She will continue with her potassium  replacement  7. Malignant neoplasm of left female breast, unspecified estrogen receptor status, unspecified site of breast [facility]) -Follow-up with oncology as planned  Patient Instructions  Continue current medications. Continue good therapeutic lifestyle changes which include good diet and exercise. Fall precautions discussed with patient. If an FOBT was given today- please return it to our front desk. If you are over 1 years old - you may need Prevnar 55 or the adult Pneumonia vaccine.  **Flu shots are available--- please call and schedule a FLU-CLINIC appointment**  After your visit with [name] today you will receive a survey in the mail or online from [employer] regarding your care with [name]. Please take a moment to fill this out. Your feedback is very important to [name] as you can help [name] better understand your patient needs as well as improve your experience and satisfaction. WE CARE ABOUT YOU!!!   We will call with the results of  the CBC and chest x-ray as soon as they become available Continue to drink plenty of fluids and stay well-hydrated Limit acid intake and drink drinks like 7-Up or ginger ale or Sprite And water Take Mucinex maximum strength, blue and white in color, 1 twice daily for cough and congestion with a large glass of water  Arrie Senate MD

## 2018-02-05 NOTE — Patient Instructions (Addendum)
Continue current medications. Continue good therapeutic lifestyle changes which include good diet and exercise. Fall precautions discussed with patient. If an FOBT was given today- please return it to our front desk. If you are over 62 years old - you may need Prevnar 5 or the adult Pneumonia vaccine.  **Flu shots are available--- please call and schedule a FLU-CLINIC appointment**  After your visit with [name] today you will receive a survey in the mail or online from [employer] regarding your care with [name]. Please take a moment to fill this out. Your feedback is very important to [name] as you can help [name] better understand your patient needs as well as improve your experience and satisfaction. WE CARE ABOUT YOU!!!   We will call with the results of the CBC and chest x-ray as soon as they become available Continue to drink plenty of fluids and stay well-hydrated Limit acid intake and drink drinks like 7-Up or ginger ale or Sprite And water Take Mucinex maximum strength, blue and white in color, 1 twice daily for cough and congestion with a large glass of water

## 2018-02-06 ENCOUNTER — Other Ambulatory Visit: Payer: Self-pay | Admitting: *Deleted

## 2018-02-06 LAB — CBC WITH DIFFERENTIAL/PLATELET
BASOS ABS: 0 10*3/uL (ref 0.0–0.2)
Basos: 0 %
EOS (ABSOLUTE): 0.5 10*3/uL — AB (ref 0.0–0.4)
Eos: 6 %
Hematocrit: 35.4 % (ref 34.0–46.6)
Hemoglobin: 11.8 g/dL (ref 11.1–15.9)
Immature Grans (Abs): 0 10*3/uL (ref 0.0–0.1)
Immature Granulocytes: 0 %
LYMPHS ABS: 1.6 10*3/uL (ref 0.7–3.1)
LYMPHS: 18 %
MCH: 28.6 pg (ref 26.6–33.0)
MCHC: 33.3 g/dL (ref 31.5–35.7)
MCV: 86 fL (ref 79–97)
MONOS ABS: 0.6 10*3/uL (ref 0.1–0.9)
Monocytes: 7 %
NEUTROS ABS: 6.4 10*3/uL (ref 1.4–7.0)
Neutrophils: 69 %
PLATELETS: 488 10*3/uL — AB (ref 150–379)
RBC: 4.13 x10E6/uL (ref 3.77–5.28)
RDW: 13.5 % (ref 12.3–15.4)
WBC: 9.2 10*3/uL (ref 3.4–10.8)

## 2018-02-06 MED ORDER — FLUCONAZOLE 150 MG PO TABS: 150.0000 mg | ORAL_TABLET | Freq: Once | ORAL | 0 refills | Status: AC

## 2018-02-06 MED ORDER — AMOXICILLIN-POT CLAVULANATE 875-125 MG PO TABS: 1.0000 | ORAL_TABLET | Freq: Two times a day (BID) | ORAL | 0 refills | Status: DC

## 2018-02-07 ENCOUNTER — Other Ambulatory Visit (INDEPENDENT_AMBULATORY_CARE_PROVIDER_SITE_OTHER): Payer: Managed Care, Other (non HMO)

## 2018-02-07 ENCOUNTER — Other Ambulatory Visit: Payer: Self-pay | Admitting: Family Medicine

## 2018-02-07 DIAGNOSIS — R911 Solitary pulmonary nodule: Secondary | ICD-10-CM

## 2018-03-18 ENCOUNTER — Other Ambulatory Visit: Payer: Self-pay | Admitting: Family Medicine

## 2018-03-26 ENCOUNTER — Other Ambulatory Visit: Payer: Self-pay | Admitting: Family Medicine

## 2018-05-28 ENCOUNTER — Other Ambulatory Visit: Payer: Self-pay | Admitting: Family Medicine

## 2018-05-28 DIAGNOSIS — E876 Hypokalemia: Secondary | ICD-10-CM

## 2018-06-05 ENCOUNTER — Encounter: Payer: Self-pay | Admitting: Family Medicine

## 2018-06-05 ENCOUNTER — Ambulatory Visit (INDEPENDENT_AMBULATORY_CARE_PROVIDER_SITE_OTHER): Payer: Managed Care, Other (non HMO) | Admitting: Family Medicine

## 2018-06-05 VITALS — BP 115/58 | HR 64 | Temp 97.4°F | Ht 63.0 in | Wt 145.0 lb

## 2018-06-05 DIAGNOSIS — E559 Vitamin D deficiency, unspecified: Secondary | ICD-10-CM | POA: Diagnosis not present

## 2018-06-05 DIAGNOSIS — E7800 Pure hypercholesterolemia, unspecified: Secondary | ICD-10-CM

## 2018-06-05 DIAGNOSIS — Z23 Encounter for immunization: Secondary | ICD-10-CM

## 2018-06-05 DIAGNOSIS — I10 Essential (primary) hypertension: Secondary | ICD-10-CM | POA: Diagnosis not present

## 2018-06-05 MED ORDER — ALPRAZOLAM 1 MG PO TABS: ORAL_TABLET | ORAL | 3 refills | Status: DC

## 2018-06-05 NOTE — Progress Notes (Signed)
Subjective:    Patient ID: Cindy Blake, female    DOB: [birth year], 62 y.o.   MRN: [id number]  HPI  Pt here for follow up and management of chronic medical problems which includes hypertension. She is taking medication regularly.  Patient is pleasant and positive and doing well.  She is followed regularly by her oncologist in [place] and her gynecologist that she is been seen for years has retired and she will be followed by another gynecologist in [place].  She gets her eye exams done yearly in the fall.  Today she denies any chest pain pressure tightness or shortness of breath.  She has no trouble with swallowing heartburn indigestion nausea vomiting diarrhea blood in the stool black tarry bowel movements or change in bowel habits.  Her father died of rectal cancer so she will need to continue to get colonoscopies every 5 years and the next one would come up again in 4 years.  She is aware of this and will keep a reminder for herself so she does not forget.  She is passing her water well.  She wears a sleeve for her lymphedema and wears this periodically depending on how much swelling she has in the arm.  She will get a Tdap today.  Her vital signs are stable and her weight is up a little bit.   Patient Active Problem List   Diagnosis Date Noted  . Malignant neoplasm of left female breast ([place]) 05/01/2016  . Cancer of upper-inner quadrant of female breast ([place]) 02/01/2015  . Generalized anxiety disorder 11/10/2013  . Allergic rhinitis 11/10/2013  . Hyperlipemia 07/06/2013  . Hypertension 07/06/2013  . Vitamin D deficiency 07/06/2013   Outpatient Encounter Medications as of 06/05/2018  Medication Sig  . ALPRAZolam (XANAX) 1 MG tablet TAKE 1/2 TO 1 TABLET EVERY MORNING & BEDTIME AS NEEDED FOR ANXIETY & SLEEP  . aspirin 81 MG tablet Take 81 mg by mouth daily.  Marland Kitchen azelastine (ASTELIN) 0.1 % nasal spray USE 1 TO 2 SPRAYS IN EACH NOSTRIL AS DIRECTED  . Calcium Carbonate (CALCIUM 600  PO) Take 1 tablet by mouth daily.  . Cholecalciferol (VITAMIN D) 2000 UNITS CAPS Take 1 capsule by mouth daily.  . fexofenadine (ALLEGRA) 180 MG tablet Take 180 mg by mouth daily. Reported on 12/28/2015  . fluticasone (FLONASE) 50 MCG/ACT nasal spray USE 1 TO 2 SPRAYS IN EACH NOSTRIL ONCE DAILY AT BEDTIME  . hydrochlorothiazide (HYDRODIURIL) 25 MG tablet TAKE 1 TABLET BY MOUTH EVERY DAY  . losartan (COZAAR) 50 MG tablet TAKE 1 TABLET BY MOUTH EVERY DAY  . Multiple Vitamin (MULTIVITAMIN) tablet Take 1 tablet by mouth daily.  . potassium chloride (K-DUR,KLOR-CON) 10 MEQ tablet AT THE START OF THERAPY TAKE 1 TABLET TWICE A DAY FOR 1 WEEK THEN 1 TABLET DAILY THEREAFTER  . tamoxifen (NOLVADEX) 20 MG tablet Take 20 mg by mouth daily.  . pantoprazole (PROTONIX) 40 MG tablet Take 1 tablet by mouth daily.  . [DISCONTINUED] amoxicillin-clavulanate (AUGMENTIN) 875-125 MG tablet Take 1 tablet by mouth 2 (two) times daily.   No facility-administered encounter medications on file as of 06/05/2018.       Review of Systems  Constitutional: Negative.   HENT: Negative.        Allergies / sneezing   Eyes: Negative.   Respiratory: Negative.   Cardiovascular: Negative.   Gastrointestinal: Negative.   Endocrine: Negative.   Genitourinary: Negative.   Musculoskeletal: Negative.   Skin: Negative.   Allergic/Immunologic: Negative.  Neurological: Negative.   Hematological: Negative.   Psychiatric/Behavioral: Negative.        Objective:   Physical Exam  Constitutional: She is oriented to person, place, and time. She appears well-developed and well-nourished. No distress.  The patient is pleasant calm and relaxed  HENT:  Head: Normocephalic and atraumatic.  Right Ear: External ear normal.  Left Ear: External ear normal.  Nose: Nose normal.  Mouth/Throat: Oropharynx is clear and moist. No oropharyngeal exudate.  Eyes: Pupils are equal, round, and reactive to light. Conjunctivae and EOM are normal.  Right eye exhibits no discharge. Left eye exhibits no discharge. No scleral icterus.  She gets her eye exams done yearly  Neck: Normal range of motion. Neck supple. No thyromegaly present.  No bruits thyromegaly or anterior cervical adenopathy  Cardiovascular: Normal rate, regular rhythm, normal heart sounds and intact distal pulses.  No murmur heard. Heart is regular at 72/min without murmurs  Pulmonary/Chest: Effort normal and breath sounds normal. She has no wheezes. She has no rales.  Clear anteriorly and posteriorly  Abdominal: Soft. Bowel sounds are normal. She exhibits no mass. There is no tenderness.  Minimal abdominal tenderness secondary to previous surgical scars.  No liver or spleen enlargement no epigastric tenderness no masses no bruits and no inguinal adenopathy  Musculoskeletal: Normal range of motion. She exhibits no edema.  Lymphadenopathy:    She has no cervical adenopathy.  Neurological: She is alert and oriented to person, place, and time. She has normal reflexes. No cranial nerve deficit.  Skin: Skin is warm and dry. No rash noted.  Psychiatric: She has a normal mood and affect. Her behavior is normal. Judgment and thought content normal.  The patient's mood affect and behavior are normal  Nursing note and vitals reviewed.  BP (!) 115/58 (BP Location: Right Arm)   Pulse 64   Temp (!) 97.4 F (36.3 C) (Oral)   Ht _0  (1.6 m)   Wt 145 lb (65.8 kg)   BMI 25.69 kg/m         Assessment & Plan:  1. Essential hypertension -Blood pressure is good today and she will continue with current treatment - BMP8+EGFR - CBC with Differential/Platelet - Hepatic function panel  2. Pure hypercholesterolemia -Continue with aggressive therapeutic lifestyle changes - CBC with Differential/Platelet - Lipid panel  3. Vitamin D deficiency -Continue with vitamin D replacement pending results of lab work - CBC with Differential/Platelet - VITAMIN D 25 Hydroxy (Vit-D  Deficiency, Fractures)  4. Need for Tdap vaccination - Tdap vaccine greater than or equal to 7yo IM  Meds ordered this encounter  Medications  . ALPRAZolam (XANAX) 1 MG tablet    Sig: TAKE 1/2 TO 1 TABLET BID AS NEEDED FOR ANXIETY & SLEEP    Dispense:  45 tablet    Refill:  3   Patient Instructions  Continue current medications. Continue good therapeutic lifestyle changes which include good diet and exercise. Fall precautions discussed with patient. If an FOBT was given today- please return it to our front desk. If you are over 24 years old - you may need Prevnar 21 or the adult Pneumonia vaccine.  **Flu shots are available--- please call and schedule a FLU-CLINIC appointment**  After your visit with [name] today you will receive a survey in the mail or online from [employer] regarding your care with [name]. Please take a moment to fill this out. Your feedback is very important to [name] as you can help [name] better understand  your patient needs as well as improve your experience and satisfaction. WE CARE ABOUT YOU!!!   Continue to follow-up with oncology Continue to follow-up with new gynecologist Keep dermatology appointment as planned Drink plenty of fluids and stay well-hydrated and do not put yourself at risk for falling  Arrie Senate MD

## 2018-06-05 NOTE — Patient Instructions (Addendum)
Continue current medications. Continue good therapeutic lifestyle changes which include good diet and exercise. Fall precautions discussed with patient. If an FOBT was given today- please return it to our front desk. If you are over 62 years old - you may need Prevnar 32 or the adult Pneumonia vaccine.  **Flu shots are available--- please call and schedule a FLU-CLINIC appointment**  After your visit with [name] today you will receive a survey in the mail or online from [employer] regarding your care with [name]. Please take a moment to fill this out. Your feedback is very important to [name] as you can help [name] better understand your patient needs as well as improve your experience and satisfaction. WE CARE ABOUT YOU!!!   Continue to follow-up with oncology Continue to follow-up with new gynecologist Keep dermatology appointment as planned Drink plenty of fluids and stay well-hydrated and do not put yourself at risk for falling

## 2018-06-06 LAB — CBC WITH DIFFERENTIAL/PLATELET
BASOS: 1 %
Basophils Absolute: 0 10*3/uL (ref 0.0–0.2)
EOS (ABSOLUTE): 0.2 10*3/uL (ref 0.0–0.4)
EOS: 3 %
HEMATOCRIT: 38 % (ref 34.0–46.6)
HEMOGLOBIN: 12.1 g/dL (ref 11.1–15.9)
IMMATURE GRANS (ABS): 0 10*3/uL (ref 0.0–0.1)
Immature Granulocytes: 0 %
Lymphocytes Absolute: 1.5 10*3/uL (ref 0.7–3.1)
Lymphs: 25 %
MCH: 28.1 pg (ref 26.6–33.0)
MCHC: 31.8 g/dL (ref 31.5–35.7)
MCV: 88 fL (ref 79–97)
MONOCYTES: 9 %
MONOS ABS: 0.5 10*3/uL (ref 0.1–0.9)
Neutrophils Absolute: 3.6 10*3/uL (ref 1.4–7.0)
Neutrophils: 62 %
Platelets: 195 10*3/uL (ref 150–450)
RBC: 4.31 x10E6/uL (ref 3.77–5.28)
RDW: 13.4 % (ref 12.3–15.4)
WBC: 5.8 10*3/uL (ref 3.4–10.8)

## 2018-06-06 LAB — HEPATIC FUNCTION PANEL
ALT: 14 IU/L (ref 0–32)
AST: 20 IU/L (ref 0–40)
Albumin: 4.3 g/dL (ref 3.6–4.8)
Alkaline Phosphatase: 66 IU/L (ref 39–117)
Bilirubin Total: 0.4 mg/dL (ref 0.0–1.2)
Bilirubin, Direct: 0.13 mg/dL (ref 0.00–0.40)
TOTAL PROTEIN: 6.5 g/dL (ref 6.0–8.5)

## 2018-06-06 LAB — LIPID PANEL
Chol/HDL Ratio: 2.1 ratio (ref 0.0–4.4)
Cholesterol, Total: 189 mg/dL (ref 100–199)
HDL: 88 mg/dL (ref >39)
LDL CALC: 90 mg/dL (ref 0–99)
Triglycerides: 54 mg/dL (ref 0–149)
VLDL CHOLESTEROL CAL: 11 mg/dL (ref 5–40)

## 2018-06-06 LAB — BMP8+EGFR
BUN / CREAT RATIO: 13 (ref 12–28)
BUN: 9 mg/dL (ref 8–27)
CHLORIDE: 102 mmol/L (ref 96–106)
CO2: 26 mmol/L (ref 20–29)
Calcium: 8.9 mg/dL (ref 8.7–10.3)
Creatinine, Ser: 0.69 mg/dL (ref 0.57–1.00)
GFR calc Af Amer: 109 mL/min/{1.73_m2} (ref >59)
GFR calc non Af Amer: 94 mL/min/{1.73_m2} (ref >59)
GLUCOSE: 87 mg/dL (ref 65–99)
Potassium: 3.4 mmol/L — ABNORMAL LOW (ref 3.5–5.2)
SODIUM: 142 mmol/L (ref 134–144)

## 2018-06-06 LAB — VITAMIN D 25 HYDROXY (VIT D DEFICIENCY, FRACTURES): VIT D 25 HYDROXY: 65.1 ng/mL (ref 30.0–100.0)

## 2018-06-15 ENCOUNTER — Other Ambulatory Visit: Payer: Self-pay | Admitting: Family Medicine

## 2018-07-15 ENCOUNTER — Other Ambulatory Visit: Payer: Managed Care, Other (non HMO)

## 2018-07-15 DIAGNOSIS — E876 Hypokalemia: Secondary | ICD-10-CM

## 2018-07-17 LAB — BASIC METABOLIC PANEL
BUN/Creatinine Ratio: 14 (ref 12–28)
BUN: 10 mg/dL (ref 8–27)
CALCIUM: 8.9 mg/dL (ref 8.7–10.3)
CHLORIDE: 103 mmol/L (ref 96–106)
CO2: 25 mmol/L (ref 20–29)
Creatinine, Ser: 0.71 mg/dL (ref 0.57–1.00)
GFR calc Af Amer: 106 mL/min/{1.73_m2} (ref >59)
GFR calc non Af Amer: 92 mL/min/{1.73_m2} (ref >59)
GLUCOSE: 82 mg/dL (ref 65–99)
POTASSIUM: 4.4 mmol/L (ref 3.5–5.2)
SODIUM: 143 mmol/L (ref 134–144)

## 2018-08-12 ENCOUNTER — Telehealth: Payer: Self-pay | Admitting: *Deleted

## 2018-08-12 DIAGNOSIS — E876 Hypokalemia: Secondary | ICD-10-CM

## 2018-08-12 MED ORDER — POTASSIUM CHLORIDE CRYS ER 10 MEQ PO TBCR: EXTENDED_RELEASE_TABLET | ORAL | 0 refills | Status: DC

## 2018-08-12 NOTE — Telephone Encounter (Signed)
LMOVM refill sent to [facility] 

## 2018-08-26 ENCOUNTER — Ambulatory Visit (INDEPENDENT_AMBULATORY_CARE_PROVIDER_SITE_OTHER): Payer: Managed Care, Other (non HMO) | Admitting: Family Medicine

## 2018-08-26 VITALS — BP 142/72 | HR 69 | Temp 98.7°F | Ht 63.0 in | Wt 147.0 lb

## 2018-08-26 DIAGNOSIS — J0100 Acute maxillary sinusitis, unspecified: Secondary | ICD-10-CM | POA: Diagnosis not present

## 2018-08-26 MED ORDER — AMOXICILLIN-POT CLAVULANATE 875-125 MG PO TABS: 1.0000 | ORAL_TABLET | Freq: Two times a day (BID) | ORAL | 0 refills | Status: DC

## 2018-08-26 NOTE — Progress Notes (Signed)
Subjective: CC: URI PCP: Chipper Herb, MD PNT:IRWERX Cindy Blake is a 62 y.o. female presenting to clinic today for:  1. URI Patient reports a 2-week history of upper respiratory symptoms.  She reports productive cough that seems to be getting somewhat better but seems to be predominant at nighttime.  She notes associated sinus pressure, particularly on the left side, postnasal drip and headache that has been ongoing for about 4 days.  She is used over-the-counter Mucinex and home prescription nasal sprays with little improvement in symptoms.  No fevers.  No purulence from nares.  No hemoptysis or wheeze.   ROS: Per HPI  Allergies  Allergen Reactions  . Shellfish-Derived Products Hives  . Sulfonamide Derivatives     REACTION: hives, itching, swelling   Past Medical History:  Diagnosis Date  . Allergy   . History of colon polyps   . Hypertension   . IBS (irritable bowel syndrome)   . Mitral valve disorder     Current Outpatient Medications:  .  ALPRAZolam (XANAX) 1 MG tablet, TAKE 1/2 TO 1 TABLET BID AS NEEDED FOR ANXIETY & SLEEP, Disp: 45 tablet, Rfl: 3 .  aspirin 81 MG tablet, Take 81 mg by mouth daily., Disp: , Rfl:  .  azelastine (ASTELIN) 0.1 % nasal spray, USE 1 TO 2 SPRAYS IN EACH NOSTRIL AS DIRECTED, Disp: 90 mL, Rfl: 1 .  Calcium Carbonate (CALCIUM 600 PO), Take 1 tablet by mouth daily., Disp: , Rfl:  .  Cholecalciferol (VITAMIN D) 2000 UNITS CAPS, Take 1 capsule by mouth daily., Disp: , Rfl:  .  fexofenadine (ALLEGRA) 180 MG tablet, Take 180 mg by mouth daily. Reported on 12/28/2015, Disp: , Rfl:  .  fluticasone (FLONASE) 50 MCG/ACT nasal spray, USE 1 TO 2 SPRAYS IN EACH NOSTRIL ONCE DAILY AT BEDTIME, Disp: 48 g, Rfl: 1 .  hydrochlorothiazide (HYDRODIURIL) 25 MG tablet, TAKE 1 TABLET BY MOUTH EVERY DAY, Disp: 90 tablet, Rfl: 1 .  losartan (COZAAR) 50 MG tablet, TAKE 1 TABLET BY MOUTH EVERY DAY, Disp: 90 tablet, Rfl: 1 .  Multiple Vitamin (MULTIVITAMIN) tablet, Take 1  tablet by mouth daily., Disp: , Rfl:  .  pantoprazole (PROTONIX) 40 MG tablet, Take 1 tablet by mouth daily., Disp: , Rfl: 3 .  potassium chloride (K-DUR,KLOR-CON) 10 MEQ tablet, AT THE START OF THERAPY TAKE 1 TABLET TWICE A DAY FOR 1 WEEK THEN 1 TABLET DAILY THEREAFTER, Disp: 90 tablet, Rfl: 0 .  tamoxifen (NOLVADEX) 20 MG tablet, Take 20 mg by mouth daily., Disp: , Rfl:  Social History   Socioeconomic History  . Marital status: Married    Spouse name: Not on file  . Number of children: Not on file  . Years of education: Not on file  . Highest education level: Not on file  Occupational History  . Not on file  Social Needs  . Financial resource strain: Not on file  . Food insecurity:    Worry: Not on file    Inability: Not on file  . Transportation needs:    Medical: Not on file    Non-medical: Not on file  Tobacco Use  . Smoking status: Never Smoker  . Smokeless tobacco: Never Used  Substance and Sexual Activity  . Alcohol use: Yes    Comment: rare  . Drug use: No  . Sexual activity: Not on file  Lifestyle  . Physical activity:    Days per week: Not on file    Minutes per session:  Not on file  . Stress: Not on file  Relationships  . Social connections:    Talks on phone: Not on file    Gets together: Not on file    Attends religious service: Not on file    Active member of club or organization: Not on file    Attends meetings of clubs or organizations: Not on file    Relationship status: Not on file  . Intimate partner violence:    Fear of current or ex partner: Not on file    Emotionally abused: Not on file    Physically abused: Not on file    Forced sexual activity: Not on file  Other Topics Concern  . Not on file  Social History Narrative  . Not on file   Family History  Problem Relation Age of Onset  . Hypertension Father   . Stroke Father   . Heart attack Father   . Arthritis Father     Objective: Office vital signs reviewed. BP (!) 142/72   Pulse  69   Temp 98.7 F (37.1 C) (Oral)   Ht 5\' 3"  (1.6 m)   Wt 147 lb (66.7 kg)   BMI 26.04 kg/m   Physical Examination:  General: Awake, alert, well nourished, nontoxic, No acute distress HEENT: Sinus tenderness to palpation present over the maxillary and frontal sinuses on the left greater than right.    Neck: No masses palpated. No lymphadenopathy    Ears: Tympanic membranes intact, normal light reflex, no erythema, no bulging    Eyes: PERRLA, extraocular membranes intact, sclera white    Nose: nasal turbinates moist, clear nasal discharge    Throat: moist mucus membranes, no erythema, no tonsillar exudate.  Airway is patent Cardio: regular rate and rhythm, S1S2 heard, no murmurs appreciated Pulm: clear to auscultation bilaterally, no wheezes, rhonchi or rales; normal work of breathing on room air  Assessment/ Plan: 62 y.o. female   1. Acute non-recurrent maxillary sinusitis Patient is afebrile nontoxic-appearing.  Her physical exam is remarkable for sinus tenderness to palpation, particularly on the left.  Given duration of symptoms, I have prescribed her Augmentin p.o. twice daily for the next 10 days.  Okay to continue home nasal sprays, Mucinex.  Reasons for return evaluation discussed with the patient.  She voiced good understanding and will follow-up PRN.  Meds ordered this encounter  Medications  . amoxicillin-clavulanate (AUGMENTIN) 875-125 MG tablet    Sig: Take 1 tablet by mouth 2 (two) times daily.    Dispense:  20 tablet    Refill:  [place], DO [place] [phone]

## 2018-08-26 NOTE — Patient Instructions (Signed)
Given the duration of your symptoms, I have placed an order for Augmentin.  Take this twice a day for the next 10 days.  I encourage you to use a probiotic or take yogurt daily to help reduce likelihood of diarrhea.  If symptoms worsen or do not improve as expected, please return for reevaluation.  You may continue your nasal sprays and Mucinex while taking this antibiotic.   Sinusitis, Adult Sinusitis is soreness and inflammation of your sinuses. Sinuses are hollow spaces in the bones around your face. Your sinuses are located:  Around your eyes.  In the middle of your forehead.  Behind your nose.  In your cheekbones.  Your sinuses and nasal passages are lined with a stringy fluid (mucus). Mucus normally drains out of your sinuses. When your nasal tissues become inflamed or swollen, the mucus can become trapped or blocked so air cannot flow through your sinuses. This allows bacteria, viruses, and funguses to grow, which leads to infection. Sinusitis can develop quickly and last for 7?10 days (acute) or for more than 12 weeks (chronic). Sinusitis often develops after a cold. What are the causes? This condition is caused by anything that creates swelling in the sinuses or stops mucus from draining, including:  Allergies.  Asthma.  Bacterial or viral infection.  Abnormally shaped bones between the nasal passages.  Nasal growths that contain mucus (nasal polyps).  Narrow sinus openings.  Pollutants, such as chemicals or irritants in the air.  A foreign object stuck in the nose.  A fungal infection. This is rare.  What increases the risk? The following factors may make you more likely to develop this condition:  Having allergies or asthma.  Having had a recent cold or respiratory tract infection.  Having structural deformities or blockages in your nose or sinuses.  Having a weak immune system.  Doing a lot of swimming or diving.  Overusing nasal  sprays.  Smoking.  What are the signs or symptoms? The main symptoms of this condition are pain and a feeling of pressure around the affected sinuses. Other symptoms include:  Upper toothache.  Earache.  Headache.  Bad breath.  Decreased sense of smell and taste.  A cough that may get worse at night.  Fatigue.  Fever.  Thick drainage from your nose. The drainage is often green and it may contain pus (purulent).  Stuffy nose or congestion.  Postnasal drip. This is when extra mucus collects in the throat or back of the nose.  Swelling and warmth over the affected sinuses.  Sore throat.  Sensitivity to light.  How is this diagnosed? This condition is diagnosed based on symptoms, a medical history, and a physical exam. To find out if your condition is acute or chronic, your health care provider may:  Look in your nose for signs of nasal polyps.  Tap over the affected sinus to check for signs of infection.  View the inside of your sinuses using an imaging device that has a light attached (endoscope).  If your health care provider suspects that you have chronic sinusitis, you may also:  Be tested for allergies.  Have a sample of mucus taken from your nose (nasal culture) and checked for bacteria.  Have a mucus sample examined to see if your sinusitis is related to an allergy.  If your sinusitis does not respond to treatment and it lasts longer than 8 weeks, you may have an MRI or CT scan to check your sinuses. These scans also help to  determine how severe your infection is. In rare cases, a bone biopsy may be done to rule out more serious types of fungal sinus disease. How is this treated? Treatment for sinusitis depends on the cause and whether your condition is chronic or acute. If a virus is causing your sinusitis, your symptoms will go away on their own within 10 days. You may be given medicines to relieve your symptoms, including:  Topical nasal decongestants.  They shrink swollen nasal passages and let mucus drain from your sinuses.  Antihistamines. These drugs block inflammation that is triggered by allergies. This can help to ease swelling in your nose and sinuses.  Topical nasal corticosteroids. These are nasal sprays that ease inflammation and swelling in your nose and sinuses.  Nasal saline washes. These rinses can help to get rid of thick mucus in your nose.  If your condition is caused by bacteria, you will be given an antibiotic medicine. If your condition is caused by a fungus, you will be given an antifungal medicine. Surgery may be needed to correct underlying conditions, such as narrow nasal passages. Surgery may also be needed to remove polyps. Follow these instructions at home: Medicines  Take, use, or apply over-the-counter and prescription medicines only as told by your health care provider. These may include nasal sprays.  If you were prescribed an antibiotic medicine, take it as told by your health care provider. Do not stop taking the antibiotic even if you start to feel better. Hydrate and Humidify  Drink enough water to keep your urine clear or pale yellow. Staying hydrated will help to thin your mucus.  Use a cool mist humidifier to keep the humidity level in your home above 50%.  Inhale steam for 10-15 minutes, 3-4 times a day or as told by your health care provider. You can do this in the bathroom while a hot shower is running.  Limit your exposure to cool or dry air. Rest  Rest as much as possible.  Sleep with your head raised (elevated).  Make sure to get enough sleep each night. General instructions  Apply a warm, moist washcloth to your face 3-4 times a day or as told by your health care provider. This will help with discomfort.  Wash your hands often with soap and water to reduce your exposure to viruses and other germs. If soap and water are not available, use hand sanitizer.  Do not smoke. Avoid being  around people who are smoking (secondhand smoke).  Keep all follow-up visits as told by your health care provider. This is important. Contact a health care provider if:  You have a fever.  Your symptoms get worse.  Your symptoms do not improve within 10 days. Get help right away if:  You have a severe headache.  You have persistent vomiting.  You have pain or swelling around your face or eyes.  You have vision problems.  You develop confusion.  Your neck is stiff.  You have trouble breathing. This information is not intended to replace advice given to you by your health care provider. Make sure you discuss any questions you have with your health care provider. Document Released: 11/26/2005 Document Revised: 07/22/2016 Document Reviewed: 09/21/2015 Elsevier Interactive Patient Education  [employer].

## 2018-09-27 ENCOUNTER — Ambulatory Visit (INDEPENDENT_AMBULATORY_CARE_PROVIDER_SITE_OTHER): Payer: Managed Care, Other (non HMO) | Admitting: Family Medicine

## 2018-09-27 VITALS — BP 131/68 | HR 66 | Temp 97.6°F | Ht 63.0 in | Wt 149.4 lb

## 2018-09-27 DIAGNOSIS — J329 Chronic sinusitis, unspecified: Secondary | ICD-10-CM | POA: Diagnosis not present

## 2018-09-27 DIAGNOSIS — J40 Bronchitis, not specified as acute or chronic: Secondary | ICD-10-CM | POA: Diagnosis not present

## 2018-09-27 MED ORDER — AMOXICILLIN-POT CLAVULANATE 875-125 MG PO TABS: 1.0000 | ORAL_TABLET | Freq: Two times a day (BID) | ORAL | 0 refills | Status: DC

## 2018-09-27 MED ORDER — PSEUDOEPHEDRINE-GUAIFENESIN ER 60-600 MG PO TB12: 1.0000 | ORAL_TABLET | Freq: Two times a day (BID) | ORAL | 0 refills | Status: DC

## 2018-09-27 NOTE — Progress Notes (Signed)
Chief Complaint  Patient presents with  . Cough    started Monday, productive, Delsym not helping  . Nasal Congestion    greenish     HPI  Patient presents today for Patient presents with upper respiratory congestion. Rhinorrhea that is frequently purulent. There is moderate sore throat. Patient reports coughing frequently as well.  No sputum noted. There is no fever, chills or sweats. The patient denies being short of breath. Onset was 5 days ago. Gradually worsening. Tried OTC mucinex without improvement.  PMH: Smoking status noted ROS: Per HPI  Objective: BP 131/68   Pulse 66   Temp 97.6 F (36.4 C) (Oral)   Ht 5\' 3"  (1.6 m)   Wt 149 lb 6.4 oz (67.8 kg)   BMI 26.47 kg/m  Gen: NAD, alert, cooperative with exam HEENT: NCAT, Nasal passages swollen, red TMS RED CV: RRR, good S1/S2, no murmur Resp: Bronchitis changes with scattered wheezes, non-labored Ext: No edema, warm Neuro: Alert and oriented, No gross deficits  Assessment and plan:  1. Sinobronchitis     Meds ordered this encounter  Medications  . amoxicillin-clavulanate (AUGMENTIN) 875-125 MG tablet    Sig: Take 1 tablet by mouth 2 (two) times daily. Take all of this medication    Dispense:  20 tablet    Refill:  0  . pseudoephedrine-guaifenesin (MUCINEX D) 60-600 MG 12 hr tablet    Sig: Take 1 tablet by mouth every 12 (twelve) hours for 14 days. As needed for congestion    Dispense:  20 tablet    Refill:  0    No orders of the defined types were placed in this encounter.   Follow up as needed.  Claretta Fraise, MD

## 2018-10-08 ENCOUNTER — Ambulatory Visit (INDEPENDENT_AMBULATORY_CARE_PROVIDER_SITE_OTHER): Payer: Managed Care, Other (non HMO) | Admitting: Family Medicine

## 2018-10-08 ENCOUNTER — Encounter: Payer: Self-pay | Admitting: Family Medicine

## 2018-10-08 VITALS — BP 135/60 | HR 61 | Temp 98.3°F | Ht 63.0 in | Wt 147.0 lb

## 2018-10-08 DIAGNOSIS — Z23 Encounter for immunization: Secondary | ICD-10-CM | POA: Diagnosis not present

## 2018-10-08 DIAGNOSIS — I10 Essential (primary) hypertension: Secondary | ICD-10-CM

## 2018-10-08 DIAGNOSIS — E559 Vitamin D deficiency, unspecified: Secondary | ICD-10-CM | POA: Diagnosis not present

## 2018-10-08 DIAGNOSIS — E7800 Pure hypercholesterolemia, unspecified: Secondary | ICD-10-CM | POA: Diagnosis not present

## 2018-10-08 DIAGNOSIS — C50912 Malignant neoplasm of unspecified site of left female breast: Secondary | ICD-10-CM

## 2018-10-08 DIAGNOSIS — Z1211 Encounter for screening for malignant neoplasm of colon: Secondary | ICD-10-CM

## 2018-10-08 DIAGNOSIS — R05 Cough: Secondary | ICD-10-CM

## 2018-10-08 DIAGNOSIS — R059 Cough, unspecified: Secondary | ICD-10-CM

## 2018-10-08 MED ORDER — ALPRAZOLAM 1 MG PO TABS: ORAL_TABLET | ORAL | 5 refills | Status: DC

## 2018-10-08 MED ORDER — FLUCONAZOLE 150 MG PO TABS: 150.0000 mg | ORAL_TABLET | Freq: Once | ORAL | 1 refills | Status: AC

## 2018-10-08 NOTE — Patient Instructions (Addendum)
Continue current medications. Continue good therapeutic lifestyle changes which include good diet and exercise. Fall precautions discussed with patient. If an FOBT was given today- please return it to our front desk. If you are over 62 years old - you may need Prevnar 49 or the adult Pneumonia vaccine.  **Flu shots are available--- please call and schedule a FLU-CLINIC appointment**  After your visit with [name] today you will receive a survey in the mail or online from [employer] regarding your care with [name]. Please take a moment to fill this out. Your feedback is very important to [name] as you can help [name] better understand your patient needs as well as improve your experience and satisfaction. WE CARE ABOUT YOU!!!   Continue to drink plenty of fluids and stay well-hydrated Take plain Mucinex maximum strength, 1 tablet twice daily with a large glass of water Keep the house as cool as possible Use nasal saline frequently to each nostril through the day If that congestion and drainage do not get better in the next 3 to 4 weeks, please get back in touch with [name]. Follow-up with oncology as planned Take Pepcid AC regularly for the next 3 to 4 weeks to settle the heartburn down before breakfast and supper

## 2018-10-08 NOTE — Progress Notes (Signed)
Subjective:    Patient ID: Cindy Blake, female    DOB: 03/08/1956, 62 y.o.   MRN: [id number]  HPI Pt here for follow up and management of chronic medical problems which includes hypertension and hyperlipidemia. She is taking medication regularly.  The patient is doing well though continues to have a lingering cough.  She did bring back her FOBT today and will get lab work today.  She is requesting a refill on her Xanax.  She will also get a flu shot.  She was seen in the office on October 19 with a diagnosis of sinobronchitis and was given Augmentin to take for this.  She is also given Mucinex D.  She did have a chest x-ray and March of this year which was within normal limits.  Patient is doing well other than the lingering cough as mentioned from the sinobronchitis that she had a couple weeks ago.  She also has had some increasing heartburn and indigestion.  She denies any blood in the stool or black tarry bowel movements.  She has had a recent visit with her gynecologist and this is a new one since her old one has retired.  She also has an appointment coming up soon with her oncologist who she sees about every 6 months.  She denies any chest pain or shortness of breath other than that associated with a recent upper respiratory infection.  She denies any nausea vomiting diarrhea blood in the stool or black tarry bowel movements but does have the heartburn which we have already suggested that she take Pepcid AC for 4 may be the next 3 to 4 weeks.  She denies any trouble with passing her water.     Patient Active Problem List   Diagnosis Date Noted  . Malignant neoplasm of left female breast ([place]) 05/01/2016  . Cancer of upper-inner quadrant of female breast ([place]) 02/01/2015  . Generalized anxiety disorder 11/10/2013  . Allergic rhinitis 11/10/2013  . Hyperlipemia 07/06/2013  . Hypertension 07/06/2013  . Vitamin D deficiency 07/06/2013   Outpatient Encounter Medications as of 10/08/2018    Medication Sig  . ALPRAZolam (XANAX) 1 MG tablet TAKE 1/2 TO 1 TABLET BID AS NEEDED FOR ANXIETY & SLEEP  . aspirin 81 MG tablet Take 81 mg by mouth daily.  Marland Kitchen azelastine (ASTELIN) 0.1 % nasal spray USE 1 TO 2 SPRAYS IN EACH NOSTRIL AS DIRECTED  . Calcium Carbonate (CALCIUM 600 PO) Take 1 tablet by mouth daily.  . Cholecalciferol (VITAMIN D) 2000 UNITS CAPS Take 1 capsule by mouth daily.  . fexofenadine (ALLEGRA) 180 MG tablet Take 180 mg by mouth daily. Reported on 12/28/2015  . fluticasone (FLONASE) 50 MCG/ACT nasal spray USE 1 TO 2 SPRAYS IN EACH NOSTRIL ONCE DAILY AT BEDTIME  . hydrochlorothiazide (HYDRODIURIL) 25 MG tablet TAKE 1 TABLET BY MOUTH EVERY DAY  . losartan (COZAAR) 50 MG tablet TAKE 1 TABLET BY MOUTH EVERY DAY  . Multiple Vitamin (MULTIVITAMIN) tablet Take 1 tablet by mouth daily.  . pantoprazole (PROTONIX) 40 MG tablet Take 1 tablet by mouth daily.  . potassium chloride (K-DUR,KLOR-CON) 10 MEQ tablet AT THE START OF THERAPY TAKE 1 TABLET TWICE A DAY FOR 1 WEEK THEN 1 TABLET DAILY THEREAFTER  . tamoxifen (NOLVADEX) 20 MG tablet Take 20 mg by mouth daily.  . [DISCONTINUED] amoxicillin-clavulanate (AUGMENTIN) 875-125 MG tablet Take 1 tablet by mouth 2 (two) times daily. Take all of this medication  . [DISCONTINUED] pseudoephedrine-guaifenesin (MUCINEX D) 60-600 MG 12  hr tablet Take 1 tablet by mouth every 12 (twelve) hours for 14 days. As needed for congestion   No facility-administered encounter medications on file as of 10/08/2018.      Review of Systems  Constitutional: Negative.   HENT: Negative.   Eyes: Negative.   Respiratory: Positive for cough.   Cardiovascular: Negative.   Gastrointestinal: Negative.   Endocrine: Negative.   Genitourinary: Negative.        Yeast infection from recent antibiotics  Musculoskeletal: Negative.   Skin: Negative.   Allergic/Immunologic: Negative.   Neurological: Negative.   Hematological: Negative.   Psychiatric/Behavioral:  Negative.        Objective:   Physical Exam  Constitutional: She is oriented to person, place, and time. She appears well-developed and well-nourished. No distress.  The patient is calm pleasant and alert and in good spirits.  HENT:  Head: Normocephalic and atraumatic.  Right Ear: External ear normal.  Left Ear: External ear normal.  Mouth/Throat: Oropharynx is clear and moist.  Minimal redness in each nostril with no significant drainage noted in the nostrils or in the throat.  Eyes: Pupils are equal, round, and reactive to light. Conjunctivae and EOM are normal. Right eye exhibits no discharge. Left eye exhibits no discharge. No scleral icterus.  Recent eye exam revealed a small cataract for which she will continue to follow-up on  Neck: Normal range of motion. Neck supple. No thyromegaly present.  May be very tiny lymph node anteriorly on the left side otherwise no anterior cervical adenopathy and no thyromegaly and no posterior cervical adenopathy.  Cardiovascular: Normal rate, regular rhythm, normal heart sounds and intact distal pulses.  No murmur heard. Heart is regular at 60/min  Pulmonary/Chest: Effort normal and breath sounds normal. She has no wheezes. She has no rales.  Clear anteriorly and posteriorly with a dry cough  Abdominal: Soft. Bowel sounds are normal. She exhibits no mass. There is no tenderness. There is no rebound and no guarding.  Minimal epigastric tenderness without liver or spleen enlargement or inguinal adenopathy and normal bowel sounds  Musculoskeletal: Normal range of motion. She exhibits no edema.  Lymphadenopathy:    She has no cervical adenopathy.  Neurological: She is alert and oriented to person, place, and time. She has normal reflexes. No cranial nerve deficit.  Skin: Skin is warm and dry. No rash noted.  Psychiatric: She has a normal mood and affect. Her behavior is normal. Judgment and thought content normal.  The patient's mood affect and  behavior are all normal for her.  Nursing note and vitals reviewed.  BP 135/60 (BP Location: Right Arm)   Pulse 61   Temp 98.3 F (36.8 C) (Oral)   Ht '5\' 3"'$  (1.6 m)   Wt 147 lb (66.7 kg)   BMI 26.04 kg/m         Assessment & Plan:  1. Vitamin D deficiency -Continue with calcium and vitamin D replacement pending results of vitamin D level - CBC with Differential/Platelet - VITAMIN D 25 Hydroxy (Vit-D Deficiency, Fractures)  2. Pure hypercholesterolemia -Continue with as aggressive therapeutic lifestyle changes as possible including diet and exercise to help control cholesterol pending results of lab work - CBC with Differential/Platelet - Lipid panel  3. Essential hypertension -Continue with losartan and sodium restriction as much as possible - CBC with Differential/Platelet - BMP8+EGFR - Hepatic function panel  4. Screen for colon cancer -The FOBT card was brought in today. - Fecal occult blood, imunochemical  5. Cough -  Continue to drink plenty of fluids and take Mucinex maximum strength, plain 1 twice daily with a large glass of water  6. Malignant neoplasm of left female breast, unspecified estrogen receptor status, unspecified site of breast [facility]) -Follow-up as planned with oncology and gynecology.  Meds ordered this encounter  Medications  . fluconazole (DIFLUCAN) 150 MG tablet    Sig: Take 1 tablet (150 mg total) by mouth once for 1 dose. Repeat in 1 week if needed    Dispense:  2 tablet    Refill:  1  . ALPRAZolam (XANAX) 1 MG tablet    Sig: TAKE 1/2 TO 1 TABLET BID AS NEEDED FOR ANXIETY & SLEEP    Dispense:  45 tablet    Refill:  5   Patient Instructions  Continue current medications. Continue good therapeutic lifestyle changes which include good diet and exercise. Fall precautions discussed with patient. If an FOBT was given today- please return it to our front desk. If you are over 57 years old - you may need Prevnar 59 or the adult Pneumonia  vaccine.  **Flu shots are available--- please call and schedule a FLU-CLINIC appointment**  After your visit with [name] today you will receive a survey in the mail or online from [employer] regarding your care with [name]. Please take a moment to fill this out. Your feedback is very important to [name] as you can help [name] better understand your patient needs as well as improve your experience and satisfaction. WE CARE ABOUT YOU!!!   Continue to drink plenty of fluids and stay well-hydrated Take plain Mucinex maximum strength, 1 tablet twice daily with a large glass of water Keep the house as cool as possible Use nasal saline frequently to each nostril through the day If that congestion and drainage do not get better in the next 3 to 4 weeks, please get back in touch with [name]. Follow-up with oncology as planned Take Pepcid [facility] regularly for the next 3 to 4 weeks to settle the heartburn down before breakfast and supper  Arrie Senate MD

## 2018-10-09 LAB — CBC WITH DIFFERENTIAL/PLATELET
Basophils Absolute: 0.1 10*3/uL (ref 0.0–0.2)
Basos: 1 %
EOS (ABSOLUTE): 0.2 10*3/uL (ref 0.0–0.4)
Eos: 2 %
HEMATOCRIT: 37.9 % (ref 34.0–46.6)
HEMOGLOBIN: 12.7 g/dL (ref 11.1–15.9)
Immature Grans (Abs): 0 10*3/uL (ref 0.0–0.1)
Immature Granulocytes: 0 %
LYMPHS: 33 %
Lymphocytes Absolute: 2.2 10*3/uL (ref 0.7–3.1)
MCH: 29.9 pg (ref 26.6–33.0)
MCHC: 33.5 g/dL (ref 31.5–35.7)
MCV: 89 fL (ref 79–97)
MONOS ABS: 0.5 10*3/uL (ref 0.1–0.9)
Monocytes: 8 %
NEUTROS PCT: 56 %
Neutrophils Absolute: 3.7 10*3/uL (ref 1.4–7.0)
Platelets: 245 10*3/uL (ref 150–450)
RBC: 4.25 x10E6/uL (ref 3.77–5.28)
RDW: 12.1 % — ABNORMAL LOW (ref 12.3–15.4)
WBC: 6.6 10*3/uL (ref 3.4–10.8)

## 2018-10-09 LAB — BMP8+EGFR
BUN / CREAT RATIO: 14 (ref 12–28)
BUN: 10 mg/dL (ref 8–27)
CO2: 27 mmol/L (ref 20–29)
Calcium: 9 mg/dL (ref 8.7–10.3)
Chloride: 100 mmol/L (ref 96–106)
Creatinine, Ser: 0.72 mg/dL (ref 0.57–1.00)
GFR calc Af Amer: 104 mL/min/{1.73_m2} (ref >59)
GFR, EST NON AFRICAN AMERICAN: 90 mL/min/{1.73_m2} (ref >59)
Glucose: 87 mg/dL (ref 65–99)
Potassium: 3.6 mmol/L (ref 3.5–5.2)
Sodium: 142 mmol/L (ref 134–144)

## 2018-10-09 LAB — VITAMIN D 25 HYDROXY (VIT D DEFICIENCY, FRACTURES): Vit D, 25-Hydroxy: 78.3 ng/mL (ref 30.0–100.0)

## 2018-10-09 LAB — LIPID PANEL
CHOL/HDL RATIO: 2.3 ratio (ref 0.0–4.4)
CHOLESTEROL TOTAL: 179 mg/dL (ref 100–199)
HDL: 77 mg/dL (ref >39)
LDL CALC: 91 mg/dL (ref 0–99)
Triglycerides: 57 mg/dL (ref 0–149)
VLDL Cholesterol Cal: 11 mg/dL (ref 5–40)

## 2018-10-09 LAB — HEPATIC FUNCTION PANEL
ALBUMIN: 4.5 g/dL (ref 3.6–4.8)
ALK PHOS: 76 IU/L (ref 39–117)
ALT: 13 IU/L (ref 0–32)
AST: 18 IU/L (ref 0–40)
Bilirubin Total: 0.3 mg/dL (ref 0.0–1.2)
Bilirubin, Direct: 0.12 mg/dL (ref 0.00–0.40)
Total Protein: 6.6 g/dL (ref 6.0–8.5)

## 2018-10-10 LAB — FECAL OCCULT BLOOD, IMMUNOCHEMICAL: Fecal Occult Bld: NEGATIVE

## 2018-10-29 ENCOUNTER — Other Ambulatory Visit: Payer: Self-pay | Admitting: Family Medicine

## 2018-10-29 DIAGNOSIS — E876 Hypokalemia: Secondary | ICD-10-CM

## 2018-10-30 LAB — HM MAMMOGRAPHY

## 2018-12-01 ENCOUNTER — Other Ambulatory Visit: Payer: Self-pay | Admitting: Family Medicine

## 2018-12-06 ENCOUNTER — Ambulatory Visit (INDEPENDENT_AMBULATORY_CARE_PROVIDER_SITE_OTHER): Payer: Managed Care, Other (non HMO) | Admitting: Pediatrics

## 2018-12-06 ENCOUNTER — Encounter: Payer: Self-pay | Admitting: Pediatrics

## 2018-12-06 VITALS — BP 141/65 | HR 65 | Temp 97.4°F | Ht 63.0 in | Wt 145.0 lb

## 2018-12-06 DIAGNOSIS — J069 Acute upper respiratory infection, unspecified: Secondary | ICD-10-CM | POA: Diagnosis not present

## 2018-12-06 DIAGNOSIS — R05 Cough: Secondary | ICD-10-CM

## 2018-12-06 DIAGNOSIS — R059 Cough, unspecified: Secondary | ICD-10-CM

## 2018-12-06 MED ORDER — GUAIFENESIN-CODEINE 100-10 MG/5ML PO SOLN: 5.0000 mL | Freq: Three times a day (TID) | ORAL | 0 refills | Status: DC | PRN

## 2018-12-06 NOTE — Progress Notes (Signed)
  Subjective:   Patient ID: Cindy Blake, female    DOB: May 25, 1956, 62 y.o.   MRN: [id number] CC: Sinus Problem and Cough  HPI: ELLIN FITZGIBBONS is a 62 y.o. female   Having sinus pressure.  Some sinus drainage.  Dry cough, breathes in dry air and starts coughing more. No fevers.  Appetite has been fine.  No fevers that she knows of.  No ear pain. Ongoing symptoms about 6 days. No worsening, minimal improvement so far.   Relevant past medical, surgical, family and social history reviewed. Allergies and medications reviewed and updated. Social History   Tobacco Use  Smoking Status Never Smoker  Smokeless Tobacco Never Used   ROS: Per HPI   Objective:    BP (!) 141/65   Pulse 65   Temp (!) 97.4 F (36.3 C) (Oral)   Ht 5\' 3"  (1.6 m)   Wt 145 lb (65.8 kg)   BMI 25.69 kg/m   Wt Readings from Last 3 Encounters:  12/06/18 145 lb (65.8 kg)  10/08/18 147 lb (66.7 kg)  09/27/18 149 lb 6.4 oz (67.8 kg)    Gen: NAD, alert, cooperative with exam, NCAT EYES: EOMI, no conjunctival injection, or no icterus ENT:  TMs pearly gray b/l, OP without erythema LYMPH: no cervical LAD CV: NRRR, normal S1/S2, no murmur, distal pulses 2+ b/l Resp: CTABL, no wheezes, normal WOB Abd: +BS, soft, NTND.  Ext: No edema, warm Neuro: Alert and oriented, strength equal b/l UE and LE, coordination grossly normal MSK: normal muscle bulk  Assessment & Plan:  Latecia was seen today for sinus problem and cough.  Diagnoses and all orders for this visit:  Acute URI Symptoms may last for 14 days. Symptom care and return precautions discussed. Trial of prescription cough medicine.  Cough -     guaiFENesin-codeine 100-10 MG/5ML syrup; Take 5-10 mLs by mouth 3 (three) times daily as needed for cough.   Follow up plan: As needed Assunta Found, MD [place]

## 2018-12-06 NOTE — Patient Instructions (Signed)
Fever reducer and headache: tylenol and ibuprofen, can take together or alternating   Sinus pressure:  Nasal steroid such as flonase/fluticaone or nasocort daily Can also take daily antihistamine such as loratadine/claritin or cetirizine/zyrtec  Sinus rinses/irritation: Netipot or similar with distilled water 2-3 times a day to clear out sinuses or Normal saline nasal spray  Sore throat:  Throat lozenges chloroseptic spray  Stick with bland foods Drink lots of fluids

## 2018-12-17 ENCOUNTER — Other Ambulatory Visit: Payer: Self-pay | Admitting: Family Medicine

## 2018-12-23 ENCOUNTER — Telehealth: Payer: Self-pay | Admitting: *Deleted

## 2018-12-23 MED ORDER — LOSARTAN POTASSIUM 25 MG PO TABS: 50.0000 mg | ORAL_TABLET | Freq: Every day | ORAL | 1 refills | Status: DC

## 2018-12-23 NOTE — Telephone Encounter (Signed)
Fax from [facility] [place] Losartan 50 mg is on backorder Please advise if this can be changed to 25 mg 2 QD, if appropriate please send in new Rx

## 2018-12-23 NOTE — Telephone Encounter (Signed)
Done

## 2018-12-23 NOTE — Addendum Note (Signed)
Addended by: Zannie Cove on: 12/23/2018 03:49 PM   Modules accepted: Orders

## 2019-02-11 ENCOUNTER — Ambulatory Visit: Payer: BLUE CROSS/BLUE SHIELD | Admitting: Family Medicine

## 2019-02-11 ENCOUNTER — Encounter: Payer: Self-pay | Admitting: Family Medicine

## 2019-02-11 VITALS — BP 133/60 | HR 64 | Temp 97.6°F | Ht 63.0 in | Wt 144.0 lb

## 2019-02-11 DIAGNOSIS — B078 Other viral warts: Secondary | ICD-10-CM | POA: Diagnosis not present

## 2019-02-11 DIAGNOSIS — C50912 Malignant neoplasm of unspecified site of left female breast: Secondary | ICD-10-CM

## 2019-02-11 DIAGNOSIS — I10 Essential (primary) hypertension: Secondary | ICD-10-CM | POA: Diagnosis not present

## 2019-02-11 DIAGNOSIS — E559 Vitamin D deficiency, unspecified: Secondary | ICD-10-CM

## 2019-02-11 DIAGNOSIS — E7800 Pure hypercholesterolemia, unspecified: Secondary | ICD-10-CM | POA: Diagnosis not present

## 2019-02-11 DIAGNOSIS — J301 Allergic rhinitis due to pollen: Secondary | ICD-10-CM

## 2019-02-11 MED ORDER — ALPRAZOLAM 1 MG PO TABS: ORAL_TABLET | ORAL | 5 refills | Status: DC

## 2019-02-11 NOTE — Patient Instructions (Addendum)
  Continue current medications. Continue good therapeutic lifestyle changes which include good diet and exercise. Fall precautions discussed with patient. If an FOBT was given today- please return it to our front desk. If you are over 63 years old - you may need Prevnar 77 or the adult Pneumonia vaccine.  **Flu shots are available--- please call and schedule a FLU-CLINIC appointment**  After your visit with [name] today you will receive a survey in the mail or online from [employer] regarding your care with [name]. Please take a moment to fill this out. Your feedback is very important to [name] as you can help [name] better understand your patient needs as well as improve your experience and satisfaction. WE CARE ABOUT YOU!!!   Keep follow up with oncology  Use nasal saline and saline gel  Continue to drink plenty of fluids and stay well-hydrated Use coolmist humidification and keep the house as cool as possible Continue with Pepcid AC and periodically if more antacid relief is necessary take a Prilosec over-the-counter.

## 2019-02-11 NOTE — Progress Notes (Addendum)
Subjective:    Patient ID: Cindy Blake, female    DOB: January 04, 1956, 63 y.o.   MRN: [id number]  HPI Pt here for follow up and management of chronic medical problems which includes hypertension and hyperlipidemia. She is taking medication regularly.  This patient is having some problems with dizziness at times and increased sinus pressure.  She also has a skin lesion on her right elbow that she is concerned about.  She is requesting a refill on her Xanax today.  Her vital signs are stable and her weight is stable at 144 and previously was 145.  She continues to be followed by a new gynecologist.  She sees Dr. Abran Duke for her ongoing breast cancer care.  She denies any chest pain pressure tightness or shortness of breath.  She used to take Protonix but did not get this refilled and is currently taking Pepcid AC and does have occasional heartburn with the Pepcid AC.  We discussed the importance of taking H2 blockers versus proton pump inhibitors and she understands the difference and knows that if she gets to having a lot of trouble with a Pepcid AC that she could take some Prilosec over-the-counter.  She denies any blood in the stool or black tarry bowel movements.  Her last colonoscopy was an September 2018 and she is due to get another one in September 2023.  She is passing her water well.  The lesion on her right elbow appears to be a small warty type growth which we will do cryotherapy on today.  Her gynecologist is Dr. Willa Rough.    Patient Active Problem List   Diagnosis Date Noted  . Malignant neoplasm of left female breast ([place]) 05/01/2016  . Cancer of upper-inner quadrant of female breast ([place]) 02/01/2015  . Generalized anxiety disorder 11/10/2013  . Allergic rhinitis 11/10/2013  . Hyperlipemia 07/06/2013  . Hypertension 07/06/2013  . Vitamin D deficiency 07/06/2013   Outpatient Encounter Medications as of 02/11/2019  Medication Sig  . ALPRAZolam (XANAX) 1 MG tablet TAKE 1/2 TO 1 TABLET BID  AS NEEDED FOR ANXIETY & SLEEP  . aspirin 81 MG tablet Take 81 mg by mouth daily.  Marland Kitchen azelastine (ASTELIN) 0.1 % nasal spray USE 1 TO 2 SPRAYS IN EACH NOSTRIL AS DIRECTED  . Calcium Carbonate (CALCIUM 600 PO) Take 1 tablet by mouth daily.  . Cholecalciferol (VITAMIN D) 2000 UNITS CAPS Take 1 capsule by mouth daily.  . fexofenadine (ALLEGRA) 180 MG tablet Take 180 mg by mouth daily. Reported on 12/28/2015  . fluticasone (FLONASE) 50 MCG/ACT nasal spray USE 1 TO 2 SPRAYS IN EACH NOSTRIL ONCE DAILY AT BEDTIME  . hydrochlorothiazide (HYDRODIURIL) 25 MG tablet TAKE 1 TABLET BY MOUTH EVERY DAY  . losartan (COZAAR) 25 MG tablet Take 2 tablets (50 mg total) by mouth daily.  . Multiple Vitamin (MULTIVITAMIN) tablet Take 1 tablet by mouth daily.  . potassium chloride (KLOR-CON M10) 10 MEQ tablet TAKE 1 TABLET TWICE DAILY FOR 1 WEEK, THEN TAKE 1 TABLET DAILY THEREAFTER  . tamoxifen (NOLVADEX) 20 MG tablet Take 20 mg by mouth daily.  . [DISCONTINUED] guaiFENesin-codeine 100-10 MG/5ML syrup Take 5-10 mLs by mouth 3 (three) times daily as needed for cough.  . [DISCONTINUED] pantoprazole (PROTONIX) 40 MG tablet Take 1 tablet by mouth daily.   No facility-administered encounter medications on file as of 02/11/2019.       Review of Systems  Constitutional: Negative.   HENT: Positive for sinus pressure.   Eyes: Negative.  Respiratory: Negative.   Cardiovascular: Negative.   Gastrointestinal: Negative.   Endocrine: Negative.   Genitourinary: Negative.   Musculoskeletal: Negative.   Skin: Negative.        Lesion right elbow   Allergic/Immunologic: Negative.   Neurological: Positive for dizziness (at times ).  Hematological: Negative.   Psychiatric/Behavioral: Negative.        Objective:   Physical Exam Vitals signs and nursing note reviewed.  Constitutional:      General: She is not in acute distress.    Appearance: Normal appearance. She is well-developed and normal weight.     Comments: Patient  is calm pleasant and relaxed and somewhat worried about her husband.  HENT:     Head: Normocephalic and atraumatic.     Right Ear: Tympanic membrane, ear canal and external ear normal. There is no impacted cerumen.     Left Ear: Tympanic membrane and external ear normal. There is no impacted cerumen.     Nose: Congestion present.     Comments: Slight congestion bilaterally with the right nasal septum being a little bit more irritated than the left    Mouth/Throat:     Mouth: Mucous membranes are moist.     Pharynx: Oropharynx is clear. No oropharyngeal exudate or posterior oropharyngeal erythema.  Eyes:     General: No scleral icterus.       Right eye: No discharge.        Left eye: No discharge.     Conjunctiva/sclera: Conjunctivae normal.     Pupils: Pupils are equal, round, and reactive to light.  Neck:     Musculoskeletal: Normal range of motion and neck supple.     Thyroid: No thyromegaly.     Vascular: No carotid bruit or JVD.     Comments: No bruits thyromegaly or anterior cervical adenopathy Cardiovascular:     Rate and Rhythm: Normal rate and regular rhythm.     Pulses: Normal pulses.     Heart sounds: Normal heart sounds. No murmur.     Comments: The heart is regular at 72/min with good pedal pulses and no edema Pulmonary:     Effort: Pulmonary effort is normal.     Breath sounds: Normal breath sounds. No wheezing, rhonchi or rales.     Comments: Clear anteriorly and posteriorly Abdominal:     General: Bowel sounds are normal.     Palpations: Abdomen is soft. There is no mass.     Tenderness: There is no abdominal tenderness. There is no guarding.     Comments: The abdomen is soft without masses tenderness organ enlargement or bruits  Musculoskeletal: Normal range of motion.        General: No swelling.     Right lower leg: No edema.     Left lower leg: No edema.  Lymphadenopathy:     Cervical: No cervical adenopathy.  Skin:    General: Skin is warm and dry.      Findings: Lesion present. No rash.     Comments: No therapy to warty growth above the medial aspect of the left elbow.  Patient tolerated procedure well.  Neurological:     General: No focal deficit present.     Mental Status: She is alert and oriented to person, place, and time.     Cranial Nerves: No cranial nerve deficit.     Motor: No weakness.     Deep Tendon Reflexes: Reflexes are normal and symmetric. Reflexes normal.  Psychiatric:  Mood and Affect: Mood normal.        Behavior: Behavior normal.        Thought Content: Thought content normal.        Judgment: Judgment normal.     Comments: Mood affect and behavior were normal for this patient   addendum: pt DID receive cryo therapy today to her left elbow lesion and she tolerated it well.   BP 133/60 (BP Location: Right Arm)   Pulse 64   Temp 97.6 F (36.4 C) (Oral)   Ht _0  (1.6 m)   Wt 144 lb (65.3 kg)   BMI 25.51 kg/m       Assessment & Plan:  1. Essential hypertension -The blood pressure is good today and she will continue with current treatment.  Home readings are better. - BMP8+EGFR - CBC with Differential/Platelet - Hepatic function panel  2. Pure hypercholesterolemia -Continue with as aggressive therapeutic lifestyle changes as possible including diet and exercise for good control of cholesterol. - CBC with Differential/Platelet - Lipid panel  3. Vitamin D deficiency -Continue with vitamin D replacement pending results of lab work - CBC with Differential/Platelet - VITAMIN D 25 Hydroxy (Vit-D Deficiency, Fractures)  4. Malignant neoplasm of left female breast, unspecified estrogen receptor status, unspecified site of breast ([place]) -Continue to follow-up with Dr. Abran Duke  5. Seasonal allergic rhinitis due to pollen -Continue with Flonase nasal saline nasal saline gel lots of fluids and stay well-hydrated  Meds ordered this encounter  Medications  . ALPRAZolam (XANAX) 1 MG tablet    Sig: TAKE  1/2 TO 1 TABLET BID AS NEEDED FOR ANXIETY & SLEEP    Dispense:  45 tablet    Refill:  5   Patient Instructions   Continue current medications. Continue good therapeutic lifestyle changes which include good diet and exercise. Fall precautions discussed with patient. If an FOBT was given today- please return it to our front desk. If you are over 107 years old - you may need Prevnar 46 or the adult Pneumonia vaccine.  **Flu shots are available--- please call and schedule a FLU-CLINIC appointment**  After your visit with [name] today you will receive a survey in the mail or online from [employer] regarding your care with [name]. Please take a moment to fill this out. Your feedback is very important to [name] as you can help [name] better understand your patient needs as well as improve your experience and satisfaction. WE CARE ABOUT YOU!!!   Keep follow up with oncology  Use nasal saline and saline gel  Continue to drink plenty of fluids and stay well-hydrated Use coolmist humidification and keep the house as cool as possible Continue with Pepcid AC and periodically if more antacid relief is necessary take a Prilosec over-the-counter.   Arrie Senate MD

## 2019-02-12 LAB — HEPATIC FUNCTION PANEL
ALBUMIN: 4.7 g/dL (ref 3.8–4.8)
ALK PHOS: 81 IU/L (ref 39–117)
ALT: 13 IU/L (ref 0–32)
AST: 20 IU/L (ref 0–40)
BILIRUBIN TOTAL: 0.3 mg/dL (ref 0.0–1.2)
Bilirubin, Direct: 0.1 mg/dL (ref 0.00–0.40)
Total Protein: 6.8 g/dL (ref 6.0–8.5)

## 2019-02-12 LAB — BMP8+EGFR
BUN / CREAT RATIO: 13 (ref 12–28)
BUN: 9 mg/dL (ref 8–27)
CHLORIDE: 100 mmol/L (ref 96–106)
CO2: 25 mmol/L (ref 20–29)
Calcium: 9.1 mg/dL (ref 8.7–10.3)
Creatinine, Ser: 0.7 mg/dL (ref 0.57–1.00)
GFR calc Af Amer: 107 mL/min/{1.73_m2} (ref >59)
GFR calc non Af Amer: 93 mL/min/{1.73_m2} (ref >59)
GLUCOSE: 84 mg/dL (ref 65–99)
Potassium: 3.2 mmol/L — ABNORMAL LOW (ref 3.5–5.2)
SODIUM: 142 mmol/L (ref 134–144)

## 2019-02-12 LAB — LIPID PANEL
CHOLESTEROL TOTAL: 205 mg/dL — AB (ref 100–199)
Chol/HDL Ratio: 2.2 ratio (ref 0.0–4.4)
HDL: 93 mg/dL (ref >39)
LDL Calculated: 99 mg/dL (ref 0–99)
TRIGLYCERIDES: 67 mg/dL (ref 0–149)
VLDL Cholesterol Cal: 13 mg/dL (ref 5–40)

## 2019-02-12 LAB — CBC WITH DIFFERENTIAL/PLATELET
BASOS ABS: 0.1 10*3/uL (ref 0.0–0.2)
Basos: 1 %
EOS (ABSOLUTE): 0.2 10*3/uL (ref 0.0–0.4)
Eos: 3 %
Hematocrit: 40.3 % (ref 34.0–46.6)
Hemoglobin: 13.5 g/dL (ref 11.1–15.9)
Immature Grans (Abs): 0 10*3/uL (ref 0.0–0.1)
Immature Granulocytes: 0 %
LYMPHS ABS: 1.9 10*3/uL (ref 0.7–3.1)
Lymphs: 34 %
MCH: 29.9 pg (ref 26.6–33.0)
MCHC: 33.5 g/dL (ref 31.5–35.7)
MCV: 89 fL (ref 79–97)
MONOS ABS: 0.5 10*3/uL (ref 0.1–0.9)
Monocytes: 8 %
Neutrophils Absolute: 3 10*3/uL (ref 1.4–7.0)
Neutrophils: 54 %
Platelets: 190 10*3/uL (ref 150–450)
RBC: 4.51 x10E6/uL (ref 3.77–5.28)
RDW: 12.7 % (ref 11.7–15.4)
WBC: 5.7 10*3/uL (ref 3.4–10.8)

## 2019-02-12 LAB — VITAMIN D 25 HYDROXY (VIT D DEFICIENCY, FRACTURES): Vit D, 25-Hydroxy: 66.3 ng/mL (ref 30.0–100.0)

## 2019-04-27 ENCOUNTER — Other Ambulatory Visit: Payer: Self-pay | Admitting: Family Medicine

## 2019-04-27 DIAGNOSIS — E876 Hypokalemia: Secondary | ICD-10-CM

## 2019-05-23 ENCOUNTER — Other Ambulatory Visit: Payer: Self-pay | Admitting: Family Medicine

## 2019-05-23 DIAGNOSIS — E876 Hypokalemia: Secondary | ICD-10-CM

## 2019-05-28 ENCOUNTER — Other Ambulatory Visit: Payer: Self-pay | Admitting: Family Medicine

## 2019-05-28 ENCOUNTER — Other Ambulatory Visit: Payer: BC Managed Care – PPO

## 2019-05-28 ENCOUNTER — Other Ambulatory Visit: Payer: Self-pay

## 2019-05-28 ENCOUNTER — Other Ambulatory Visit: Payer: Self-pay | Admitting: *Deleted

## 2019-05-28 DIAGNOSIS — E876 Hypokalemia: Secondary | ICD-10-CM

## 2019-05-29 LAB — POTASSIUM: Potassium: 4.1 mmol/L (ref 3.5–5.2)

## 2019-06-02 DIAGNOSIS — Z79810 Long term (current) use of selective estrogen receptor modulators (SERMs): Secondary | ICD-10-CM | POA: Diagnosis not present

## 2019-06-02 DIAGNOSIS — C50212 Malignant neoplasm of upper-inner quadrant of left female breast: Secondary | ICD-10-CM | POA: Diagnosis not present

## 2019-06-02 DIAGNOSIS — Z79899 Other long term (current) drug therapy: Secondary | ICD-10-CM | POA: Diagnosis not present

## 2019-06-02 DIAGNOSIS — Z79811 Long term (current) use of aromatase inhibitors: Secondary | ICD-10-CM | POA: Diagnosis not present

## 2019-06-02 DIAGNOSIS — Z170 Estrogen receptor positive status [ER+]: Secondary | ICD-10-CM | POA: Diagnosis not present

## 2019-06-18 ENCOUNTER — Other Ambulatory Visit: Payer: Self-pay | Admitting: Family Medicine

## 2019-07-01 ENCOUNTER — Ambulatory Visit: Payer: BLUE CROSS/BLUE SHIELD | Admitting: Family Medicine

## 2019-07-01 ENCOUNTER — Other Ambulatory Visit: Payer: Self-pay | Admitting: Family Medicine

## 2019-07-01 ENCOUNTER — Other Ambulatory Visit: Payer: Self-pay

## 2019-07-01 DIAGNOSIS — E876 Hypokalemia: Secondary | ICD-10-CM

## 2019-07-02 ENCOUNTER — Ambulatory Visit: Payer: BLUE CROSS/BLUE SHIELD | Admitting: Family Medicine

## 2019-07-02 ENCOUNTER — Encounter: Payer: Self-pay | Admitting: Family Medicine

## 2019-07-02 VITALS — BP 129/63 | HR 66 | Temp 98.4°F | Ht 63.0 in | Wt 148.0 lb

## 2019-07-02 DIAGNOSIS — Z853 Personal history of malignant neoplasm of breast: Secondary | ICD-10-CM

## 2019-07-02 DIAGNOSIS — Z79899 Other long term (current) drug therapy: Secondary | ICD-10-CM

## 2019-07-02 DIAGNOSIS — E876 Hypokalemia: Secondary | ICD-10-CM

## 2019-07-02 DIAGNOSIS — F411 Generalized anxiety disorder: Secondary | ICD-10-CM | POA: Diagnosis not present

## 2019-07-02 MED ORDER — HYDROCHLOROTHIAZIDE 25 MG PO TABS: 25.0000 mg | ORAL_TABLET | Freq: Every day | ORAL | 3 refills | Status: DC

## 2019-07-02 MED ORDER — ALPRAZOLAM 1 MG PO TABS: ORAL_TABLET | ORAL | 5 refills | Status: DC

## 2019-07-02 MED ORDER — LOSARTAN POTASSIUM 25 MG PO TABS: 50.0000 mg | ORAL_TABLET | Freq: Every day | ORAL | 3 refills | Status: DC

## 2019-07-02 MED ORDER — POTASSIUM CHLORIDE CRYS ER 10 MEQ PO TBCR: EXTENDED_RELEASE_TABLET | ORAL | 1 refills | Status: DC

## 2019-07-02 NOTE — Patient Instructions (Signed)
We discussed that at some point we may consider discontinuing the hydrochlorothiazide and potassium and just increasing the losartan to reduce the amount of pills you have to take per day.  We discussed the risks of ongoing use of Xanax, particularly as you get older.  At some point we need to consider weaning off but I think for now since you are stable we can continue this medicine safely.

## 2019-07-02 NOTE — Progress Notes (Signed)
Subjective: CC: Anxiety disorder, hypertension, hypokalemia PCP: Janora Norlander, DO Cindy Blake is a 63 y.o. female presenting to clinic today for:  1.  Anxiety disorder Patient reports longstanding history of anxiety disorder.  She has been on Xanax nightly for quite some time now.  She notes that she does occasionally have anxiety symptoms during the day but this typically seems situational.  She has not been treated with other anti-anxiety medications previously.  She does have a strong family history of anxiety disorder in her mother.  She notes that she was hospitalized for a "nervous breakdown".  In the past.  Patient denies having had any personal hospitalizations for mental health.  No history of SI or HI.  Denies any excessive sedation or respiratory depression with use of benzodiazepine.  2.  History of breast cancer Patient with history of breast cancer.  This is status post lumpectomy in February 09, 2015.  She had invasive ductal carcinoma in situ.  She continues to be followed up with oncology, Dr. Abran Duke every 6 months.  She had her recent checkup in June.  She has mammogram scheduled for November.  She has a breast exam scheduled in October.  3.  Hypertension with hypokalemia Patient reports being treated with hydrochlorothiazide and losartan 25 mg daily.  She reports compliance with these medications.  She also takes potassium supplementation 2 tablets on Monday Wednesday Friday and 1 tablet daily all other days.  She has never gone on losartan alone to see how blood pressure would do and if hypokalemia would resolve.  No chest pain, shortness of breath, lower extremity edema.   ROS: Per HPI  Allergies  Allergen Reactions  . Shellfish-Derived Products Hives  . Sulfonamide Derivatives     REACTION: hives, itching, swelling   Past Medical History:  Diagnosis Date  . Allergy   . History of colon polyps   . Hypertension   . IBS (irritable bowel syndrome)   .  Mitral valve disorder     Current Outpatient Medications:  .  ALPRAZolam (XANAX) 1 MG tablet, TAKE 1/2 TO 1 TABLET BID AS NEEDED FOR ANXIETY & SLEEP, Disp: 45 tablet, Rfl: 5 .  aspirin 81 MG tablet, Take 81 mg by mouth daily., Disp: , Rfl:  .  azelastine (ASTELIN) 0.1 % nasal spray, USE 1 TO 2 SPRAYS IN EACH NOSTRIL AS DIRECTED, Disp: 90 mL, Rfl: 1 .  Calcium Carbonate (CALCIUM 600 PO), Take 1 tablet by mouth daily., Disp: , Rfl:  .  Cholecalciferol (VITAMIN D) 2000 UNITS CAPS, Take 1 capsule by mouth daily., Disp: , Rfl:  .  fexofenadine (ALLEGRA) 180 MG tablet, Take 180 mg by mouth daily. Reported on 12/28/2015, Disp: , Rfl:  .  fluticasone (FLONASE) 50 MCG/ACT nasal spray, USE 1 TO 2 SPRAYS IN EACH NOSTRIL ONCE DAILY AT BEDTIME, Disp: 48 g, Rfl: 1 .  hydrochlorothiazide (HYDRODIURIL) 25 MG tablet, TAKE 1 TABLET BY MOUTH EVERY DAY, Disp: 90 tablet, Rfl: 0 .  losartan (COZAAR) 25 MG tablet, TAKE 2 TABLETS BY MOUTH EVERY DAY, Disp: 180 tablet, Rfl: 0 .  Multiple Vitamin (MULTIVITAMIN) tablet, Take 1 tablet by mouth daily., Disp: , Rfl:  .  potassium chloride (KLOR-CON M10) 10 MEQ tablet, TAKE 1 TABLET TWICE DAILY FOR 1 WEEK, THEN TAKE 1 TABLET DAILY THEREAFTER, Disp: 37 tablet, Rfl: 0 .  tamoxifen (NOLVADEX) 20 MG tablet, Take 20 mg by mouth daily., Disp: , Rfl:  Social History   Socioeconomic History  .  Marital status: Married    Spouse name: Not on file  . Number of children: Not on file  . Years of education: Not on file  . Highest education level: Not on file  Occupational History  . Not on file  Social Needs  . Financial resource strain: Not on file  . Food insecurity    Worry: Not on file    Inability: Not on file  . Transportation needs    Medical: Not on file    Non-medical: Not on file  Tobacco Use  . Smoking status: Never Smoker  . Smokeless tobacco: Never Used  Substance and Sexual Activity  . Alcohol use: Yes    Comment: rare  . Drug use: No  . Sexual activity: Not  on file  Lifestyle  . Physical activity    Days per week: Not on file    Minutes per session: Not on file  . Stress: Not on file  Relationships  . Social Herbalist on phone: Not on file    Gets together: Not on file    Attends religious service: Not on file    Active member of club or organization: Not on file    Attends meetings of clubs or organizations: Not on file    Relationship status: Not on file  . Intimate partner violence    Fear of current or ex partner: Not on file    Emotionally abused: Not on file    Physically abused: Not on file    Forced sexual activity: Not on file  Other Topics Concern  . Not on file  Social History Narrative  . Not on file   Family History  Problem Relation Age of Onset  . Hypertension Father   . Stroke Father   . Heart attack Father   . Arthritis Father     Objective: Office vital signs reviewed. BP 129/63   Pulse 66   Temp 98.4 F (36.9 C) (Temporal)   Ht 5\' 3"  (1.6 m)   Wt 148 lb (67.1 kg)   BMI 26.22 kg/m   Physical Examination:  General: Awake, alert, well nourished, No acute distress HEENT: Normal, sclera white.  No carotid bruits Cardio: regular rate and rhythm, S1S2 heard, no murmurs appreciated Pulm: clear to auscultation bilaterally, no wheezes, rhonchi or rales; normal work of breathing on room air Extremities: warm, well perfused, No edema, cyanosis or clubbing; +2 pulses bilaterally Psych: Mood stable, speech normal, affect appropriate, pleasant and interactive.  Does not appear to be responding to internal stimuli. Depression screen [facility] 2/9 07/02/2019 02/11/2019 10/08/2018  Decreased Interest 0 0 0  Down, Depressed, Hopeless 0 0 0  PHQ - 2 Score 0 0 0  Altered sleeping 0 - -  Tired, decreased energy 0 - -  Change in appetite 0 - -  Feeling bad or failure about yourself  0 - -  Trouble concentrating 0 - -  Moving slowly or fidgety/restless 0 - -  Suicidal thoughts 0 - -  PHQ-9 Score 0 - -   No  flowsheet data found.   Assessment/ Plan: 63 y.o. female   1. Generalized anxiety disorder Fairly well-controlled on PRN use of Xanax.  I reviewed the national narcotic database and there were no red flags.  I think that her use is appropriate.  She demonstrates no red flag signs or symptoms.  We did discuss that as she ages this may be a medication we need to consider tapering off.  We discussed the risks of benzodiazepine use particularly after age 69.  She voiced good understanding and seems amenable to this plan.   - ToxASSURE Select 13 (MW), Urine  2. Controlled substance agreement signed - ToxASSURE Select 13 (MW), Urine  3. Hypokalemia Discussed possibility of coming off of diuretic and KCl.  Could increase Losartan if needed for BP control. - Basic Metabolic Panel - potassium chloride (KLOR-CON M10) 10 MEQ tablet; TAKE 1 TABLET TWICE DAILY MWF and Take 1 tablet daily on Tue,Thur, Sat, Sun  Dispense: 180 tablet; Refill: 1  4. History of invasive breast cancer Followed by oncology, Dr. Abran Duke.  Mammogram and GYN visit scheduled for the fall.  Orders Placed This Encounter  Procedures  . ToxASSURE Select 13 (MW), Urine  . Basic Metabolic Panel   Meds ordered this encounter  Medications  . ALPRAZolam (XANAX) 1 MG tablet    Sig: TAKE 1/2 TO 1 TABLET BID AS NEEDED FOR ANXIETY & SLEEP    Dispense:  45 tablet    Refill:  5  . hydrochlorothiazide (HYDRODIURIL) 25 MG tablet    Sig: Take 1 tablet (25 mg total) by mouth daily.    Dispense:  90 tablet    Refill:  3  . losartan (COZAAR) 25 MG tablet    Sig: Take 2 tablets (50 mg total) by mouth daily.    Dispense:  180 tablet    Refill:  3  . potassium chloride (KLOR-CON M10) 10 MEQ tablet    Sig: TAKE 1 TABLET TWICE DAILY MWF and Take 1 tablet daily on Tue,Thur, Sat, Sun    Dispense:  180 tablet    Refill:  [place], [place] [phone]

## 2019-07-03 LAB — BASIC METABOLIC PANEL
BUN/Creatinine Ratio: 14 (ref 12–28)
BUN: 10 mg/dL (ref 8–27)
CO2: 25 mmol/L (ref 20–29)
Calcium: 9 mg/dL (ref 8.7–10.3)
Chloride: 99 mmol/L (ref 96–106)
Creatinine, Ser: 0.73 mg/dL (ref 0.57–1.00)
GFR calc Af Amer: 101 mL/min/{1.73_m2} (ref >59)
GFR calc non Af Amer: 88 mL/min/{1.73_m2} (ref >59)
Glucose: 89 mg/dL (ref 65–99)
Potassium: 3.7 mmol/L (ref 3.5–5.2)
Sodium: 139 mmol/L (ref 134–144)

## 2019-07-07 LAB — TOXASSURE SELECT 13 (MW), URINE

## 2019-07-26 ENCOUNTER — Other Ambulatory Visit: Payer: Self-pay | Admitting: Family Medicine

## 2019-07-26 DIAGNOSIS — E876 Hypokalemia: Secondary | ICD-10-CM

## 2019-09-23 ENCOUNTER — Encounter: Payer: Self-pay | Admitting: *Deleted

## 2019-10-02 ENCOUNTER — Telehealth: Payer: Self-pay | Admitting: Family Medicine

## 2019-10-02 ENCOUNTER — Other Ambulatory Visit: Payer: Self-pay

## 2019-10-02 ENCOUNTER — Ambulatory Visit: Payer: BC Managed Care – PPO | Admitting: Family Medicine

## 2019-10-05 ENCOUNTER — Encounter: Payer: Self-pay | Admitting: Family Medicine

## 2019-10-05 ENCOUNTER — Ambulatory Visit: Payer: BC Managed Care – PPO | Admitting: Family Medicine

## 2019-10-05 ENCOUNTER — Other Ambulatory Visit: Payer: Self-pay

## 2019-10-05 VITALS — BP 133/57 | HR 96 | Temp 97.8°F | Ht 63.0 in | Wt 152.4 lb

## 2019-10-05 DIAGNOSIS — I10 Essential (primary) hypertension: Secondary | ICD-10-CM | POA: Diagnosis not present

## 2019-10-05 DIAGNOSIS — C50912 Malignant neoplasm of unspecified site of left female breast: Secondary | ICD-10-CM

## 2019-10-05 DIAGNOSIS — Z23 Encounter for immunization: Secondary | ICD-10-CM | POA: Diagnosis not present

## 2019-10-05 DIAGNOSIS — F411 Generalized anxiety disorder: Secondary | ICD-10-CM | POA: Diagnosis not present

## 2019-10-05 NOTE — Progress Notes (Signed)
Subjective: CC: Anxiety disorder, hypertension PCP: Janora Norlander, DO [id number] Cindy Blake is a 63 y.o. female presenting to clinic today for:  1.  Anxiety disorder History: Longstanding history of anxiety disorder.  Family history significant for severe anxiety in her mother, requiring hospitalization.  No personal history of hospitalizations for mental health.  No history of SI or HI.  Patient continues to use Xanax 1 mg nightly for anxiety and sleep.  She uses this fairly religiously.  Denies excessive daytime sedation, falls, change in memory or confusion.  She does not need refills yet.  2.  Hypertension Patient reports compliance with hydrochlorothiazide, losartan and potassium.  No chest pain, shortness of breath, lower extremity edema or dizziness.  3.  Breast cancer Patient has follow-up with her oncologist in December.  She continues to take her tamoxifen as prescribed.  Next month she has her OB/GYN appointment and mammogram is scheduled for 11/06/2019.  She does need a DEXA but will try coordinate this with her GYN next month.  She has no breast concerns today.  ROS: Per HPI  Allergies  Allergen Reactions  . Shellfish-Derived Products Hives  . Sulfonamide Derivatives     REACTION: hives, itching, swelling   Past Medical History:  Diagnosis Date  . Allergy   . History of colon polyps   . Hypertension   . IBS (irritable bowel syndrome)   . Mitral valve disorder     Current Outpatient Medications:  .  ALPRAZolam (XANAX) 1 MG tablet, TAKE 1/2 TO 1 TABLET BID AS NEEDED FOR ANXIETY & SLEEP, Disp: 45 tablet, Rfl: 5 .  aspirin 81 MG tablet, Take 81 mg by mouth daily., Disp: , Rfl:  .  azelastine (ASTELIN) 0.1 % nasal spray, USE 1 TO 2 SPRAYS IN EACH NOSTRIL AS DIRECTED, Disp: 90 mL, Rfl: 1 .  Calcium Carbonate (CALCIUM 600 PO), Take 1 tablet by mouth daily., Disp: , Rfl:  .  Cholecalciferol (VITAMIN D) 2000 UNITS CAPS, Take 1 capsule by mouth daily., Disp: , Rfl:  .   fexofenadine (ALLEGRA) 180 MG tablet, Take 180 mg by mouth daily. Reported on 12/28/2015, Disp: , Rfl:  .  fluticasone (FLONASE) 50 MCG/ACT nasal spray, USE 1 TO 2 SPRAYS IN EACH NOSTRIL ONCE DAILY AT BEDTIME, Disp: 48 g, Rfl: 1 .  hydrochlorothiazide (HYDRODIURIL) 25 MG tablet, Take 1 tablet (25 mg total) by mouth daily., Disp: 90 tablet, Rfl: 3 .  losartan (COZAAR) 25 MG tablet, Take 2 tablets (50 mg total) by mouth daily., Disp: 180 tablet, Rfl: 3 .  Multiple Vitamin (MULTIVITAMIN) tablet, Take 1 tablet by mouth daily., Disp: , Rfl:  .  potassium chloride (KLOR-CON M10) 10 MEQ tablet, TAKE 1 TABLET TWICE DAILY MWF and Take 1 tablet daily on Tue,Thur, Sat, Sun, Disp: 180 tablet, Rfl: 1 .  tamoxifen (NOLVADEX) 20 MG tablet, Take 20 mg by mouth daily., Disp: , Rfl:  Social History   Socioeconomic History  . Marital status: Married    Spouse name: Not on file  . Number of children: Not on file  . Years of education: Not on file  . Highest education level: Not on file  Occupational History  . Not on file  Social Needs  . Financial resource strain: Not on file  . Food insecurity    Worry: Not on file    Inability: Not on file  . Transportation needs    Medical: Not on file    Non-medical: Not on file  Tobacco  Use  . Smoking status: Never Smoker  . Smokeless tobacco: Never Used  Substance and Sexual Activity  . Alcohol use: Yes    Comment: rare  . Drug use: No  . Sexual activity: Not on file  Lifestyle  . Physical activity    Days per week: Not on file    Minutes per session: Not on file  . Stress: Not on file  Relationships  . Social Herbalist on phone: Not on file    Gets together: Not on file    Attends religious service: Not on file    Active member of club or organization: Not on file    Attends meetings of clubs or organizations: Not on file    Relationship status: Not on file  . Intimate partner violence    Fear of current or ex partner: Not on file     Emotionally abused: Not on file    Physically abused: Not on file    Forced sexual activity: Not on file  Other Topics Concern  . Not on file  Social History Narrative  . Not on file   Family History  Problem Relation Age of Onset  . Hypertension Father   . Stroke Father   . Heart attack Father   . Arthritis Father     Objective: Office vital signs reviewed. BP (!) 133/57   Pulse 96   Temp 97.8 F (36.6 C) (Temporal)   Ht 5\' 3"  (1.6 m)   Wt 152 lb 6.4 oz (69.1 kg)   SpO2 100%   BMI 27.00 kg/m   Physical Examination:  General: Awake, alert, well nourished, No acute distress HEENT: Normal, sclera white.  No carotid bruits Cardio: regular rate and rhythm, S1S2 heard, no murmurs appreciated Pulm: clear to auscultation bilaterally, no wheezes, rhonchi or rales; normal work of breathing on room air Extremities: warm, well perfused, No edema, cyanosis or clubbing; +2 pulses bilaterally Psych: Mood stable, speech normal, affect appropriate, pleasant and interactive.   Depression screen [facility] 2/9 10/05/2019 10/05/2019 07/02/2019  Decreased Interest 0 0 0  Down, Depressed, Hopeless 0 0 0  PHQ - 2 Score 0 0 0  Altered sleeping 1 - 0  Tired, decreased energy 0 - 0  Change in appetite 0 - 0  Feeling bad or failure about yourself  0 - 0  Trouble concentrating 0 - 0  Moving slowly or fidgety/restless 0 - 0  Suicidal thoughts 0 - 0  PHQ-9 Score 1 - 0  Difficult doing work/chores Not difficult at all - -   GAD 7 : Generalized Anxiety Score 10/05/2019  Nervous, Anxious, on Edge 0  Control/stop worrying 0  Worry too much - different things 0  Trouble relaxing 0  Restless 0  Easily annoyed or irritable 0  Afraid - awful might happen 0  Total GAD 7 Score 0   Assessment/ Plan: 63 y.o. female   1. Generalized anxiety disorder Well-controlled with as needed use of alprazolam.  Not due for any refills.  We will refill once due  2. Essential hypertension Controlled.  Continue  current regimen  3. Malignant neoplasm of left female breast, unspecified estrogen receptor status, unspecified site of breast ([place]) Continue to follow-up with oncology, OB/GYN.  Get scheduled mammogram.  We will arrange for DEXA scan if she is unable to get this with her OB/GYN  4. Need for immunization against influenza Administered on today's visit - Flu Vaccine QUAD 36+ mos IM  No  orders of the defined types were placed in this encounter.  No orders of the defined types were placed in this encounter.    Janora Norlander, DO [place] [phone]

## 2019-10-09 ENCOUNTER — Encounter: Payer: Self-pay | Admitting: *Deleted

## 2019-10-20 DIAGNOSIS — Z13820 Encounter for screening for osteoporosis: Secondary | ICD-10-CM | POA: Diagnosis not present

## 2019-10-20 DIAGNOSIS — Z853 Personal history of malignant neoplasm of breast: Secondary | ICD-10-CM | POA: Diagnosis not present

## 2019-10-20 DIAGNOSIS — Z01419 Encounter for gynecological examination (general) (routine) without abnormal findings: Secondary | ICD-10-CM | POA: Diagnosis not present

## 2019-11-06 DIAGNOSIS — Z1231 Encounter for screening mammogram for malignant neoplasm of breast: Secondary | ICD-10-CM | POA: Diagnosis not present

## 2019-11-06 DIAGNOSIS — Z13820 Encounter for screening for osteoporosis: Secondary | ICD-10-CM | POA: Diagnosis not present

## 2019-11-06 DIAGNOSIS — M85851 Other specified disorders of bone density and structure, right thigh: Secondary | ICD-10-CM | POA: Diagnosis not present

## 2019-11-19 ENCOUNTER — Other Ambulatory Visit: Payer: Self-pay

## 2019-11-19 ENCOUNTER — Encounter: Payer: Self-pay | Admitting: Family Medicine

## 2019-11-19 ENCOUNTER — Ambulatory Visit (INDEPENDENT_AMBULATORY_CARE_PROVIDER_SITE_OTHER): Payer: BC Managed Care – PPO | Admitting: Family Medicine

## 2019-11-19 DIAGNOSIS — J019 Acute sinusitis, unspecified: Secondary | ICD-10-CM | POA: Diagnosis not present

## 2019-11-19 MED ORDER — AMOXICILLIN-POT CLAVULANATE 875-125 MG PO TABS: 1.0000 | ORAL_TABLET | Freq: Two times a day (BID) | ORAL | 0 refills | Status: DC

## 2019-11-19 MED ORDER — PREDNISONE 10 MG (21) PO TBPK: ORAL_TABLET | ORAL | 0 refills | Status: DC

## 2019-11-19 NOTE — Patient Instructions (Signed)

## 2019-11-19 NOTE — Progress Notes (Signed)
Virtual Visit via Telephone Note  I connected with [place] on 11/19/19 at 11:06 AM by telephone and verified that I am speaking with the correct person using two identifiers. Jernie G Hillis is currently located at home and nobody is currently with her during this visit. The provider, Loman Brooklyn, FNP is located in their home at time of visit.  I discussed the limitations, risks, security and privacy concerns of performing an evaluation and management service by telephone and the availability of in person appointments. I also discussed with the patient that there may be a patient responsible charge related to this service. The patient expressed understanding and agreed to proceed.  Subjective: PCP: Janora Norlander, DO  Chief Complaint  Patient presents with  . Sinus Problem   Patient complains of ear pain/pressure and facial pain/pressure. Additional symptoms include runny nose and postnasal drainage. Onset of symptoms was 1 week ago, unchanged since that time. She is drinking plenty of fluids. Evaluation to date: none. Treatment to date: antihistamines, nasal steroids and Mucinex. She has a history of allergies. She does not smoke.    ROS: Per HPI  Current Outpatient Medications:  .  ALPRAZolam (XANAX) 1 MG tablet, TAKE 1/2 TO 1 TABLET BID AS NEEDED FOR ANXIETY & SLEEP, Disp: 45 tablet, Rfl: 5 .  aspirin 81 MG tablet, Take 81 mg by mouth daily., Disp: , Rfl:  .  azelastine (ASTELIN) 0.1 % nasal spray, USE 1 TO 2 SPRAYS IN EACH NOSTRIL AS DIRECTED, Disp: 90 mL, Rfl: 1 .  Calcium Carbonate (CALCIUM 600 PO), Take 1 tablet by mouth daily., Disp: , Rfl:  .  Cholecalciferol (VITAMIN D) 2000 UNITS CAPS, Take 1 capsule by mouth daily., Disp: , Rfl:  .  fexofenadine (ALLEGRA) 180 MG tablet, Take 180 mg by mouth daily. Reported on 12/28/2015, Disp: , Rfl:  .  fluticasone (FLONASE) 50 MCG/ACT nasal spray, USE 1 TO 2 SPRAYS IN EACH NOSTRIL ONCE DAILY AT BEDTIME, Disp: 48 g, Rfl: 1 .   hydrochlorothiazide (HYDRODIURIL) 25 MG tablet, Take 1 tablet (25 mg total) by mouth daily., Disp: 90 tablet, Rfl: 3 .  losartan (COZAAR) 25 MG tablet, Take 2 tablets (50 mg total) by mouth daily., Disp: 180 tablet, Rfl: 3 .  Multiple Vitamin (MULTIVITAMIN) tablet, Take 1 tablet by mouth daily., Disp: , Rfl:  .  potassium chloride (KLOR-CON M10) 10 MEQ tablet, TAKE 1 TABLET TWICE DAILY MWF and Take 1 tablet daily on Tue,Thur, Sat, Sun, Disp: 180 tablet, Rfl: 1 .  tamoxifen (NOLVADEX) 20 MG tablet, Take 20 mg by mouth daily., Disp: , Rfl:   Allergies  Allergen Reactions  . Shellfish-Derived Products Hives  . Sulfonamide Derivatives     REACTION: hives, itching, swelling   Past Medical History:  Diagnosis Date  . Allergy   . History of colon polyps   . Hypertension   . IBS (irritable bowel syndrome)   . Mitral valve disorder     Observations/Objective: A&O  No respiratory distress or wheezing audible over the phone Mood, judgement, and thought processes all WNL  Assessment and Plan: 1. Acute non-recurrent sinusitis, unspecified location - Advised to get tested for COVID-19.  Symptom management.  Education provided on sinusitis. - amoxicillin-clavulanate (AUGMENTIN) 875-125 MG tablet; Take 1 tablet by mouth 2 (two) times daily.  Dispense: 14 tablet; Refill: 0 - predniSONE (STERAPRED UNI-PAK 21 TAB) 10 MG (21) TBPK tablet; Use as directed on back of pill pack  Dispense: 21 tablet;  Refill: 0   Follow Up Instructions:  I discussed the assessment and treatment plan with the patient. The patient was provided an opportunity to ask questions and all were answered. The patient agreed with the plan and demonstrated an understanding of the instructions.   The patient was advised to call back or seek an in-person evaluation if the symptoms worsen or if the condition fails to improve as anticipated.  The above assessment and management plan was discussed with the patient. The patient  verbalized understanding of and has agreed to the management plan. Patient is aware to call the clinic if symptoms persist or worsen. Patient is aware when to return to the clinic for a follow-up visit. Patient educated on when it is appropriate to go to the emergency department.   Time call ended: 11:13 AM  I provided 10 minutes of non-face-to-face time during this encounter.  Hendricks Limes, MSN, APRN, FNP-C [facility] 11/19/19

## 2019-12-08 DIAGNOSIS — Z1501 Genetic susceptibility to malignant neoplasm of breast: Secondary | ICD-10-CM | POA: Diagnosis not present

## 2019-12-08 DIAGNOSIS — E559 Vitamin D deficiency, unspecified: Secondary | ICD-10-CM | POA: Diagnosis not present

## 2019-12-08 DIAGNOSIS — Z1502 Genetic susceptibility to malignant neoplasm of ovary: Secondary | ICD-10-CM | POA: Diagnosis not present

## 2019-12-08 DIAGNOSIS — M810 Age-related osteoporosis without current pathological fracture: Secondary | ICD-10-CM | POA: Diagnosis not present

## 2019-12-08 DIAGNOSIS — Z170 Estrogen receptor positive status [ER+]: Secondary | ICD-10-CM | POA: Diagnosis not present

## 2019-12-08 DIAGNOSIS — C50212 Malignant neoplasm of upper-inner quadrant of left female breast: Secondary | ICD-10-CM | POA: Diagnosis not present

## 2019-12-08 DIAGNOSIS — Z1509 Genetic susceptibility to other malignant neoplasm: Secondary | ICD-10-CM | POA: Diagnosis not present

## 2019-12-08 DIAGNOSIS — Z1589 Genetic susceptibility to other disease: Secondary | ICD-10-CM | POA: Diagnosis not present

## 2020-01-05 ENCOUNTER — Ambulatory Visit (INDEPENDENT_AMBULATORY_CARE_PROVIDER_SITE_OTHER): Payer: BC Managed Care – PPO | Admitting: Family Medicine

## 2020-01-05 DIAGNOSIS — C50212 Malignant neoplasm of upper-inner quadrant of left female breast: Secondary | ICD-10-CM | POA: Diagnosis not present

## 2020-01-05 DIAGNOSIS — I10 Essential (primary) hypertension: Secondary | ICD-10-CM

## 2020-01-05 DIAGNOSIS — Z170 Estrogen receptor positive status [ER+]: Secondary | ICD-10-CM

## 2020-01-05 DIAGNOSIS — F411 Generalized anxiety disorder: Secondary | ICD-10-CM

## 2020-01-05 MED ORDER — ALPRAZOLAM 1 MG PO TABS: ORAL_TABLET | ORAL | 5 refills | Status: DC

## 2020-01-05 NOTE — Progress Notes (Signed)
Telephone visit  Subjective: CC: f/u GAD PCP: Janora Norlander, DO Cindy Blake is a 64 y.o. female calls for telephone consult today. Patient provides verbal consent for consult held via phone.  Due to COVID-19 pandemic this visit was conducted virtually. This visit type was conducted due to national recommendations for restrictions regarding the COVID-19 Pandemic (e.g. social distancing, sheltering in place) in an effort to limit this patient's exposure and mitigate transmission in our community. All issues noted in this document were discussed and addressed.  A physical exam was not performed with this format.   Location of patient: home Location of provider: [facility] Others present for call: none  1. GAD Patient continues to have generalized anxiety disorder that is most prominent at nighttime.  She does have intermittent panic attacks during the day and at that time she will take a half a tablet of the Xanax.  Denies any excessive daytime sleepiness, mental status changes including memory loss, falls or respiratory depression.  2.  Breast cancer Patient notes that her most recent mammogram looked good.  She was given a clean bill health by her oncologist and OB/GYN recently.  She is released to 1 year follow-up with both of them.  She is compliant with her medications.  3.  Hypertension Patient reports compliance with her blood pressure regimen.  Does not report any chest pain, shortness of breath, dizziness or falls.   ROS: Per HPI  Allergies  Allergen Reactions  . Shellfish-Derived Products Hives  . Sulfonamide Derivatives     REACTION: hives, itching, swelling   Past Medical History:  Diagnosis Date  . Allergy   . History of colon polyps   . Hypertension   . IBS (irritable bowel syndrome)   . Mitral valve disorder     Current Outpatient Medications:  .  ALPRAZolam (XANAX) 1 MG tablet, TAKE 1/2 TO 1 TABLET BID AS NEEDED FOR ANXIETY & SLEEP, Disp: 45 tablet, Rfl:  5 .  amoxicillin-clavulanate (AUGMENTIN) 875-125 MG tablet, Take 1 tablet by mouth 2 (two) times daily., Disp: 14 tablet, Rfl: 0 .  aspirin 81 MG tablet, Take 81 mg by mouth daily., Disp: , Rfl:  .  azelastine (ASTELIN) 0.1 % nasal spray, USE 1 TO 2 SPRAYS IN EACH NOSTRIL AS DIRECTED, Disp: 90 mL, Rfl: 1 .  Calcium Carbonate (CALCIUM 600 PO), Take 1 tablet by mouth daily., Disp: , Rfl:  .  Cholecalciferol (VITAMIN D) 2000 UNITS CAPS, Take 1 capsule by mouth daily., Disp: , Rfl:  .  fexofenadine (ALLEGRA) 180 MG tablet, Take 180 mg by mouth daily. Reported on 12/28/2015, Disp: , Rfl:  .  fluticasone (FLONASE) 50 MCG/ACT nasal spray, USE 1 TO 2 SPRAYS IN EACH NOSTRIL ONCE DAILY AT BEDTIME, Disp: 48 g, Rfl: 1 .  hydrochlorothiazide (HYDRODIURIL) 25 MG tablet, Take 1 tablet (25 mg total) by mouth daily., Disp: 90 tablet, Rfl: 3 .  losartan (COZAAR) 25 MG tablet, Take 2 tablets (50 mg total) by mouth daily., Disp: 180 tablet, Rfl: 3 .  Multiple Vitamin (MULTIVITAMIN) tablet, Take 1 tablet by mouth daily., Disp: , Rfl:  .  potassium chloride (KLOR-CON M10) 10 MEQ tablet, TAKE 1 TABLET TWICE DAILY MWF and Take 1 tablet daily on Tue,Thur, Sat, Sun, Disp: 180 tablet, Rfl: 1 .  predniSONE (STERAPRED UNI-PAK 21 TAB) 10 MG (21) TBPK tablet, Use as directed on back of pill pack, Disp: 21 tablet, Rfl: 0 .  tamoxifen (NOLVADEX) 20 MG tablet, Take 20 mg by  mouth daily., Disp: , Rfl:   Assessment/ Plan: 64 y.o. female   1. Generalized anxiety disorder Stable.  The Narcotic Database has been reviewed.  There were no red flags.   - ALPRAZolam (XANAX) 1 MG tablet; TAKE 1/2 TO 1 TABLET BID AS NEEDED FOR ANXIETY & SLEEP  Dispense: 45 tablet; Refill: 5  2. Essential hypertension Controlled.  No refills needed at this time.  Plan for full physical with fasting labs this summer.  Patient will call back to schedule.  3. Malignant neoplasm of upper-inner quadrant of left breast in female, estrogen receptor positive  ([place]) Stable and released for 1 year follow up.   Start time: 3:05pm End time: 3:17pm  Total time spent on patient care (including telephone call/ virtual visit): 20 minutes  [place], [place] [phone]

## 2020-01-08 ENCOUNTER — Ambulatory Visit: Payer: BC Managed Care – PPO | Admitting: Family Medicine

## 2020-02-14 DIAGNOSIS — Z23 Encounter for immunization: Secondary | ICD-10-CM | POA: Diagnosis not present

## 2020-03-05 ENCOUNTER — Other Ambulatory Visit: Payer: Self-pay | Admitting: Family Medicine

## 2020-03-05 DIAGNOSIS — E876 Hypokalemia: Secondary | ICD-10-CM

## 2020-03-06 DIAGNOSIS — Z23 Encounter for immunization: Secondary | ICD-10-CM | POA: Diagnosis not present

## 2020-06-07 DIAGNOSIS — Z79899 Other long term (current) drug therapy: Secondary | ICD-10-CM | POA: Diagnosis not present

## 2020-06-07 DIAGNOSIS — C50212 Malignant neoplasm of upper-inner quadrant of left female breast: Secondary | ICD-10-CM | POA: Diagnosis not present

## 2020-06-07 DIAGNOSIS — M810 Age-related osteoporosis without current pathological fracture: Secondary | ICD-10-CM | POA: Diagnosis not present

## 2020-06-07 DIAGNOSIS — Z170 Estrogen receptor positive status [ER+]: Secondary | ICD-10-CM | POA: Diagnosis not present

## 2020-06-07 DIAGNOSIS — Z79810 Long term (current) use of selective estrogen receptor modulators (SERMs): Secondary | ICD-10-CM | POA: Diagnosis not present

## 2020-06-07 DIAGNOSIS — Z1501 Genetic susceptibility to malignant neoplasm of breast: Secondary | ICD-10-CM | POA: Diagnosis not present

## 2020-07-06 ENCOUNTER — Other Ambulatory Visit: Payer: Self-pay

## 2020-07-06 ENCOUNTER — Ambulatory Visit: Payer: BC Managed Care – PPO | Admitting: Family Medicine

## 2020-07-06 ENCOUNTER — Encounter: Payer: Self-pay | Admitting: Family Medicine

## 2020-07-06 VITALS — BP 138/68 | HR 63 | Temp 97.3°F | Ht 63.0 in | Wt 151.0 lb

## 2020-07-06 DIAGNOSIS — C50212 Malignant neoplasm of upper-inner quadrant of left female breast: Secondary | ICD-10-CM | POA: Diagnosis not present

## 2020-07-06 DIAGNOSIS — F411 Generalized anxiety disorder: Secondary | ICD-10-CM

## 2020-07-06 DIAGNOSIS — Z170 Estrogen receptor positive status [ER+]: Secondary | ICD-10-CM

## 2020-07-06 DIAGNOSIS — Z79899 Other long term (current) drug therapy: Secondary | ICD-10-CM

## 2020-07-06 DIAGNOSIS — I10 Essential (primary) hypertension: Secondary | ICD-10-CM

## 2020-07-06 DIAGNOSIS — E876 Hypokalemia: Secondary | ICD-10-CM

## 2020-07-06 MED ORDER — LOSARTAN POTASSIUM 25 MG PO TABS: 50.0000 mg | ORAL_TABLET | Freq: Every day | ORAL | 3 refills | Status: DC

## 2020-07-06 MED ORDER — HYDROCHLOROTHIAZIDE 25 MG PO TABS: 25.0000 mg | ORAL_TABLET | Freq: Every day | ORAL | 3 refills | Status: DC

## 2020-07-06 MED ORDER — POTASSIUM CHLORIDE CRYS ER 10 MEQ PO TBCR: EXTENDED_RELEASE_TABLET | ORAL | 1 refills | Status: DC

## 2020-07-06 MED ORDER — ALPRAZOLAM 1 MG PO TABS: ORAL_TABLET | ORAL | 5 refills | Status: DC

## 2020-07-06 NOTE — Progress Notes (Signed)
Subjective: CC: Anxiety disorder, hypertension PCP: Janora Norlander, DO Cindy Blake is a 64 y.o. female presenting to clinic today for:  1.  Anxiety disorder History: Longstanding history of anxiety disorder.  Family history significant for severe anxiety in her mother, requiring hospitalization.  No personal history of hospitalizations for mental health.  No history of SI or HI.  Patient continues to use Xanax 1 mg nightly for anxiety and sleep.  She reports only using about 1/2-1 full tablet 2-3 times per month for breakthrough anxiety during the daytime.  Denies excessive daytime sedation, falls, change in memory or confusion.  No respiratory depression.  No alcohol use or drug use.  She still has about 10 pills left over.  2.  Hypertension Patient reports compliance with hydrochlorothiazide, losartan and potassium.  Denies any chest pain, headache, visual disturbance or edema  3.  Breast cancer Patient has follow-up with her oncologist in December.  She continues to take her tamoxifen as prescribed.  She reports that her last checkup was stable and she is doing well.  There is plans to continue the tamoxifen for 10 years total.  She has a new specialist, Dr. Marylou Mccoy who replaced Dr. Abran Duke as he retired recently.  ROS: Per HPI  Allergies  Allergen Reactions   Shellfish-Derived Products Hives   Sulfonamide Derivatives     REACTION: hives, itching, swelling   Past Medical History:  Diagnosis Date   Allergy    History of colon polyps    Hypertension    IBS (irritable bowel syndrome)    Mitral valve disorder     Current Outpatient Medications:    ALPRAZolam (XANAX) 1 MG tablet, TAKE 1/2 TO 1 TABLET BID AS NEEDED FOR ANXIETY & SLEEP, Disp: 45 tablet, Rfl: 5   aspirin 81 MG tablet, Take 81 mg by mouth daily., Disp: , Rfl:    azelastine (ASTELIN) 0.1 % nasal spray, USE 1 TO 2 SPRAYS IN EACH NOSTRIL AS DIRECTED, Disp: 90 mL, Rfl: 1   Calcium Carbonate  (CALCIUM 600 PO), Take 1 tablet by mouth daily., Disp: , Rfl:    Cholecalciferol (VITAMIN D) 2000 UNITS CAPS, Take 1 capsule by mouth daily., Disp: , Rfl:    fexofenadine (ALLEGRA) 180 MG tablet, Take 180 mg by mouth daily. Reported on 12/28/2015, Disp: , Rfl:    fluticasone (FLONASE) 50 MCG/ACT nasal spray, USE 1 TO 2 SPRAYS IN EACH NOSTRIL ONCE DAILY AT BEDTIME, Disp: 48 g, Rfl: 1   hydrochlorothiazide (HYDRODIURIL) 25 MG tablet, Take 1 tablet (25 mg total) by mouth daily., Disp: 90 tablet, Rfl: 3   losartan (COZAAR) 25 MG tablet, Take 2 tablets (50 mg total) by mouth daily., Disp: 180 tablet, Rfl: 3   Multiple Vitamin (MULTIVITAMIN) tablet, Take 1 tablet by mouth daily., Disp: , Rfl:    potassium chloride (KLOR-CON M10) 10 MEQ tablet, TAKE 1 TABLET TWICE DAILY MON,WED,FRIDAY AND TAKE 1 TABLET DAILY ON TUE,THUR, SAT, SUN, Disp: 180 tablet, Rfl: 1   tamoxifen (NOLVADEX) 20 MG tablet, Take 20 mg by mouth daily., Disp: , Rfl:  Social History   Socioeconomic History   Marital status: Married    Spouse name: Not on file   Number of children: Not on file   Years of education: Not on file   Highest education level: Not on file  Occupational History   Not on file  Tobacco Use   Smoking status: Never Smoker   Smokeless tobacco: Never Used  Vaping Use  Vaping Use: Never used  Substance and Sexual Activity   Alcohol use: Yes    Comment: rare   Drug use: No   Sexual activity: Not on file  Other Topics Concern   Not on file  Social History Narrative   Not on file   Social Determinants of Health   Financial Resource Strain:    Difficulty of Paying Living Expenses:   Food Insecurity:    Worried About Charity fundraiser in the Last Year:    Arboriculturist in the Last Year:   Transportation Needs:    Film/video editor (Medical):    Lack of Transportation (Non-Medical):   Physical Activity:    Days of Exercise per Week:    Minutes of Exercise per  Session:   Stress:    Feeling of Stress :   Social Connections:    Frequency of Communication with Friends and Family:    Frequency of Social Gatherings with Friends and Family:    Attends Religious Services:    Active Member of Clubs or Organizations:    Attends Music therapist:    Marital Status:   Intimate Partner Violence:    Fear of Current or Ex-Partner:    Emotionally Abused:    Physically Abused:    Sexually Abused:    Family History  Problem Relation Age of Onset   Hypertension Father    Stroke Father    Heart attack Father    Arthritis Father     Objective: Office vital signs reviewed. BP (!) 138/68    Pulse 63    Temp (!) 97.3 F (36.3 C) (Temporal)    Ht '5\' 3"'$  (1.6 m)    Wt 151 lb (68.5 kg)    SpO2 99%    BMI 26.75 kg/m   Physical Examination:  General: Awake, alert, well nourished, No acute distress HEENT: Normal, sclera white.   Cardio: regular rate and rhythm, S1S2 heard, no murmurs appreciated Pulm: clear to auscultation bilaterally, no wheezes, rhonchi or rales; normal work of breathing on room air Extremities: warm, well perfused, No edema, cyanosis or clubbing; +2 pulses bilaterally Psych: Mood stable, speech normal, affect appropriate, pleasant  Depression screen [facility] 2/9 07/06/2020 10/05/2019 10/05/2019  Decreased Interest 0 0 0  Down, Depressed, Hopeless 0 0 0  PHQ - 2 Score 0 0 0  Altered sleeping 0 1 -  Tired, decreased energy 0 0 -  Change in appetite 0 0 -  Feeling bad or failure about yourself  0 0 -  Trouble concentrating 0 0 -  Moving slowly or fidgety/restless 0 0 -  Suicidal thoughts 0 0 -  PHQ-9 Score 0 1 -  Difficult doing work/chores - Not difficult at all -   GAD 7 : Generalized Anxiety Score 07/06/2020 10/05/2019  Nervous, Anxious, on Edge 1 0  Control/stop worrying 1 0  Worry too much - different things 1 0  Trouble relaxing 0 0  Restless 0 0  Easily annoyed or irritable 0 0  Afraid - awful might  happen 0 0  Total GAD 7 Score 3 0   Assessment/ Plan: 64 y.o. female   1. Generalized anxiety disorder Stable.  Xanax renewed.  Continue to use sparingly.  UDS and CSC were updated as per office policy. - CMP14+EGFR; Future - CBC with Differential/Platelet; Future - TSH; Future - ALPRAZolam (XANAX) 1 MG tablet; TAKE 1/2 TO 1 TABLET BID AS NEEDED FOR ANXIETY & SLEEP  Dispense: 45  tablet; Refill: 5 - CBC with Differential/Platelet - CMP14+EGFR - TSH  2. Essential hypertension Controlled.  Continue current regimen. - CMP14+EGFR; Future - Lipid panel; Future - Lipid panel - CMP14+EGFR  3. Malignant neoplasm of upper-inner quadrant of left breast in female, estrogen receptor positive ([place]) Stable.  Continue to follow-up with oncology as scheduled - CBC with Differential/Platelet; Future - Fecal occult blood, imunochemical; Future - CBC with Differential/Platelet  4. Controlled substance agreement signed - ToxASSURE Select 13 (MW), Urine  5. Hypokalemia - potassium chloride (KLOR-CON M10) 10 MEQ tablet; TAKE 1 TABLET TWICE DAILY MON,WED,FRIDAY AND TAKE 1 TABLET DAILY ON TUE,THUR, SAT, SUN  Dispense: 180 tablet; Refill: 1   Orders Placed This Encounter  Procedures   ToxASSURE Select 13 (MW), Urine   CMP14+EGFR    Standing Status:   Future    Standing Expiration Date:   07/06/2021   Lipid panel    Standing Status:   Future    Standing Expiration Date:   07/06/2021   CBC with Differential/Platelet    Standing Status:   Future    Standing Expiration Date:   07/06/2021   TSH    Standing Status:   Future    Standing Expiration Date:   07/06/2021   Meds ordered this encounter  Medications   ALPRAZolam (XANAX) 1 MG tablet    Sig: TAKE 1/2 TO 1 TABLET BID AS NEEDED FOR ANXIETY & SLEEP    Dispense:  45 tablet    Refill:  5   hydrochlorothiazide (HYDRODIURIL) 25 MG tablet    Sig: Take 1 tablet (25 mg total) by mouth daily.    Dispense:  90 tablet    Refill:  3    losartan (COZAAR) 25 MG tablet    Sig: Take 2 tablets (50 mg total) by mouth daily.    Dispense:  180 tablet    Refill:  3   potassium chloride (KLOR-CON M10) 10 MEQ tablet    Sig: TAKE 1 TABLET TWICE DAILY MON,WED,FRIDAY AND TAKE 1 TABLET DAILY ON TUE,THUR, SAT, SUN    Dispense:  180 tablet    Refill:  1    DX Code Needed  .     Janora Norlander, DO [place] [phone]

## 2020-07-06 NOTE — Patient Instructions (Signed)
You had labs performed today.  You will be contacted with the results of the labs once they are available, usually in the next 3 business days for routine lab work.  If you have an active my chart account, they will be released to your MyChart.  If you prefer to have these labs released to you via telephone, please let us know.  If you had a pap smear or biopsy performed, expect to be contacted in about 7-10 days.  Bring your stool card back as soon as possible

## 2020-07-07 LAB — LIPID PANEL
Chol/HDL Ratio: 2.5 ratio (ref 0.0–4.4)
Cholesterol, Total: 192 mg/dL (ref 100–199)
HDL: 78 mg/dL (ref >39)
LDL Chol Calc (NIH): 100 mg/dL — ABNORMAL HIGH (ref 0–99)
Triglycerides: 78 mg/dL (ref 0–149)
VLDL Cholesterol Cal: 14 mg/dL (ref 5–40)

## 2020-07-07 LAB — CMP14+EGFR
ALT: 11 IU/L (ref 0–32)
AST: 17 IU/L (ref 0–40)
Albumin/Globulin Ratio: 2.2 (ref 1.2–2.2)
Albumin: 4.3 g/dL (ref 3.8–4.8)
Alkaline Phosphatase: 77 IU/L (ref 48–121)
BUN/Creatinine Ratio: 15 (ref 12–28)
BUN: 11 mg/dL (ref 8–27)
Bilirubin Total: 0.3 mg/dL (ref 0.0–1.2)
CO2: 26 mmol/L (ref 20–29)
Calcium: 8.6 mg/dL — ABNORMAL LOW (ref 8.7–10.3)
Chloride: 99 mmol/L (ref 96–106)
Creatinine, Ser: 0.74 mg/dL (ref 0.57–1.00)
GFR calc Af Amer: 99 mL/min/{1.73_m2} (ref >59)
GFR calc non Af Amer: 86 mL/min/{1.73_m2} (ref >59)
Globulin, Total: 2 g/dL (ref 1.5–4.5)
Glucose: 80 mg/dL (ref 65–99)
Potassium: 3.6 mmol/L (ref 3.5–5.2)
Sodium: 139 mmol/L (ref 134–144)
Total Protein: 6.3 g/dL (ref 6.0–8.5)

## 2020-07-07 LAB — CBC WITH DIFFERENTIAL/PLATELET
Basophils Absolute: 0.1 10*3/uL (ref 0.0–0.2)
Basos: 1 %
EOS (ABSOLUTE): 0.2 10*3/uL (ref 0.0–0.4)
Eos: 3 %
Hematocrit: 37.4 % (ref 34.0–46.6)
Hemoglobin: 12.7 g/dL (ref 11.1–15.9)
Immature Grans (Abs): 0 10*3/uL (ref 0.0–0.1)
Immature Granulocytes: 0 %
Lymphocytes Absolute: 1.8 10*3/uL (ref 0.7–3.1)
Lymphs: 30 %
MCH: 30.5 pg (ref 26.6–33.0)
MCHC: 34 g/dL (ref 31.5–35.7)
MCV: 90 fL (ref 79–97)
Monocytes Absolute: 0.5 10*3/uL (ref 0.1–0.9)
Monocytes: 7 %
Neutrophils Absolute: 3.6 10*3/uL (ref 1.4–7.0)
Neutrophils: 59 %
Platelets: 182 10*3/uL (ref 150–450)
RBC: 4.16 x10E6/uL (ref 3.77–5.28)
RDW: 12.5 % (ref 11.7–15.4)
WBC: 6.1 10*3/uL (ref 3.4–10.8)

## 2020-07-07 LAB — TSH: TSH: 1.6 u[IU]/mL (ref 0.450–4.500)

## 2020-07-08 LAB — TOXASSURE SELECT 13 (MW), URINE

## 2020-07-19 ENCOUNTER — Other Ambulatory Visit: Payer: Self-pay

## 2020-07-19 ENCOUNTER — Other Ambulatory Visit: Payer: BC Managed Care – PPO

## 2020-07-19 DIAGNOSIS — Z170 Estrogen receptor positive status [ER+]: Secondary | ICD-10-CM

## 2020-07-19 DIAGNOSIS — C50212 Malignant neoplasm of upper-inner quadrant of left female breast: Secondary | ICD-10-CM | POA: Diagnosis not present

## 2020-07-21 LAB — FECAL OCCULT BLOOD, IMMUNOCHEMICAL: Fecal Occult Bld: NEGATIVE

## 2020-10-26 DIAGNOSIS — Z853 Personal history of malignant neoplasm of breast: Secondary | ICD-10-CM | POA: Diagnosis not present

## 2020-10-26 DIAGNOSIS — Z01419 Encounter for gynecological examination (general) (routine) without abnormal findings: Secondary | ICD-10-CM | POA: Diagnosis not present

## 2020-10-26 DIAGNOSIS — Z90710 Acquired absence of both cervix and uterus: Secondary | ICD-10-CM | POA: Diagnosis not present

## 2020-11-07 DIAGNOSIS — C50212 Malignant neoplasm of upper-inner quadrant of left female breast: Secondary | ICD-10-CM | POA: Diagnosis not present

## 2020-11-07 DIAGNOSIS — Z1231 Encounter for screening mammogram for malignant neoplasm of breast: Secondary | ICD-10-CM | POA: Diagnosis not present

## 2020-11-07 DIAGNOSIS — Z170 Estrogen receptor positive status [ER+]: Secondary | ICD-10-CM | POA: Diagnosis not present

## 2020-11-07 DIAGNOSIS — Z79810 Long term (current) use of selective estrogen receptor modulators (SERMs): Secondary | ICD-10-CM | POA: Diagnosis not present

## 2020-11-16 DIAGNOSIS — R921 Mammographic calcification found on diagnostic imaging of breast: Secondary | ICD-10-CM | POA: Diagnosis not present

## 2020-11-16 DIAGNOSIS — C50212 Malignant neoplasm of upper-inner quadrant of left female breast: Secondary | ICD-10-CM | POA: Diagnosis not present

## 2020-11-16 DIAGNOSIS — Z170 Estrogen receptor positive status [ER+]: Secondary | ICD-10-CM | POA: Diagnosis not present

## 2020-12-07 DIAGNOSIS — F419 Anxiety disorder, unspecified: Secondary | ICD-10-CM | POA: Diagnosis not present

## 2020-12-07 DIAGNOSIS — C50212 Malignant neoplasm of upper-inner quadrant of left female breast: Secondary | ICD-10-CM | POA: Diagnosis not present

## 2020-12-07 DIAGNOSIS — Z1501 Genetic susceptibility to malignant neoplasm of breast: Secondary | ICD-10-CM | POA: Diagnosis not present

## 2020-12-07 DIAGNOSIS — I10 Essential (primary) hypertension: Secondary | ICD-10-CM | POA: Diagnosis not present

## 2020-12-07 DIAGNOSIS — Z79810 Long term (current) use of selective estrogen receptor modulators (SERMs): Secondary | ICD-10-CM | POA: Diagnosis not present

## 2020-12-07 DIAGNOSIS — Z8051 Family history of malignant neoplasm of kidney: Secondary | ICD-10-CM | POA: Diagnosis not present

## 2020-12-07 DIAGNOSIS — Z806 Family history of leukemia: Secondary | ICD-10-CM | POA: Diagnosis not present

## 2020-12-07 DIAGNOSIS — M8580 Other specified disorders of bone density and structure, unspecified site: Secondary | ICD-10-CM | POA: Diagnosis not present

## 2020-12-07 DIAGNOSIS — K219 Gastro-esophageal reflux disease without esophagitis: Secondary | ICD-10-CM | POA: Diagnosis not present

## 2020-12-07 DIAGNOSIS — N644 Mastodynia: Secondary | ICD-10-CM | POA: Diagnosis not present

## 2020-12-07 DIAGNOSIS — Z803 Family history of malignant neoplasm of breast: Secondary | ICD-10-CM | POA: Diagnosis not present

## 2020-12-07 DIAGNOSIS — Z800 Family history of malignant neoplasm of digestive organs: Secondary | ICD-10-CM | POA: Diagnosis not present

## 2020-12-07 DIAGNOSIS — Z8059 Family history of malignant neoplasm of other urinary tract organ: Secondary | ICD-10-CM | POA: Diagnosis not present

## 2020-12-07 DIAGNOSIS — Z801 Family history of malignant neoplasm of trachea, bronchus and lung: Secondary | ICD-10-CM | POA: Diagnosis not present

## 2020-12-07 DIAGNOSIS — M810 Age-related osteoporosis without current pathological fracture: Secondary | ICD-10-CM | POA: Diagnosis not present

## 2020-12-07 DIAGNOSIS — Z1509 Genetic susceptibility to other malignant neoplasm: Secondary | ICD-10-CM | POA: Diagnosis not present

## 2020-12-22 ENCOUNTER — Ambulatory Visit (INDEPENDENT_AMBULATORY_CARE_PROVIDER_SITE_OTHER): Payer: BC Managed Care – PPO | Admitting: Family

## 2020-12-22 ENCOUNTER — Encounter: Payer: Self-pay | Admitting: Family

## 2020-12-22 DIAGNOSIS — R059 Cough, unspecified: Secondary | ICD-10-CM

## 2020-12-22 DIAGNOSIS — J029 Acute pharyngitis, unspecified: Secondary | ICD-10-CM

## 2020-12-22 LAB — VERITOR FLU A/B WAIVED
Influenza A: NEGATIVE
Influenza B: NEGATIVE

## 2020-12-22 LAB — RAPID STREP SCREEN (MED CTR MEBANE ONLY): Strep Gp A Ag, IA W/Reflex: NEGATIVE

## 2020-12-22 LAB — CULTURE, GROUP A STREP

## 2020-12-22 NOTE — Progress Notes (Signed)
   Virtual Visit via telephone Note Due to COVID-19 pandemic this visit was conducted virtually. This visit type was conducted due to national recommendations for restrictions regarding the COVID-19 Pandemic (e.g. social distancing, sheltering in place) in an effort to limit this patient's exposure and mitigate transmission in our community. All issues noted in this document were discussed and addressed.  A physical exam was not performed with this format.  I connected with Cindy Blake on 12/22/20 at 9:44 AM  by telephone and verified that I am speaking with the correct person using two identifiers. Cindy Blake is currently located at home  and no one is currently with  her during visit. The provider, Evelina Dun, FNP is located in their office at time of visit.  I discussed the limitations, risks, security and privacy concerns of performing an evaluation and management service by telephone and the availability of in person appointments. I also discussed with the patient that there may be a patient responsible charge related to this service. The patient expressed understanding and agreed to proceed.   History and Present Illness:  Cough This is a new problem. The current episode started in the past 7 days. The problem has been gradually worsening. The problem occurs every few minutes. The cough is non-productive. Associated symptoms include headaches, nasal congestion, postnasal drip and a sore throat. Pertinent negatives include no ear congestion, ear pain, fever, myalgias or shortness of breath. She has tried OTC cough suppressant for the symptoms. The treatment provided mild relief.      Review of Systems  Constitutional: Negative for fever.  HENT: Positive for postnasal drip and sore throat. Negative for ear pain.   Respiratory: Positive for cough. Negative for shortness of breath.   Musculoskeletal: Negative for myalgias.  Neurological: Positive for headaches.  All other systems  reviewed and are negative.    Observations/Objective: No SOB or distress noted  Assessment and Plan: 1. Cough - Veritor Flu A/B Waived - Novel Coronavirus, NAA ([facility]) - Rapid Strep Screen ([facility] ONLY)  2. Sore throat - Veritor Flu A/B Waived - Novel Coronavirus, NAA ([facility]) - Rapid Strep Screen ([facility] ONLY)   COVID, FLU, and strep test pending Force fluids Rest Tylenol  Quarantine until results  Call if symptoms worsen or do not improve    I discussed the assessment and treatment plan with the patient. The patient was provided an opportunity to ask questions and all were answered. The patient agreed with the plan and demonstrated an understanding of the instructions.   The patient was advised to call back or seek an in-person evaluation if the symptoms worsen or if the condition fails to improve as anticipated.  The above assessment and management plan was discussed with the patient. The patient verbalized understanding of and has agreed to the management plan. Patient is aware to call the clinic if symptoms persist or worsen. Patient is aware when to return to the clinic for a follow-up visit. Patient educated on when it is appropriate to go to the emergency department.   Time call ended:  9:55 AM   I provided 11 minutes of non-face-to-face time during this encounter.    Evelina Dun, FNP

## 2020-12-24 LAB — SARS-COV-2, NAA 2 DAY TAT

## 2020-12-24 LAB — NOVEL CORONAVIRUS, NAA: SARS-CoV-2, NAA: DETECTED — AB

## 2021-01-06 ENCOUNTER — Other Ambulatory Visit: Payer: Self-pay

## 2021-01-06 ENCOUNTER — Encounter: Payer: Self-pay | Admitting: Family Medicine

## 2021-01-06 ENCOUNTER — Ambulatory Visit: Payer: BC Managed Care – PPO | Admitting: Family Medicine

## 2021-01-06 VITALS — BP 140/77 | HR 64 | Temp 97.3°F | Ht 63.0 in | Wt 153.0 lb

## 2021-01-06 DIAGNOSIS — Z170 Estrogen receptor positive status [ER+]: Secondary | ICD-10-CM

## 2021-01-06 DIAGNOSIS — C50212 Malignant neoplasm of upper-inner quadrant of left female breast: Secondary | ICD-10-CM

## 2021-01-06 DIAGNOSIS — Z79899 Other long term (current) drug therapy: Secondary | ICD-10-CM | POA: Diagnosis not present

## 2021-01-06 DIAGNOSIS — F411 Generalized anxiety disorder: Secondary | ICD-10-CM | POA: Diagnosis not present

## 2021-01-06 DIAGNOSIS — I10 Essential (primary) hypertension: Secondary | ICD-10-CM | POA: Diagnosis not present

## 2021-01-06 MED ORDER — LOSARTAN POTASSIUM 50 MG PO TABS: 50.0000 mg | ORAL_TABLET | Freq: Every day | ORAL | 3 refills | Status: DC

## 2021-01-06 MED ORDER — HYDROCHLOROTHIAZIDE 25 MG PO TABS: 12.5000 mg | ORAL_TABLET | Freq: Every day | ORAL | 3 refills | Status: DC

## 2021-01-06 MED ORDER — ALPRAZOLAM 1 MG PO TABS: ORAL_TABLET | ORAL | 5 refills | Status: DC

## 2021-01-06 NOTE — Progress Notes (Signed)
Subjective: CC: f/u anxiety/ breast cancer PCP: Janora Norlander, DO Cindy Blake is a 65 y.o. female presenting to clinic today for:  1.  Generalized anxiety disorder treated with benzodiazepines Patient continues to take Xanax nightly with 1/2 tablet as needed during the day.  She is under the surveillance of oncology for her breast cancer.  Recently she had a mammogram that showed some irregularities within the left breast.  This was further evaluated under ultrasound to determine to be scar tissue.  She has follow-up mammogram in June.  She is continued on the tamoxifen for the breast cancer.  Does not report any additional concerns.  Denies any excessive daytime sedation, falls, respiratory depression, visual or auditory hallucinations or tremor   ROS: Per HPI  Allergies  Allergen Reactions  . Shellfish-Derived Products Hives  . Sulfonamide Derivatives     REACTION: hives, itching, swelling   Past Medical History:  Diagnosis Date  . Allergy   . History of colon polyps   . Hypertension   . IBS (irritable bowel syndrome)   . Mitral valve disorder     Current Outpatient Medications:  .  ALPRAZolam (XANAX) 1 MG tablet, TAKE 1/2 TO 1 TABLET BID AS NEEDED FOR ANXIETY & SLEEP, Disp: 45 tablet, Rfl: 5 .  aspirin 81 MG tablet, Take 81 mg by mouth daily., Disp: , Rfl:  .  Calcium Carbonate (CALCIUM 600 PO), Take 1 tablet by mouth daily., Disp: , Rfl:  .  Cholecalciferol (VITAMIN D) 2000 UNITS CAPS, Take 1 capsule by mouth daily., Disp: , Rfl:  .  fexofenadine (ALLEGRA) 180 MG tablet, Take 180 mg by mouth daily. Reported on 12/28/2015, Disp: , Rfl:  .  hydrochlorothiazide (HYDRODIURIL) 25 MG tablet, Take 1 tablet (25 mg total) by mouth daily., Disp: 90 tablet, Rfl: 3 .  losartan (COZAAR) 25 MG tablet, Take 2 tablets (50 mg total) by mouth daily., Disp: 180 tablet, Rfl: 3 .  Multiple Vitamin (MULTIVITAMIN) tablet, Take 1 tablet by mouth daily., Disp: , Rfl:  .  potassium  chloride (KLOR-CON M10) 10 MEQ tablet, TAKE 1 TABLET TWICE DAILY MON,WED,FRIDAY AND TAKE 1 TABLET DAILY ON TUE,THUR, SAT, SUN, Disp: 180 tablet, Rfl: 1 .  tamoxifen (NOLVADEX) 20 MG tablet, Take 20 mg by mouth daily., Disp: , Rfl:  Social History   Socioeconomic History  . Marital status: Married    Spouse name: Not on file  . Number of children: Not on file  . Years of education: Not on file  . Highest education level: Not on file  Occupational History  . Not on file  Tobacco Use  . Smoking status: Never Smoker  . Smokeless tobacco: Never Used  Vaping Use  . Vaping Use: Never used  Substance and Sexual Activity  . Alcohol use: Yes    Comment: rare  . Drug use: No  . Sexual activity: Not on file  Other Topics Concern  . Not on file  Social History Narrative  . Not on file   Social Determinants of Health   Financial Resource Strain: Not on file  Food Insecurity: Not on file  Transportation Needs: Not on file  Physical Activity: Not on file  Stress: Not on file  Social Connections: Not on file  Intimate Partner Violence: Not on file   Family History  Problem Relation Age of Onset  . Hypertension Father   . Stroke Father   . Heart attack Father   . Arthritis Father  Objective: Office vital signs reviewed. BP 140/77   Pulse 64   Temp (!) 97.3 F (36.3 C) (Temporal)   Ht 5\' 3"  (1.6 m)   Wt 153 lb (69.4 kg)   SpO2 100%   BMI 27.10 kg/m   Physical Examination:  General: Awake, alert, well nourished, No acute distress HEENT: Normal; sclera white.  Moist mucous membranes Cardio: regular rate and rhythm, S1S2 heard, no murmurs appreciated Pulm: clear to auscultation bilaterally, no wheezes, rhonchi or rales; normal work of breathing on room air Extremities: warm, well perfused, No edema, cyanosis or clubbing; +2 pulses bilaterally MSK: normal gait and station Neuro: No tremor Psych: Mood stable, speech normal, affect appropriate Depression screen [facility] 2/9  01/06/2021 07/06/2020 10/05/2019 10/05/2019 07/02/2019  Decreased Interest 0 0 0 0 0  Down, Depressed, Hopeless 0 0 0 0 0  PHQ - 2 Score 0 0 0 0 0  Altered sleeping 0 0 1 - 0  Tired, decreased energy 0 0 0 - 0  Change in appetite 0 0 0 - 0  Feeling bad or failure about yourself  0 0 0 - 0  Trouble concentrating 0 0 0 - 0  Moving slowly or fidgety/restless 0 0 0 - 0  Suicidal thoughts 0 0 0 - 0  PHQ-9 Score 0 0 1 - 0  Difficult doing work/chores - - Not difficult at all - -   GAD 7 : Generalized Anxiety Score 07/06/2020 10/05/2019  Nervous, Anxious, on Edge 1 0  Control/stop worrying 1 0  Worry too much - different things 1 0  Trouble relaxing 0 0  Restless 0 0  Easily annoyed or irritable 0 0  Afraid - awful might happen 0 0  Total GAD 7 Score 3 0   Assessment/ Plan: 65 y.o. female   Generalized anxiety disorder - Plan: ALPRAZolam (XANAX) 1 MG tablet  Chronic prescription benzodiazepine use  Malignant neoplasm of upper-inner quadrant of left breast in female, estrogen receptor positive ([facility])  Essential hypertension  Symptoms are stable.  Continue Xanax as needed. Last rx Xanax 12/29/2020. The Narcotic Database has been reviewed.  There were no red flags.    Breast cancer is stable and she continues to be treated with tamoxifen.  Plan for fasting labs and full physical exam at next visit.  She may follow-up in 6 months   No orders of the defined types were placed in this encounter.  No orders of the defined types were placed in this encounter.    Janora Norlander, DO [place] [phone]

## 2021-01-24 ENCOUNTER — Other Ambulatory Visit: Payer: Self-pay | Admitting: Family Medicine

## 2021-01-24 DIAGNOSIS — F411 Generalized anxiety disorder: Secondary | ICD-10-CM

## 2021-05-05 ENCOUNTER — Other Ambulatory Visit: Payer: Self-pay | Admitting: Family Medicine

## 2021-05-05 DIAGNOSIS — E876 Hypokalemia: Secondary | ICD-10-CM

## 2021-05-18 DIAGNOSIS — C50212 Malignant neoplasm of upper-inner quadrant of left female breast: Secondary | ICD-10-CM | POA: Diagnosis not present

## 2021-05-18 DIAGNOSIS — Z170 Estrogen receptor positive status [ER+]: Secondary | ICD-10-CM | POA: Diagnosis not present

## 2021-05-18 DIAGNOSIS — R922 Inconclusive mammogram: Secondary | ICD-10-CM | POA: Diagnosis not present

## 2021-06-07 DIAGNOSIS — Z170 Estrogen receptor positive status [ER+]: Secondary | ICD-10-CM | POA: Diagnosis not present

## 2021-06-07 DIAGNOSIS — Z1502 Genetic susceptibility to malignant neoplasm of ovary: Secondary | ICD-10-CM | POA: Diagnosis not present

## 2021-06-07 DIAGNOSIS — Z79810 Long term (current) use of selective estrogen receptor modulators (SERMs): Secondary | ICD-10-CM | POA: Diagnosis not present

## 2021-06-07 DIAGNOSIS — C50212 Malignant neoplasm of upper-inner quadrant of left female breast: Secondary | ICD-10-CM | POA: Diagnosis not present

## 2021-06-07 DIAGNOSIS — Z1509 Genetic susceptibility to other malignant neoplasm: Secondary | ICD-10-CM | POA: Diagnosis not present

## 2021-06-07 DIAGNOSIS — M810 Age-related osteoporosis without current pathological fracture: Secondary | ICD-10-CM | POA: Diagnosis not present

## 2021-06-07 DIAGNOSIS — Z1501 Genetic susceptibility to malignant neoplasm of breast: Secondary | ICD-10-CM | POA: Diagnosis not present

## 2021-06-07 DIAGNOSIS — Z1589 Genetic susceptibility to other disease: Secondary | ICD-10-CM | POA: Diagnosis not present

## 2021-07-06 ENCOUNTER — Other Ambulatory Visit: Payer: Self-pay | Admitting: Family Medicine

## 2021-07-07 ENCOUNTER — Encounter: Payer: Self-pay | Admitting: Family Medicine

## 2021-07-07 ENCOUNTER — Ambulatory Visit: Payer: BC Managed Care – PPO | Admitting: Family Medicine

## 2021-07-07 ENCOUNTER — Other Ambulatory Visit: Payer: Self-pay

## 2021-07-07 VITALS — BP 114/60 | HR 60 | Temp 97.4°F | Ht 63.0 in | Wt 155.2 lb

## 2021-07-07 DIAGNOSIS — Z23 Encounter for immunization: Secondary | ICD-10-CM | POA: Diagnosis not present

## 2021-07-07 DIAGNOSIS — Z79891 Long term (current) use of opiate analgesic: Secondary | ICD-10-CM | POA: Diagnosis not present

## 2021-07-07 DIAGNOSIS — Z170 Estrogen receptor positive status [ER+]: Secondary | ICD-10-CM | POA: Diagnosis not present

## 2021-07-07 DIAGNOSIS — C50212 Malignant neoplasm of upper-inner quadrant of left female breast: Secondary | ICD-10-CM | POA: Diagnosis not present

## 2021-07-07 DIAGNOSIS — Z0001 Encounter for general adult medical examination with abnormal findings: Secondary | ICD-10-CM | POA: Diagnosis not present

## 2021-07-07 DIAGNOSIS — Z0000 Encounter for general adult medical examination without abnormal findings: Secondary | ICD-10-CM

## 2021-07-07 DIAGNOSIS — I10 Essential (primary) hypertension: Secondary | ICD-10-CM

## 2021-07-07 DIAGNOSIS — Z79899 Other long term (current) drug therapy: Secondary | ICD-10-CM | POA: Diagnosis not present

## 2021-07-07 DIAGNOSIS — F411 Generalized anxiety disorder: Secondary | ICD-10-CM

## 2021-07-07 MED ORDER — LOSARTAN POTASSIUM 50 MG PO TABS: 50.0000 mg | ORAL_TABLET | Freq: Every day | ORAL | 3 refills | Status: DC

## 2021-07-07 MED ORDER — HYDROCHLOROTHIAZIDE 25 MG PO TABS: 25.0000 mg | ORAL_TABLET | Freq: Every day | ORAL | 3 refills | Status: DC

## 2021-07-07 MED ORDER — ALPRAZOLAM 1 MG PO TABS: ORAL_TABLET | ORAL | 5 refills | Status: DC

## 2021-07-07 NOTE — Progress Notes (Signed)
 Cindy Blake is a 65 y.o. female presents to office today for annual physical exam examination.    Concerns today include: 1.  None Anxiety disorder, blood pressure have been stable  Diet: fair, Exercise: no structured. Sedentary during work hours Last eye exam: UTD Last dental exam: UTD Last colonoscopy: UTD Last mammogram: UTD, has another dx mammogram scheduled 11/2021. Continued on tamoxifen with occasional hot flashes at night. Last pap smear: UTD Refills needed today: all Immunizations needed: Immunization History  Administered Date(s) Administered   Influenza,inj,Quad PF,6+ Mos 10/11/2015, 09/03/2016, 09/26/2017, 10/08/2018, 10/05/2019   Influenza-Unspecified 09/22/2020   PFIZER(Purple Top)SARS-COV-2 Vaccination 02/14/2020, 03/06/2020   Pneumococcal Conjugate-13 01/09/2017   Pneumococcal Polysaccharide-23 07/07/2021   Tdap 06/05/2018   Zoster Recombinat (Shingrix) 07/07/2021     Past Medical History:  Diagnosis Date   Allergy    History of colon polyps    Hypertension    IBS (irritable bowel syndrome)    Mitral valve disorder    Social History   Socioeconomic History   Marital status: Married    Spouse name: Not on file   Number of children: Not on file   Years of education: Not on file   Highest education level: Not on file  Occupational History   Not on file  Tobacco Use   Smoking status: Never   Smokeless tobacco: Never  Vaping Use   Vaping Use: Never used  Substance and Sexual Activity   Alcohol use: Yes    Comment: rare   Drug use: No   Sexual activity: Not on file  Other Topics Concern   Not on file  Social History Narrative   Not on file   Social Determinants of Health   Financial Resource Strain: Not on file  Food Insecurity: Not on file  Transportation Needs: Not on file  Physical Activity: Not on file  Stress: Not on file  Social Connections: Not on file  Intimate Partner Violence: Not on file   Past Surgical History:   Procedure Laterality Date   ABDOMINAL HYSTERECTOMY  age 30   APPENDECTOMY     CHOLECYSTECTOMY     LASIK  2000   SHOULDER SURGERY Right    bone spur   Family History  Problem Relation Age of Onset   Hypertension Father    Stroke Father    Heart attack Father    Arthritis Father     Current Outpatient Medications:    ALPRAZolam (XANAX) 1 MG tablet, TAKE 1/2 TO 1 TABLET BID AS NEEDED FOR ANXIETY & SLEEP (put on file, may fill 30 days after last rx), Disp: 45 tablet, Rfl: 5   aspirin 81 MG tablet, Take 81 mg by mouth daily., Disp: , Rfl:    Calcium Carbonate (CALCIUM 600 PO), Take 1 tablet by mouth daily., Disp: , Rfl:    Cholecalciferol (VITAMIN D) 2000 UNITS CAPS, Take 1 capsule by mouth daily., Disp: , Rfl:    fexofenadine (ALLEGRA) 180 MG tablet, Take 180 mg by mouth daily. Reported on 12/28/2015, Disp: , Rfl:    hydrochlorothiazide (HYDRODIURIL) 25 MG tablet, TAKE 1 TABLET BY MOUTH EVERY DAY, Disp: 90 tablet, Rfl: 0   losartan (COZAAR) 50 MG tablet, Take 1 tablet (50 mg total) by mouth daily., Disp: 90 tablet, Rfl: 3   Multiple Vitamin (MULTIVITAMIN) tablet, Take 1 tablet by mouth daily., Disp: , Rfl:    potassium chloride (KLOR-CON M10) 10 MEQ tablet, TAKE 1 TABLET TWICE DAILY MON,WED,FRIDAY AND TAKE 1 TABLET DAILY ON   TUE,THUR, SAT, SUN, Disp: 180 tablet, Rfl: 0   tamoxifen (NOLVADEX) 20 MG tablet, Take 20 mg by mouth daily., Disp: , Rfl:   Allergies  Allergen Reactions   Shellfish-Derived Products Hives   Sulfonamide Derivatives     REACTION: hives, itching, swelling     ROS: Review of Systems Pertinent items noted in HPI and remainder of comprehensive ROS otherwise negative.    Physical exam BP 114/60   Pulse 60   Temp (!) 97.4 F (36.3 C)   Ht 5' 3" (1.6 m)   Wt 155 lb 3.2 oz (70.4 kg)   SpO2 100%   BMI 27.49 kg/m  General appearance: alert, cooperative, appears stated age, and no distress Head: Normocephalic, without obvious abnormality, atraumatic Eyes:  negative findings: lids and lashes normal, conjunctivae and sclerae normal, corneas clear, and pupils equal, round, reactive to light and accomodation Ears: normal TM's and external ear canals both ears Nose: Nares normal. Septum midline. Mucosa normal. No drainage or sinus tenderness. Throat: lips, mucosa, and tongue normal; teeth and gums normal Neck: no adenopathy, no carotid bruit, supple, symmetrical, trachea midline, and thyroid not enlarged, symmetric, no tenderness/mass/nodules Back: symmetric, no curvature. ROM normal. No CVA tenderness. Lungs: clear to auscultation bilaterally Heart: regular rate and rhythm, S1, S2 normal, no murmur, click, rub or gallop Abdomen: soft, non-tender; bowel sounds normal; no masses,  no organomegaly Extremities: extremities normal, atraumatic, no cyanosis or edema Pulses: 2+ and symmetric Skin: Skin color, texture, turgor normal. No rashes or lesions Lymph nodes: Cervical, supraclavicular, and axillary nodes normal. Neurologic: Alert and oriented X 3, normal strength and tone. Normal symmetric reflexes. Normal coordination and gait Psych: Mood stable, speech normal, affect appropriate Depression screen [facility] 2/9 07/07/2021 01/06/2021 07/06/2020  Decreased Interest 0 0 0  Down, Depressed, Hopeless 0 0 0  PHQ - 2 Score 0 0 0  Altered sleeping - 0 0  Tired, decreased energy - 0 0  Change in appetite - 0 0  Feeling bad or failure about yourself  - 0 0  Trouble concentrating - 0 0  Moving slowly or fidgety/restless - 0 0  Suicidal thoughts - 0 0  PHQ-9 Score - 0 0  Difficult doing work/chores - - -   GAD 7 : Generalized Anxiety Score 07/07/2021 07/06/2020 10/05/2019  Nervous, Anxious, on Edge 0 1 0  Control/stop worrying 0 1 0  Worry too much - different things 0 1 0  Trouble relaxing 0 0 0  Restless 0 0 0  Easily annoyed or irritable 0 0 0  Afraid - awful might happen 0 0 0  Total GAD 7 Score 0 3 0  Anxiety Difficulty Not difficult at all - -      Assessment/ Plan: Cindy Blake here for annual physical exam.   Annual physical exam  Generalized anxiety disorder - Plan: Drug Screen 10 W/Conf, Serum, CMP14+EGFR, ALPRAZolam (XANAX) 1 MG tablet  Chronic prescription benzodiazepine use - Plan: Drug Screen 10 W/Conf, Serum  Malignant neoplasm of upper-inner quadrant of left breast in female, estrogen receptor positive ([place]) - Plan: CMP14+EGFR, CBC  Essential hypertension - Plan: CMP14+EGFR, Lipid Panel  Up-to-date on preventative care.  She had shingles and pneumococcal vaccinations administered today.  Anxiety is stable.  No concerning symptoms or signs with use of the benzodiazepine.  Continues to use only as needed.  National narcotic database was reviewed and there were no red flags.  CSC and drug screen were obtained as per office policy today  Continued on tamoxifen for breast cancer with plans for diagnostic mammogram in December  Blood pressure well controlled.  Continue current regimen.  Plan for CMP and fasting lipid panel  Follow-up in 4 to 6 months for second shingles vaccination and renewal of alprazolam if needed  Donie Moulton M. Lajuana Ripple, DO

## 2021-07-07 NOTE — Patient Instructions (Signed)
You had labs performed today.  You will be contacted with the results of the labs once they are available, usually in the next 3 business days for routine lab work.  If you have an active my chart account, they will be released to your MyChart.  If you prefer to have these labs released to you via telephone, please let us know.  If you had a pap smear or biopsy performed, expect to be contacted in about 7-10 days.  Health Maintenance, Female Adopting a healthy lifestyle and getting preventive care are important in promoting health and wellness. Ask your health care provider about: The right schedule for you to have regular tests and exams. Things you can do on your own to prevent diseases and keep yourself healthy. What should I know about diet, weight, and exercise? Eat a healthy diet  Eat a diet that includes plenty of vegetables, fruits, low-fat dairy products, and lean protein. Do not eat a lot of foods that are high in solid fats, added sugars, or sodium.  Maintain a healthy weight Body mass index (BMI) is used to identify weight problems. It estimates body fat based on height and weight. Your health care provider can help determineyour BMI and help you achieve or maintain a healthy weight. Get regular exercise Get regular exercise. This is one of the most important things you can do for your health. Most adults should: Exercise for at least 150 minutes each week. The exercise should increase your heart rate and make you sweat (moderate-intensity exercise). Do strengthening exercises at least twice a week. This is in addition to the moderate-intensity exercise. Spend less time sitting. Even light physical activity can be beneficial. Watch cholesterol and blood lipids Have your blood tested for lipids and cholesterol at 65 years of age, then havethis test every 5 years. Have your cholesterol levels checked more often if: Your lipid or cholesterol levels are high. You are older than 65  years of age. You are at high risk for heart disease. What should I know about cancer screening? Depending on your health history and family history, you may need to have cancer screening at various ages. This may include screening for: Breast cancer. Cervical cancer. Colorectal cancer. Skin cancer. Lung cancer. What should I know about heart disease, diabetes, and high blood pressure? Blood pressure and heart disease High blood pressure causes heart disease and increases the risk of stroke. This is more likely to develop in people who have high blood pressure readings, are of African descent, or are overweight. Have your blood pressure checked: Every 3-5 years if you are 40-66 years of age. Every year if you are 39 years old or older. Diabetes Have regular diabetes screenings. This checks your fasting blood sugar level. Have the screening done: Once every three years after age 57 if you are at a normal weight and have a low risk for diabetes. More often and at a younger age if you are overweight or have a high risk for diabetes. What should I know about preventing infection? Hepatitis B If you have a higher risk for hepatitis B, you should be screened for this virus. Talk with your health care provider to find out if you are at risk forhepatitis B infection. Hepatitis C Testing is recommended for: Everyone born from [birth year] through 1965. Anyone with known risk factors for hepatitis C. Sexually transmitted infections (STIs) Get screened for STIs, including gonorrhea and chlamydia, if: You are sexually active and are younger than 65  years of age. You are older than 65 years of age and your health care provider tells you that you are at risk for this type of infection. Your sexual activity has changed since you were last screened, and you are at increased risk for chlamydia or gonorrhea. Ask your health care provider if you are at risk. Ask your health care provider about whether you are at  high risk for HIV. Your health care provider may recommend a prescription medicine to help prevent HIV infection. If you choose to take medicine to prevent HIV, you should first get tested for HIV. You should then be tested every 3 months for as long as you are taking the medicine. Pregnancy If you are about to stop having your period (premenopausal) and you may become pregnant, seek counseling before you get pregnant. Take 400 to 800 micrograms (mcg) of folic acid every day if you become pregnant. Ask for birth control (contraception) if you want to prevent pregnancy. Osteoporosis and menopause Osteoporosis is a disease in which the bones lose minerals and strength with aging. This can result in bone fractures. If you are 51 years old or older, or if you are at risk for osteoporosis and fractures, ask your health care provider if you should: Be screened for bone loss. Take a calcium or vitamin D supplement to lower your risk of fractures. Be given hormone replacement therapy (HRT) to treat symptoms of menopause. Follow these instructions at home: Lifestyle Do not use any products that contain nicotine or tobacco, such as cigarettes, e-cigarettes, and chewing tobacco. If you need help quitting, ask your health care provider. Do not use street drugs. Do not share needles. Ask your health care provider for help if you need support or information about quitting drugs. Alcohol use Do not drink alcohol if: Your health care provider tells you not to drink. You are pregnant, may be pregnant, or are planning to become pregnant. If you drink alcohol: Limit how much you use to 0-1 drink a day. Limit intake if you are breastfeeding. Be aware of how much alcohol is in your drink. In the [place] one drink equals one 12 oz bottle of beer (355 mL), one 5 oz glass of wine (148 mL), or one 1 oz glass of hard liquor (44 mL). General instructions Schedule regular health, dental, and eye exams. Stay current  with your vaccines. Tell your health care provider if: You often feel depressed. You have ever been abused or do not feel safe at home. Summary Adopting a healthy lifestyle and getting preventive care are important in promoting health and wellness. Follow your health care provider's instructions about healthy diet, exercising, and getting tested or screened for diseases. Follow your health care provider's instructions on monitoring your cholesterol and blood pressure. This information is not intended to replace advice given to you by your health care provider. Make sure you discuss any questions you have with your healthcare provider. Document Revised: 11/19/2018 Document Reviewed: 11/19/2018 Elsevier Patient Education  2022 [employer].

## 2021-07-15 LAB — DRUG SCREEN 10 W/CONF, SERUM
Amphetamines, IA: NEGATIVE ng/mL
Barbiturates, IA: NEGATIVE ug/mL
Benzodiazepines, IA: POSITIVE ng/mL — AB
Cocaine & Metabolite, IA: NEGATIVE ng/mL
Methadone, IA: NEGATIVE ng/mL
Opiates, IA: NEGATIVE ng/mL
Oxycodones, IA: NEGATIVE ng/mL
Phencyclidine, IA: NEGATIVE ng/mL
Propoxyphene, IA: NEGATIVE ng/mL
THC(Marijuana) Metabolite, IA: NEGATIVE ng/mL

## 2021-07-15 LAB — BENZODIAZEPINES,MS,WB/SP RFX
7-Aminoclonazepam: NEGATIVE ng/mL
Alprazolam: 33.8 ng/mL
Benzodiazepines Confirm: POSITIVE
Chlordiazepoxide: NEGATIVE
Clonazepam: NEGATIVE ng/mL
Desalkylflurazepam: NEGATIVE ng/mL
Desmethylchlordiazepoxide: NEGATIVE
Desmethyldiazepam: NEGATIVE ng/mL
Diazepam: NEGATIVE ng/mL
Flurazepam: NEGATIVE ng/mL
Lorazepam: NEGATIVE ng/mL
Midazolam: NEGATIVE ng/mL
Oxazepam: NEGATIVE ng/mL
Temazepam: NEGATIVE ng/mL
Triazolam: NEGATIVE ng/mL

## 2021-07-15 LAB — CMP14+EGFR
ALT: 13 IU/L (ref 0–32)
AST: 18 IU/L (ref 0–40)
Albumin/Globulin Ratio: 2.1 (ref 1.2–2.2)
Albumin: 4.4 g/dL (ref 3.8–4.8)
Alkaline Phosphatase: 71 IU/L (ref 44–121)
BUN/Creatinine Ratio: 23 (ref 12–28)
BUN: 16 mg/dL (ref 8–27)
Bilirubin Total: 0.3 mg/dL (ref 0.0–1.2)
CO2: 26 mmol/L (ref 20–29)
Calcium: 8.6 mg/dL — ABNORMAL LOW (ref 8.7–10.3)
Chloride: 101 mmol/L (ref 96–106)
Creatinine, Ser: 0.7 mg/dL (ref 0.57–1.00)
Globulin, Total: 2.1 g/dL (ref 1.5–4.5)
Glucose: 79 mg/dL (ref 65–99)
Potassium: 4.1 mmol/L (ref 3.5–5.2)
Sodium: 140 mmol/L (ref 134–144)
Total Protein: 6.5 g/dL (ref 6.0–8.5)
eGFR: 96 mL/min/{1.73_m2} (ref >59)

## 2021-07-15 LAB — CBC
Hematocrit: 37.6 % (ref 34.0–46.6)
Hemoglobin: 12.8 g/dL (ref 11.1–15.9)
MCH: 30.3 pg (ref 26.6–33.0)
MCHC: 34 g/dL (ref 31.5–35.7)
MCV: 89 fL (ref 79–97)
Platelets: 202 10*3/uL (ref 150–450)
RBC: 4.23 x10E6/uL (ref 3.77–5.28)
RDW: 12.3 % (ref 11.7–15.4)
WBC: 5.7 10*3/uL (ref 3.4–10.8)

## 2021-07-15 LAB — LIPID PANEL
Chol/HDL Ratio: 2.6 ratio (ref 0.0–4.4)
Cholesterol, Total: 193 mg/dL (ref 100–199)
HDL: 75 mg/dL (ref >39)
LDL Chol Calc (NIH): 103 mg/dL — ABNORMAL HIGH (ref 0–99)
Triglycerides: 81 mg/dL (ref 0–149)
VLDL Cholesterol Cal: 15 mg/dL (ref 5–40)

## 2021-07-24 ENCOUNTER — Other Ambulatory Visit: Payer: Self-pay | Admitting: Family Medicine

## 2021-07-24 DIAGNOSIS — E876 Hypokalemia: Secondary | ICD-10-CM

## 2021-10-10 ENCOUNTER — Encounter: Payer: Self-pay | Admitting: Family Medicine

## 2021-10-10 ENCOUNTER — Other Ambulatory Visit: Payer: Self-pay

## 2021-10-10 ENCOUNTER — Ambulatory Visit: Payer: BC Managed Care – PPO | Admitting: Family Medicine

## 2021-10-10 ENCOUNTER — Other Ambulatory Visit: Payer: Self-pay | Admitting: Family Medicine

## 2021-10-10 DIAGNOSIS — F411 Generalized anxiety disorder: Secondary | ICD-10-CM

## 2021-10-10 DIAGNOSIS — Z23 Encounter for immunization: Secondary | ICD-10-CM

## 2021-10-10 DIAGNOSIS — E876 Hypokalemia: Secondary | ICD-10-CM

## 2021-10-10 MED ORDER — ALPRAZOLAM 1 MG PO TABS: ORAL_TABLET | ORAL | 5 refills | Status: DC

## 2021-10-10 NOTE — Progress Notes (Signed)
Subjective: CC: Follow-up anxiety disorder appointment PCP: Janora Norlander, DO EUM:PNTIRW WISDOM SEYBOLD is a 65 y.o. female presenting to clinic today for:  1.  Generalized anxiety disorder with panic Patient reports stability of symptoms.  She continues to use Xanax as needed.  Denies any excessive daytime sedation, falls, dizziness.   ROS: Per HPI  Allergies  Allergen Reactions   Shellfish-Derived Products Hives   Sulfonamide Derivatives     REACTION: hives, itching, swelling   Past Medical History:  Diagnosis Date   Allergy    History of colon polyps    Hypertension    IBS (irritable bowel syndrome)    Mitral valve disorder     Current Outpatient Medications:    ALPRAZolam (XANAX) 1 MG tablet, TAKE 1/2 TO 1 TABLET BID AS NEEDED FOR ANXIETY & SLEEP (put on file, may fill 30 days after last rx), Disp: 45 tablet, Rfl: 5   aspirin 81 MG tablet, Take 81 mg by mouth daily., Disp: , Rfl:    Calcium Carbonate (CALCIUM 600 PO), Take 1 tablet by mouth daily., Disp: , Rfl:    Cholecalciferol (VITAMIN D) 2000 UNITS CAPS, Take 1 capsule by mouth daily., Disp: , Rfl:    fexofenadine (ALLEGRA) 180 MG tablet, Take 180 mg by mouth daily. Reported on 12/28/2015, Disp: , Rfl:    hydrochlorothiazide (HYDRODIURIL) 25 MG tablet, Take 1 tablet (25 mg total) by mouth daily., Disp: 90 tablet, Rfl: 3   losartan (COZAAR) 50 MG tablet, Take 1 tablet (50 mg total) by mouth daily., Disp: 90 tablet, Rfl: 3   Multiple Vitamin (MULTIVITAMIN) tablet, Take 1 tablet by mouth daily., Disp: , Rfl:    potassium chloride (KLOR-CON M10) 10 MEQ tablet, TAKE 1 TABLET TWICE DAILY MON,WED,FRIDAY AND TAKE 1 TABLET DAILY ON TUE,THUR, SAT, SUN, Disp: 120 tablet, Rfl: 0   tamoxifen (NOLVADEX) 20 MG tablet, Take 20 mg by mouth daily., Disp: , Rfl:  Social History   Socioeconomic History   Marital status: Married    Spouse name: Not on file   Number of children: Not on file   Years of education: Not on file   Highest  education level: Not on file  Occupational History   Not on file  Tobacco Use   Smoking status: Never   Smokeless tobacco: Never  Vaping Use   Vaping Use: Never used  Substance and Sexual Activity   Alcohol use: Yes    Comment: rare   Drug use: No   Sexual activity: Not on file  Other Topics Concern   Not on file  Social History Narrative   Not on file   Social Determinants of Health   Financial Resource Strain: Not on file  Food Insecurity: Not on file  Transportation Needs: Not on file  Physical Activity: Not on file  Stress: Not on file  Social Connections: Not on file  Intimate Partner Violence: Not on file   Family History  Problem Relation Age of Onset   Hypertension Father    Stroke Father    Heart attack Father    Arthritis Father     Objective: Office vital signs reviewed. BP (!) 145/63   Pulse (!) 59   Temp (!) 97.5 F (36.4 C)   Ht 5\' 3"  (1.6 m)   Wt 159 lb 9.6 oz (72.4 kg)   SpO2 100%   BMI 28.27 kg/m   Physical Examination:  General: Awake, alert, wel nourished, No acute distress HEENT: Normal; sclera white Pulm:  clear to auscultation bilaterally, no wheezes, rhonchi or rales; normal work of breathing on room air Psych: Mood stable, speech normal, affect appropriate.  Patient is pleasant and interactive  Assessment/ Plan: 65 y.o. female   Generalized anxiety disorder - Plan: ALPRAZolam (XANAX) 1 MG tablet  Anxiety disorder stable.  National narcotic database reviewed and there were no red flags.  She is up-to-date on UDS and CSC.  Follow-up in 6 months, sooner if concerns arise  Orders Placed This Encounter  Procedures   Varicella-zoster vaccine IM (Shingrix)   No orders of the defined types were placed in this encounter.    Janora Norlander, DO [place] [phone]

## 2021-10-30 DIAGNOSIS — Z90710 Acquired absence of both cervix and uterus: Secondary | ICD-10-CM | POA: Diagnosis not present

## 2021-10-30 DIAGNOSIS — Z853 Personal history of malignant neoplasm of breast: Secondary | ICD-10-CM | POA: Diagnosis not present

## 2021-10-30 DIAGNOSIS — Z01419 Encounter for gynecological examination (general) (routine) without abnormal findings: Secondary | ICD-10-CM | POA: Diagnosis not present

## 2021-10-30 DIAGNOSIS — Z13820 Encounter for screening for osteoporosis: Secondary | ICD-10-CM | POA: Diagnosis not present

## 2021-11-14 DIAGNOSIS — M8588 Other specified disorders of bone density and structure, other site: Secondary | ICD-10-CM | POA: Diagnosis not present

## 2021-11-14 DIAGNOSIS — M810 Age-related osteoporosis without current pathological fracture: Secondary | ICD-10-CM | POA: Diagnosis not present

## 2021-11-14 DIAGNOSIS — M85851 Other specified disorders of bone density and structure, right thigh: Secondary | ICD-10-CM | POA: Diagnosis not present

## 2021-11-14 DIAGNOSIS — Z13820 Encounter for screening for osteoporosis: Secondary | ICD-10-CM | POA: Diagnosis not present

## 2021-11-21 DIAGNOSIS — Z170 Estrogen receptor positive status [ER+]: Secondary | ICD-10-CM | POA: Diagnosis not present

## 2021-11-21 DIAGNOSIS — R922 Inconclusive mammogram: Secondary | ICD-10-CM | POA: Diagnosis not present

## 2021-11-21 DIAGNOSIS — C50212 Malignant neoplasm of upper-inner quadrant of left female breast: Secondary | ICD-10-CM | POA: Diagnosis not present

## 2021-11-21 LAB — HM MAMMOGRAPHY

## 2022-01-03 DIAGNOSIS — Z170 Estrogen receptor positive status [ER+]: Secondary | ICD-10-CM | POA: Diagnosis not present

## 2022-01-03 DIAGNOSIS — C50212 Malignant neoplasm of upper-inner quadrant of left female breast: Secondary | ICD-10-CM | POA: Diagnosis not present

## 2022-01-03 DIAGNOSIS — Z1509 Genetic susceptibility to other malignant neoplasm: Secondary | ICD-10-CM | POA: Diagnosis not present

## 2022-01-03 DIAGNOSIS — Z1502 Genetic susceptibility to malignant neoplasm of ovary: Secondary | ICD-10-CM | POA: Diagnosis not present

## 2022-01-03 DIAGNOSIS — Z79810 Long term (current) use of selective estrogen receptor modulators (SERMs): Secondary | ICD-10-CM | POA: Diagnosis not present

## 2022-01-03 DIAGNOSIS — M810 Age-related osteoporosis without current pathological fracture: Secondary | ICD-10-CM | POA: Diagnosis not present

## 2022-01-03 DIAGNOSIS — Z1501 Genetic susceptibility to malignant neoplasm of breast: Secondary | ICD-10-CM | POA: Diagnosis not present

## 2022-01-03 DIAGNOSIS — Z1589 Genetic susceptibility to other disease: Secondary | ICD-10-CM | POA: Diagnosis not present

## 2022-01-12 DIAGNOSIS — M810 Age-related osteoporosis without current pathological fracture: Secondary | ICD-10-CM | POA: Diagnosis not present

## 2022-03-29 ENCOUNTER — Ambulatory Visit: Payer: BC Managed Care – PPO | Admitting: Nurse Practitioner

## 2022-03-29 ENCOUNTER — Ambulatory Visit (INDEPENDENT_AMBULATORY_CARE_PROVIDER_SITE_OTHER): Payer: BC Managed Care – PPO

## 2022-03-29 ENCOUNTER — Encounter: Payer: Self-pay | Admitting: Nurse Practitioner

## 2022-03-29 VITALS — BP 149/68 | HR 69 | Temp 97.8°F | Resp 20 | Ht 63.0 in | Wt 159.0 lb

## 2022-03-29 DIAGNOSIS — M25512 Pain in left shoulder: Secondary | ICD-10-CM | POA: Diagnosis not present

## 2022-03-29 MED ORDER — PREDNISONE 10 MG (21) PO TBPK: ORAL_TABLET | ORAL | 0 refills | Status: DC

## 2022-03-29 MED ORDER — TRAMADOL HCL 50 MG PO TABS
50.0000 mg | ORAL_TABLET | Freq: Three times a day (TID) | ORAL | 0 refills | Status: AC | PRN
Start: 2022-03-29 — End: 2022-04-03

## 2022-03-29 NOTE — Patient Instructions (Signed)

## 2022-03-29 NOTE — Progress Notes (Signed)
? ?  Subjective:  ? ? Patient ID: Cindy Blake, female    DOB: 08/12/1956, 66 y.o.   MRN: [id number] ? ? ?Chief Complaint: Shoulder Pain (Left  No injury that she knows of) ? ? ?Shoulder Pain  ? ?Patient comes I n today c/o left shoulder pain. Woke up yesterday morning with it hurting. She denies any injury. Thinks she may have slept on it funny. Pain increased throughout the day yesterday. As not abe  to sleep due to pain. Now her arm hurts to lift. Rates pain 9/10. She has been alternating tylenol and motrin and that eases it some. Any movement causes shooting pains. Pain radiates down arm. ? ? ? ?Review of Systems  ?Musculoskeletal:  Positive for arthralgias (left shoulder pain).  ? ?   ?Objective:  ? Physical Exam ?Constitutional:   ?   Appearance: Normal appearance.  ?Musculoskeletal:  ?   Comments: Decrease ROM of left shoulder with pain on extension, internal roatation and abduction. ?Grips equal bil  ?Skin: ?   General: Skin is warm.  ?Neurological:  ?   General: No focal deficit present.  ?   Mental Status: She is alert and oriented to person, place, and time.  ?Psychiatric:     ?   Mood and Affect: Mood normal.     ?   Behavior: Behavior normal.  ? ?BP (!) 149/68   Pulse 69   Temp 97.8 ?F (36.6 ?C) (Temporal)   Resp 20   Ht '5\' 3"'$  (1.6 m)   Wt 159 lb (72.1 kg)   SpO2 100%   BMI 28.17 kg/m?  ? ? ?Left shoulder xray- normal-Preliminary reading by Ronnald Collum, FNP  [facility] ? ? ?   ?Assessment & Plan:  ?[name] in today with chief complaint of Shoulder Pain (Left  No injury that she knows of) ? ? ?1. Acute pain of left shoulder ?Rest ?Ice BI ?RTO if no better ?- DG Shoulder Left ? ? ? ?The above assessment and management plan was discussed with the patient. The patient verbalized understanding of and has agreed to the management plan. Patient is aware to call the clinic if symptoms persist or worsen. Patient is aware when to return to the clinic for a follow-up visit. Patient educated on when it is  appropriate to go to the emergency department.  ? ?Mary-Margaret Hassell Done, FNP ? ? ? ?

## 2022-04-02 ENCOUNTER — Telehealth: Payer: Self-pay | Admitting: Family Medicine

## 2022-04-02 DIAGNOSIS — M25512 Pain in left shoulder: Secondary | ICD-10-CM

## 2022-04-02 NOTE — Telephone Encounter (Signed)
Mri normal

## 2022-04-09 ENCOUNTER — Encounter: Payer: Self-pay | Admitting: Family Medicine

## 2022-04-09 ENCOUNTER — Ambulatory Visit (INDEPENDENT_AMBULATORY_CARE_PROVIDER_SITE_OTHER): Payer: BC Managed Care – PPO | Admitting: Family Medicine

## 2022-04-09 VITALS — BP 170/76 | HR 66 | Temp 98.0°F | Ht 63.0 in | Wt 160.8 lb

## 2022-04-09 DIAGNOSIS — C50212 Malignant neoplasm of upper-inner quadrant of left female breast: Secondary | ICD-10-CM

## 2022-04-09 DIAGNOSIS — Z170 Estrogen receptor positive status [ER+]: Secondary | ICD-10-CM

## 2022-04-09 DIAGNOSIS — Z79899 Other long term (current) drug therapy: Secondary | ICD-10-CM

## 2022-04-09 DIAGNOSIS — I10 Essential (primary) hypertension: Secondary | ICD-10-CM

## 2022-04-09 DIAGNOSIS — F411 Generalized anxiety disorder: Secondary | ICD-10-CM | POA: Diagnosis not present

## 2022-04-09 DIAGNOSIS — Z0001 Encounter for general adult medical examination with abnormal findings: Secondary | ICD-10-CM | POA: Diagnosis not present

## 2022-04-09 DIAGNOSIS — Z0000 Encounter for general adult medical examination without abnormal findings: Secondary | ICD-10-CM

## 2022-04-09 DIAGNOSIS — E876 Hypokalemia: Secondary | ICD-10-CM

## 2022-04-09 MED ORDER — ALPRAZOLAM 1 MG PO TABS: ORAL_TABLET | ORAL | 5 refills | Status: DC

## 2022-04-09 MED ORDER — HYDROCHLOROTHIAZIDE 25 MG PO TABS: 25.0000 mg | ORAL_TABLET | Freq: Every day | ORAL | 3 refills | Status: DC

## 2022-04-09 MED ORDER — POTASSIUM CHLORIDE CRYS ER 10 MEQ PO TBCR: EXTENDED_RELEASE_TABLET | ORAL | 2 refills | Status: DC

## 2022-04-09 MED ORDER — LOSARTAN POTASSIUM 50 MG PO TABS: 50.0000 mg | ORAL_TABLET | Freq: Every day | ORAL | 3 refills | Status: DC

## 2022-04-09 NOTE — Patient Instructions (Signed)
Come in for fasting labs. ? ?Preventive Care 82 Years and Older, Female ?Preventive care refers to lifestyle choices and visits with your health care provider that can promote health and wellness. Preventive care visits are also called wellness exams. ?What can I expect for my preventive care visit? ?Counseling ?Your health care provider may ask you questions about your: ?Medical history, including: ?Past medical problems. ?Family medical history. ?Pregnancy and menstrual history. ?History of falls. ?Current health, including: ?Memory and ability to understand (cognition). ?Emotional well-being. ?Home life and relationship well-being. ?Sexual activity and sexual health. ?Lifestyle, including: ?Alcohol, nicotine or tobacco, and drug use. ?Access to firearms. ?Diet, exercise, and sleep habits. ?Work and work Statistician. ?Sunscreen use. ?Safety issues such as seatbelt and bike helmet use. ?Physical exam ?Your health care provider will check your: ?Height and weight. These may be used to calculate your BMI (body mass index). BMI is a measurement that tells if you are at a healthy weight. ?Waist circumference. This measures the distance around your waistline. This measurement also tells if you are at a healthy weight and may help predict your risk of certain diseases, such as type 2 diabetes and high blood pressure. ?Heart rate and blood pressure. ?Body temperature. ?Skin for abnormal spots. ?What immunizations do I need? ? ?Vaccines are usually given at various ages, according to a schedule. Your health care provider will recommend vaccines for you based on your age, medical history, and lifestyle or other factors, such as travel or where you work. ?What tests do I need? ?Screening ?Your health care provider may recommend screening tests for certain conditions. This may include: ?Lipid and cholesterol levels. ?Hepatitis C test. ?Hepatitis B test. ?HIV (human immunodeficiency virus) test. ?STI (sexually transmitted  infection) testing, if you are at risk. ?Lung cancer screening. ?Colorectal cancer screening. ?Diabetes screening. This is done by checking your blood sugar (glucose) after you have not eaten for a while (fasting). ?Mammogram. Talk with your health care provider about how often you should have regular mammograms. ?BRCA-related cancer screening. This may be done if you have a family history of breast, ovarian, tubal, or peritoneal cancers. ?Bone density scan. This is done to screen for osteoporosis. ?Talk with your health care provider about your test results, treatment options, and if necessary, the need for more tests. ?Follow these instructions at home: ?Eating and drinking ? ?Eat a diet that includes fresh fruits and vegetables, whole grains, lean protein, and low-fat dairy products. Limit your intake of foods with high amounts of sugar, saturated fats, and salt. ?Take vitamin and mineral supplements as recommended by your health care provider. ?Do not drink alcohol if your health care provider tells you not to drink. ?If you drink alcohol: ?Limit how much you have to 0-1 drink a day. ?Know how much alcohol is in your drink. In the [place] one drink equals one 12 oz bottle of beer (355 mL), one 5 oz glass of wine (148 mL), or one 1? oz glass of hard liquor (44 mL). ?Lifestyle ?Brush your teeth every morning and night with fluoride toothpaste. Floss one time each day. ?Exercise for at least 30 minutes 5 or more days each week. ?Do not use any products that contain nicotine or tobacco. These products include cigarettes, chewing tobacco, and vaping devices, such as e-cigarettes. If you need help quitting, ask your health care provider. ?Do not use drugs. ?If you are sexually active, practice safe sex. Use a condom or other form of protection in order to prevent STIs. ?  Take aspirin only as told by your health care provider. Make sure that you understand how much to take and what form to take. Work with your health care  provider to find out whether it is safe and beneficial for you to take aspirin daily. ?Ask your health care provider if you need to take a cholesterol-lowering medicine (statin). ?Find healthy ways to manage stress, such as: ?Meditation, yoga, or listening to music. ?Journaling. ?Talking to a trusted person. ?Spending time with friends and family. ?Minimize exposure to UV radiation to reduce your risk of skin cancer. ?Safety ?Always wear your seat belt while driving or riding in a vehicle. ?Do not drive: ?If you have been drinking alcohol. Do not ride with someone who has been drinking. ?When you are tired or distracted. ?While texting. ?If you have been using any mind-altering substances or drugs. ?Wear a helmet and other protective equipment during sports activities. ?If you have firearms in your house, make sure you follow all gun safety procedures. ?What's next? ?Visit your health care provider once a year for an annual wellness visit. ?Ask your health care provider how often you should have your eyes and teeth checked. ?Stay up to date on all vaccines. ?This information is not intended to replace advice given to you by your health care provider. Make sure you discuss any questions you have with your health care provider. ?Document Revised: 05/24/2021 Document Reviewed: 05/24/2021 ?Elsevier Patient Education ? [place]. ? ?

## 2022-04-09 NOTE — Progress Notes (Signed)
? ?Cindy Blake is a 66 y.o. female presents to office today for annual physical exam examination.   ? ?Concerns today include: ?1.  Weight ?Patient has been struggling with weight loss but she admits that she is not exercising regularly at the moment because she injured her left shoulder.  She is currently scheduled for MRI in a couple of weeks.  They thought that perhaps this was a tendinitis but they want to rule out tendon tear etc.  She wishes to pursue lifestyle modification for now ? ?Diet: Fair, Exercise: No structured ?Last colonoscopy: UTD ?Last mammogram: UTD ?Last pap smear: UTD ?Refills needed today: All ?Immunizations needed: ?Immunization History  ?Administered Date(s) Administered  ? Influenza Inj Mdck Quad With Preservative 09/19/2020  ? Influenza,inj,Quad PF,6+ Mos 10/11/2015, 09/03/2016, 09/26/2017, 10/08/2018, 10/05/2019  ? Influenza-Unspecified 09/22/2020, 09/27/2021  ? PFIZER(Purple Top)SARS-COV-2 Vaccination 02/14/2020, 03/06/2020, 09/30/2020, 09/08/2021  ? Pneumococcal Conjugate-13 01/09/2017  ? Pneumococcal Polysaccharide-23 07/07/2021  ? Tdap 06/05/2018  ? Zoster Recombinat (Shingrix) 07/07/2021, 10/10/2021  ? ? ? ?Past Medical History:  ?Diagnosis Date  ? Allergy   ? History of colon polyps   ? Hypertension   ? IBS (irritable bowel syndrome)   ? Mitral valve disorder   ? ?Social History  ? ?Socioeconomic History  ? Marital status: Married  ?  Spouse name: Not on file  ? Number of children: Not on file  ? Years of education: Not on file  ? Highest education level: Not on file  ?Occupational History  ? Not on file  ?Tobacco Use  ? Smoking status: Never  ? Smokeless tobacco: Never  ?Vaping Use  ? Vaping Use: Never used  ?Substance and Sexual Activity  ? Alcohol use: Yes  ?  Comment: rare  ? Drug use: No  ? Sexual activity: Not on file  ?Other Topics Concern  ? Not on file  ?Social History Narrative  ? Not on file  ? ?Social Determinants of Health  ? ?Financial Resource Strain: Not on file   ?Food Insecurity: Not on file  ?Transportation Needs: Not on file  ?Physical Activity: Not on file  ?Stress: Not on file  ?Social Connections: Not on file  ?Intimate Partner Violence: Not on file  ? ?Past Surgical History:  ?Procedure Laterality Date  ? ABDOMINAL HYSTERECTOMY  age 38  ? APPENDECTOMY    ? CHOLECYSTECTOMY    ? LASIK  2000  ? SHOULDER SURGERY Right   ? bone spur  ? ?Family History  ?Problem Relation Age of Onset  ? Hypertension Father   ? Stroke Father   ? Heart attack Father   ? Arthritis Father   ? ? ?Current Outpatient Medications:  ?  ALPRAZolam (XANAX) 1 MG tablet, TAKE 1/2 TO 1 TABLET BID AS NEEDED FOR ANXIETY & SLEEP, Disp: 45 tablet, Rfl: 5 ?  aspirin 81 MG tablet, Take 81 mg by mouth daily., Disp: , Rfl:  ?  Calcium Carbonate (CALCIUM 600 PO), Take 1 tablet by mouth daily., Disp: , Rfl:  ?  Cholecalciferol (VITAMIN D) 2000 UNITS CAPS, Take 1 capsule by mouth daily., Disp: , Rfl:  ?  fexofenadine (ALLEGRA) 180 MG tablet, Take 180 mg by mouth daily. Reported on 12/28/2015, Disp: , Rfl:  ?  hydrochlorothiazide (HYDRODIURIL) 25 MG tablet, Take 1 tablet (25 mg total) by mouth daily., Disp: 90 tablet, Rfl: 3 ?  losartan (COZAAR) 50 MG tablet, Take 1 tablet (50 mg total) by mouth daily., Disp: 90 tablet, Rfl: 3 ?  Multiple Vitamin (MULTIVITAMIN) tablet, Take 1 tablet by mouth daily., Disp: , Rfl:  ?  potassium chloride (KLOR-CON M10) 10 MEQ tablet, TAKE 1 TABLET TWICE DAILY MON,WED,FRIDAY AND TAKE 1 TABLET DAILY ON TUE,THUR, SAT, SUN, Disp: 120 tablet, Rfl: 2 ?  predniSONE (STERAPRED UNI-PAK 21 TAB) 10 MG (21) TBPK tablet, As directed x 6 days, Disp: 21 tablet, Rfl: 0 ?  tamoxifen (NOLVADEX) 20 MG tablet, Take 20 mg by mouth daily., Disp: , Rfl:  ? ?Allergies  ?Allergen Reactions  ? Shellfish-Derived Products Hives  ? Sulfonamide Derivatives   ?  REACTION: hives, itching, swelling  ?  ? ?ROS: ?Review of Systems ?Pertinent items noted in HPI and remainder of comprehensive ROS otherwise negative.    ? ?Physical exam ?BP (!) 170/76   Pulse 66   Temp 98 ?F (36.7 ?C)   Ht _0  (1.6 m)   Wt 160 lb 12.8 oz (72.9 kg)   SpO2 100%   BMI 28.48 kg/m?  ?General appearance: alert, cooperative, appears stated age, and no distress ?Head: Normocephalic, without obvious abnormality, atraumatic ?Eyes: negative findings: lids and lashes normal, conjunctivae and sclerae normal, corneas clear, and pupils equal, round, reactive to light and accomodation ?Ears: normal TM's and external ear canals both ears ?Nose: Nares normal. Septum midline. Mucosa normal. No drainage or sinus tenderness. ?Throat: lips, mucosa, and tongue normal; teeth and gums normal ?Neck: no adenopathy, no carotid bruit, supple, symmetrical, trachea midline, and thyroid not enlarged, symmetric, no tenderness/mass/nodules ?Back: symmetric, no curvature. ROM normal. No CVA tenderness. ?Lungs: clear to auscultation bilaterally ?Heart: regular rate and rhythm, S1, S2 normal, no murmur, click, rub or gallop ?Abdomen: soft, non-tender; bowel sounds normal; no masses,  no organomegaly ?Extremities: extremities normal, atraumatic, no cyanosis or edema ?Pulses: 2+ and symmetric ?Skin: Skin color, texture, turgor normal. No rashes or lesions ?Lymph nodes: Cervical, supraclavicular, and axillary nodes normal. ?Neurologic: Grossly normal ?Psych: Mood stable, speech normal, affect appropriate ? ?  04/09/2022  ?  3:25 PM 10/10/2021  ?  4:20 PM 07/07/2021  ? 11:23 AM  ?Depression screen PHQ 2/9  ?Decreased Interest 0 0 0  ?Down, Depressed, Hopeless 0 0 0  ?PHQ - 2 Score 0 0 0  ?Altered sleeping  0   ?Tired, decreased energy  0   ?Change in appetite  0   ?Feeling bad or failure about yourself   0   ?Trouble concentrating  0   ?Moving slowly or fidgety/restless  0   ?Suicidal thoughts  0   ?PHQ-9 Score  0   ?Difficult doing work/chores  Not difficult at all   ? ? ?  04/09/2022  ?  3:25 PM 10/10/2021  ?  4:20 PM 07/07/2021  ? 11:23 AM 07/06/2020  ?  3:06 PM  ?GAD 7 : Generalized  Anxiety Score  ?Nervous, Anxious, on Edge 0 0 0 1  ?Control/stop worrying 0 0 0 1  ?Worry too much - different things 0 0 0 1  ?Trouble relaxing  0 0 0  ?Restless 0 0 0 0  ?Easily annoyed or irritable 0 0 0 0  ?Afraid - awful might happen 0 0 0 0  ?Total GAD 7 Score  0 0 3  ?Anxiety Difficulty Not difficult at all Not difficult at all Not difficult at all   ? ? ?Assessment/ Plan: ?Cindy Blake here for annual physical exam.  ? ?Annual physical exam ? ?Generalized anxiety disorder - Plan: TSH, CMP14+EGFR, ALPRAZolam (XANAX) 1 MG tablet, CMP14+EGFR,  TSH ? ?Chronic prescription benzodiazepine use ? ?Malignant neoplasm of upper-inner quadrant of left breast in female, estrogen receptor positive ([place]) - Plan: CBC, CBC ? ?Essential hypertension - Plan: Lipid panel, CMP14+EGFR, CMP14+EGFR, Lipid panel ? ?Hypokalemia - Plan: potassium chloride (KLOR-CON M10) 10 MEQ tablet ? ?Had mammogram done in December and I have got this into the scan box ? ?Anxiety disorder is chronic and stable.  Benzo has been renewed.  Controlled substance contract and UDS are up-to-date.  National narcotic database reviewed and there were no red flags ? ?Check fasting labs.  Blood pressure is not at goal.  Recommend recheck in 2 weeks.  If persistently above normal we will advance the losartan to 100 mg daily ?  ? ?Cindy Mccorkel M. Sharnell Knight, DO ? ? ? ? ? ?

## 2022-04-10 LAB — CBC
Hematocrit: 36.6 % (ref 34.0–46.6)
Hemoglobin: 12.6 g/dL (ref 11.1–15.9)
MCH: 30.7 pg (ref 26.6–33.0)
MCHC: 34.4 g/dL (ref 31.5–35.7)
MCV: 89 fL (ref 79–97)
Platelets: 252 10*3/uL (ref 150–450)
RBC: 4.1 x10E6/uL (ref 3.77–5.28)
RDW: 12.2 % (ref 11.7–15.4)
WBC: 7.1 10*3/uL (ref 3.4–10.8)

## 2022-04-10 LAB — CMP14+EGFR
ALT: 12 IU/L (ref 0–32)
AST: 15 IU/L (ref 0–40)
Albumin/Globulin Ratio: 2.1 (ref 1.2–2.2)
Albumin: 4.2 g/dL (ref 3.8–4.8)
Alkaline Phosphatase: 59 IU/L (ref 44–121)
BUN/Creatinine Ratio: 13 (ref 12–28)
BUN: 10 mg/dL (ref 8–27)
Bilirubin Total: 0.3 mg/dL (ref 0.0–1.2)
CO2: 23 mmol/L (ref 20–29)
Calcium: 8.4 mg/dL — ABNORMAL LOW (ref 8.7–10.3)
Chloride: 103 mmol/L (ref 96–106)
Creatinine, Ser: 0.76 mg/dL (ref 0.57–1.00)
Globulin, Total: 2 g/dL (ref 1.5–4.5)
Glucose: 79 mg/dL (ref 70–99)
Potassium: 3.9 mmol/L (ref 3.5–5.2)
Sodium: 142 mmol/L (ref 134–144)
Total Protein: 6.2 g/dL (ref 6.0–8.5)
eGFR: 87 mL/min/{1.73_m2} (ref >59)

## 2022-04-10 LAB — LIPID PANEL
Chol/HDL Ratio: 2.4 ratio (ref 0.0–4.4)
Cholesterol, Total: 191 mg/dL (ref 100–199)
HDL: 80 mg/dL (ref >39)
LDL Chol Calc (NIH): 95 mg/dL (ref 0–99)
Triglycerides: 87 mg/dL (ref 0–149)
VLDL Cholesterol Cal: 16 mg/dL (ref 5–40)

## 2022-04-10 LAB — TSH: TSH: 1.22 u[IU]/mL (ref 0.450–4.500)

## 2022-04-23 ENCOUNTER — Ambulatory Visit (HOSPITAL_COMMUNITY)
Admission: RE | Admit: 2022-04-23 | Discharge: 2022-04-23 | Disposition: A | Discharge status: Home / Self Care | Payer: BC Managed Care – PPO | Source: Ambulatory Visit | Attending: Nurse Practitioner | Admitting: Nurse Practitioner

## 2022-04-23 DIAGNOSIS — M25512 Pain in left shoulder: Secondary | ICD-10-CM | POA: Diagnosis not present

## 2022-04-23 DIAGNOSIS — M75102 Unspecified rotator cuff tear or rupture of left shoulder, not specified as traumatic: Secondary | ICD-10-CM | POA: Diagnosis not present

## 2022-04-23 DIAGNOSIS — M19012 Primary osteoarthritis, left shoulder: Secondary | ICD-10-CM | POA: Diagnosis not present

## 2022-04-23 DIAGNOSIS — M7552 Bursitis of left shoulder: Secondary | ICD-10-CM | POA: Diagnosis not present

## 2022-04-25 ENCOUNTER — Other Ambulatory Visit: Payer: Self-pay | Admitting: Nurse Practitioner

## 2022-04-25 ENCOUNTER — Ambulatory Visit (INDEPENDENT_AMBULATORY_CARE_PROVIDER_SITE_OTHER): Payer: BC Managed Care – PPO | Admitting: Family Medicine

## 2022-04-25 VITALS — BP 136/68

## 2022-04-25 DIAGNOSIS — I10 Essential (primary) hypertension: Secondary | ICD-10-CM

## 2022-04-25 DIAGNOSIS — M7551 Bursitis of right shoulder: Secondary | ICD-10-CM

## 2022-04-25 NOTE — Progress Notes (Signed)
Patient came in for BP check with her machine.  Her machine was reading 167/70 - I took with our machine and got 135/69 and done manually - 136/68. Spoke with the patient about using wall cord vs batteries only and that the machine should be calibrated every so often that she will need to review owner's manuel for information on how to do and how often. Also discussed with the patient red signs and the importance of seeking emergency medical care if any of them arise.

## 2022-04-27 ENCOUNTER — Telehealth: Payer: Self-pay | Admitting: Family Medicine

## 2022-04-27 NOTE — Telephone Encounter (Signed)
Disk up front to p/u - pt aware

## 2022-04-27 NOTE — Telephone Encounter (Signed)
[facility] requesting for patient to bring copies of x ray's that were done at [facility]. She stated that she could pick them up. Please call when ready.

## 2022-05-10 DIAGNOSIS — M75102 Unspecified rotator cuff tear or rupture of left shoulder, not specified as traumatic: Secondary | ICD-10-CM | POA: Diagnosis not present

## 2022-05-10 DIAGNOSIS — M7532 Calcific tendinitis of left shoulder: Secondary | ICD-10-CM | POA: Diagnosis not present

## 2022-05-16 ENCOUNTER — Ambulatory Visit: Payer: BC Managed Care – PPO | Attending: Orthopedic Surgery | Admitting: Physical Therapy

## 2022-05-16 DIAGNOSIS — M25512 Pain in left shoulder: Secondary | ICD-10-CM | POA: Insufficient documentation

## 2022-05-16 NOTE — Therapy (Addendum)
[facility] [place], [place], [address] Phone: [phone]   Fax:  [phone]  Physical Therapy Evaluation  Patient Details  Name: Cindy Blake MRN: [id number] Date of Birth: Jul 21, 1956 Referring Provider (PT): Victorino December MD   Encounter Date: 05/16/2022   PT End of Session - 05/16/22 1252     Visit Number 1    Number of Visits 12    Date for PT Re-Evaluation 06/27/22    Authorization Type FOTO AT LEAST EVERY 5TH VISIT.  PROGRESS NOTE AT 10TH VISIT.  KX MODIFIER AFTER 15 VISITS.    PT Start Time 1117    PT Stop Time 1202    PT Time Calculation (min) 45 min    Activity Tolerance Patient tolerated treatment well    Behavior During Therapy WFL for tasks assessed/performed             Past Medical History:  Diagnosis Date   Allergy    History of colon polyps    Hypertension    IBS (irritable bowel syndrome)    Mitral valve disorder     Past Surgical History:  Procedure Laterality Date   ABDOMINAL HYSTERECTOMY  age 21   APPENDECTOMY     CHOLECYSTECTOMY     LASIK  2000   SHOULDER SURGERY Right    bone spur    There were no vitals filed for this visit.    Subjective Assessment - 05/16/22 1330     Subjective The patient presents to the clinic today for receive PT to her left shoulder.  She woke up aftfer sleeping on her left shoulder about 5 to 6 weeks ago with severe left shoulder pain and an inability to raise her left UE.  A regimen of Prednisone was helpful as well as an injection by Dr. Stann Mainland.  Tylenol and Motrin helps decrease her pain.  Lifting something too heavy and certain movements can increase her pain.    Pertinent History h/o breast cancer 7 years ago, OP, HTN, lumpectomy, cholecystectomy, right shoulder surgery.    Patient Stated Goals Use left UE without pain.    Currently in Pain? No/denies                [facility] PT Assessment - 05/16/22 0001       Assessment   Medical Diagnosis  Unspecified rotator cuff tear of left shoulder.    Referring Provider (PT) Victorino December MD    Onset Date/Surgical Date --   5-6 weeks.     Precautions   Precautions None      Restrictions   Weight Bearing Restrictions No      Balance Screen   Has the patient fallen in the past 6 months No    Has the patient had a decrease in activity level because of a fear of falling?  No    Is the patient reluctant to leave their home because of a fear of falling?  No      Home Environment   Living Environment Private residence      Prior Function   Level of Independence Independent      Observation/Other Assessments   Focus on Therapeutic Outcomes (FOTO)  Complete.      Posture/Postural Control   Posture/Postural Control Postural limitations    Postural Limitations Rounded Shoulders      Deep Tendon Reflexes   DTR Assessment Site Biceps;Brachioradialis;Triceps    Biceps DTR 2+    Brachioradialis DTR 2+  Triceps DTR 2+      ROM / Strength   AROM / PROM / Strength AROM;Strength      AROM   Overall AROM Comments Active bilateral shoulder flexion is equal, full left shoulder ER and essentailly normal behind the back movement.      Strength   Overall Strength Comments Left shoulder strength grade at 4+/5 for all major muscle groups.      Palpation   Palpation comment Tender over left anterior and acromial ridge region.      Special Tests   Other special tests Pain reproduction with a Hawkins-Kennedy test.  (-) Drop Arm test.                        Objective measurements completed on examination: See above findings.       [facility] Adult PT Treatment/Exercise - 05/16/22 0001       Modalities   Modalities Electrical Stimulation;Vasopneumatic      Electrical Stimulation   Electrical Stimulation Location Left ant/lat shoulder.    Electrical Stimulation Action Pre-mod.    Electrical Stimulation Parameters 80-150 Hz x 20 minutes.    Electrical Stimulation Goals  Pain      Vasopneumatic   Number Minutes Vasopneumatic  20 minutes    Vasopnuematic Location  --   Left shoulder with pillow between thorax and elbow.   Vasopneumatic Pressure Low                          PT Long Term Goals - 05/16/22 1432       PT LONG TERM GOAL #1   Title Independent with a HEP.    Time 4    Period Weeks    Status New      PT LONG TERM GOAL #2   Title Perform ADL's with left shoulder pain not > 2/10.    Time 4    Period Weeks    Status New                    Plan - 05/16/22 1344     Clinical Impression Statement The patient presents to OPPT having awakened with pain about 6 weeks ago with severe left shoulder pain.  She has no pain at rest today.  She has palpable tenderness over her left anteior and lateral shoulder region.  She has reproducible pain with a Hawkins-Kennedy test.  Her left shoulder strength is grossly 4+/5 for all major muscle groups.  Patient will benefit from skilled physical therapy intervention to address pain and deficits.    Personal Factors and Comorbidities Comorbidity 1;Other    Comorbidities h/o breast cancer 7 years ago, OP, HTN, lumpectomy, cholecystectomy, right shoulder surgery.    Examination-Activity Limitations Other    Examination-Participation Restrictions Other    Stability/Clinical Decision Making Stable/Uncomplicated    Clinical Decision Making Low    Rehab Potential Excellent    PT Frequency 3x / week    PT Duration 4 weeks    PT Treatment/Interventions ADLs/Self Care Home Management;Cryotherapy;Electrical Stimulation;Ultrasound;Moist Heat;Therapeutic activities;Therapeutic exercise;Manual techniques;Patient/family education;Passive range of motion;Vasopneumatic Device    PT Next Visit Plan Left RW4, RTC strengthening, modalities as needed.    Consulted and Agree with Plan of Care Patient             Patient will benefit from skilled therapeutic intervention in order to improve the  following deficits and impairments:  Pain, Decreased activity  tolerance, Decreased strength  Visit Diagnosis: Acute pain of left shoulder - Plan: PT plan of care cert/re-cert     Problem List Patient Active Problem List   Diagnosis Date Noted   Malignant neoplasm of left female breast ([place]) 05/01/2016   Cancer of upper-inner quadrant of female breast ([place]) 02/01/2015   Generalized anxiety disorder 11/10/2013   Allergic rhinitis 11/10/2013   Hyperlipemia 07/06/2013   Hypertension 07/06/2013   Vitamin D deficiency 07/06/2013   Rationale for Evaluation and Treatment Rehabilitation  Corynn Solberg, [place], PT 05/16/2022, 2:34 PM  [facility] [address] [place], [place], [address] Phone: [phone]   Fax:  [phone]  Name: Cindy Blake MRN: [id number] Date of Birth: 1956/04/17

## 2022-05-22 ENCOUNTER — Encounter: Payer: Self-pay | Admitting: Physical Therapy

## 2022-05-22 ENCOUNTER — Ambulatory Visit: Payer: BC Managed Care – PPO | Admitting: Physical Therapy

## 2022-05-22 DIAGNOSIS — M25512 Pain in left shoulder: Secondary | ICD-10-CM | POA: Diagnosis not present

## 2022-05-22 NOTE — Therapy (Signed)
[facility] [place], [place], [address] Phone: [phone]   Fax:  [phone]  Physical Therapy Treatment  Patient Details  Name: Cindy Blake MRN: [id number] Date of Birth: [birth year] Referring Provider (PT): Victorino December MD   Encounter Date: 05/22/2022   PT End of Session - 05/22/22 1306     Visit Number 2    Number of Visits 12    Date for PT Re-Evaluation 06/27/22    Authorization Type FOTO AT LEAST EVERY 5TH VISIT.  PROGRESS NOTE AT 10TH VISIT.  KX MODIFIER AFTER 15 VISITS.    PT Start Time 1305    PT Stop Time 1343    PT Time Calculation (min) 38 min    Activity Tolerance Patient tolerated treatment well    Behavior During Therapy WFL for tasks assessed/performed             Past Medical History:  Diagnosis Date   Allergy    History of colon polyps    Hypertension    IBS (irritable bowel syndrome)    Mitral valve disorder     Past Surgical History:  Procedure Laterality Date   ABDOMINAL HYSTERECTOMY  age 26   APPENDECTOMY     CHOLECYSTECTOMY     LASIK  2000   SHOULDER SURGERY Right    bone spur    There were no vitals filed for this visit.   Subjective Assessment - 05/22/22 1304     Subjective Feels okay. No pain currently.    Pertinent History h/o breast cancer 7 years ago, OP, HTN, lumpectomy, cholecystectomy, right shoulder surgery.    Patient Stated Goals Use left UE without pain.    Currently in Pain? No/denies                               [facility] Adult PT Treatment/Exercise - 05/22/22 0001       Exercises   Exercises Shoulder      Shoulder Exercises: Supine   Protraction Strengthening;Left;20 reps;Weights    Protraction Weight (lbs) 1    Other Supine Exercises ABCs x1 rep      Shoulder Exercises: Sidelying   External Rotation AROM;Left;20 reps    Flexion Strengthening;Left;20 reps      Shoulder Exercises: Standing   Flexion Strengthening;Left;20  reps;Weights    Shoulder Flexion Weight (lbs) 1    ABduction Strengthening;Left;20 reps;Weights    Shoulder ABduction Weight (lbs) 1    Diagonals AROM;Left;20 reps    Other Standing Exercises L shoulder scaption 1# x20 reps      Modalities   Modalities Vasopneumatic      Vasopneumatic   Number Minutes Vasopneumatic  10 minutes    Vasopnuematic Location  Shoulder    Vasopneumatic Pressure Low    Vasopneumatic Temperature  34                          PT Long Term Goals - 05/16/22 1432       PT LONG TERM GOAL #1   Title Independent with a HEP.    Time 4    Period Weeks    Status New      PT LONG TERM GOAL #2   Title Perform ADL's with left shoulder pain not > 2/10.    Time 4    Period Weeks    Status New  Plan - 05/22/22 1333     Clinical Impression Statement Patient progressed through light therex for strengthening and mobility. Patient reporting main limitation in lifting. Patient reporting only minimal discomfort during therex specifically with shoulder abduction in standing but reported L shoulder soreness by end of therex session. Normal vasopneumatic response noted following removal of the modality    Personal Factors and Comorbidities Comorbidity 1;Other    Comorbidities h/o breast cancer 7 years ago, OP, HTN, lumpectomy, cholecystectomy, right shoulder surgery.    Examination-Activity Limitations Other    Examination-Participation Restrictions Other    Stability/Clinical Decision Making Stable/Uncomplicated    Rehab Potential Excellent    PT Frequency 3x / week    PT Duration 4 weeks    PT Treatment/Interventions ADLs/Self Care Home Management;Cryotherapy;Electrical Stimulation;Ultrasound;Moist Heat;Therapeutic activities;Therapeutic exercise;Manual techniques;Patient/family education;Passive range of motion;Vasopneumatic Device    PT Next Visit Plan Left RW4, RTC strengthening, modalities as needed.    Consulted and Agree  with Plan of Care Patient             Patient will benefit from skilled therapeutic intervention in order to improve the following deficits and impairments:  Pain, Decreased activity tolerance, Decreased strength  Visit Diagnosis: Acute pain of left shoulder     Problem List Patient Active Problem List   Diagnosis Date Noted   Malignant neoplasm of left female breast ([place]) 05/01/2016   Cancer of upper-inner quadrant of female breast ([place]) 02/01/2015   Generalized anxiety disorder 11/10/2013   Allergic rhinitis 11/10/2013   Hyperlipemia 07/06/2013   Hypertension 07/06/2013   Vitamin D deficiency 07/06/2013   Rationale for Evaluation and Treatment Rehabilitation   Standley Brooking, [place] 05/22/2022, 1:47 PM  [facility] [place], [place], [address] Phone: [phone]   Fax:  [phone]  Name: Cindy Blake MRN: [id number] Date of Birth: [birth year]

## 2022-05-25 ENCOUNTER — Ambulatory Visit: Payer: BC Managed Care – PPO | Admitting: *Deleted

## 2022-05-25 DIAGNOSIS — M25512 Pain in left shoulder: Secondary | ICD-10-CM

## 2022-05-25 NOTE — Therapy (Signed)
[facility] [place], [place], [address] Phone: [phone]   Fax:  [phone]  Physical Therapy Treatment  Patient Details  Name: Cindy Blake MRN: [id number] Date of Birth: [birth year] Referring Provider (PT): Victorino December MD   Encounter Date: 05/25/2022   PT End of Session - 05/25/22 1038     Visit Number 3    Number of Visits 12    Date for PT Re-Evaluation 06/27/22    Authorization Type FOTO AT LEAST EVERY 5TH VISIT.  PROGRESS NOTE AT 10TH VISIT.  KX MODIFIER AFTER 15 VISITS.    PT Start Time 0945    PT Stop Time 1039    PT Time Calculation (min) 54 min             Past Medical History:  Diagnosis Date   Allergy    History of colon polyps    Hypertension    IBS (irritable bowel syndrome)    Mitral valve disorder     Past Surgical History:  Procedure Laterality Date   ABDOMINAL HYSTERECTOMY  age 55   APPENDECTOMY     CHOLECYSTECTOMY     LASIK  2000   SHOULDER SURGERY Right    bone spur    There were no vitals filed for this visit.   Subjective Assessment - 05/25/22 0946     Subjective Feels okay. No pain , but having soreness    Pertinent History h/o breast cancer 7 years ago, OP, HTN, lumpectomy, cholecystectomy, right shoulder surgery.    Patient Stated Goals Use left UE without pain.    Currently in Pain? Yes    Pain Score 1     Pain Location Shoulder    Pain Orientation Left    Pain Descriptors / Indicators Sore    Pain Type Acute pain                               [facility] Adult PT Treatment/Exercise - 05/25/22 0001       Exercises   Exercises Shoulder      Shoulder Exercises: Standing   Other Standing Exercises RW 4  and extension x 15 yellow tband   given for HEP with handout      Shoulder Exercises: Pulleys   Flexion 1 minute      Shoulder Exercises: ROM/Strengthening   UBE (Upper Arm Bike) 120 RPMs x 5 mins      Modalities   Modalities Vasopneumatic       Vasopneumatic   Number Minutes Vasopneumatic  10 minutes    Vasopnuematic Location  Shoulder    Vasopneumatic Pressure Low    Vasopneumatic Temperature  34      Manual Therapy   Manual Therapy Soft tissue mobilization    Soft tissue mobilization TPR to LT UT                          PT Long Term Goals - 05/16/22 1432       PT LONG TERM GOAL #1   Title Independent with a HEP.    Time 4    Period Weeks    Status New      PT LONG TERM GOAL #2   Title Perform ADL's with left shoulder pain not > 2/10.    Time 4    Period Weeks    Status New  Plan - 05/25/22 1039     Clinical Impression Statement Pt arrived today with minimal shldr pain and was able  to progress with light strengthening with  RW exs.She was also able to perform UBE without pain and  1 min on pulleys for ROM and did well.Vaso end of RX    Personal Factors and Comorbidities Comorbidity 1;Other    Comorbidities h/o breast cancer 7 years ago, OP, HTN, lumpectomy, cholecystectomy, right shoulder surgery.    Examination-Activity Limitations Other    Stability/Clinical Decision Making Stable/Uncomplicated    Rehab Potential Excellent    PT Frequency 3x / week    PT Treatment/Interventions ADLs/Self Care Home Management;Cryotherapy;Electrical Stimulation;Ultrasound;Moist Heat;Therapeutic activities;Therapeutic exercise;Manual techniques;Patient/family education;Passive range of motion;Vasopneumatic Device    PT Next Visit Plan Left RW4, RTC strengthening, modalities as needed.    Consulted and Agree with Plan of Care Patient             Patient will benefit from skilled therapeutic intervention in order to improve the following deficits and impairments:  Pain, Decreased activity tolerance, Decreased strength  Visit Diagnosis: Acute pain of left shoulder     Problem List Patient Active Problem List   Diagnosis Date Noted   Malignant neoplasm of left female  breast ([place]) 05/01/2016   Cancer of upper-inner quadrant of female breast ([place]) 02/01/2015   Generalized anxiety disorder 11/10/2013   Allergic rhinitis 11/10/2013   Hyperlipemia 07/06/2013   Hypertension 07/06/2013   Vitamin D deficiency 07/06/2013  Rationale for Evaluation and Treatment Rehabilitation   Monesha Monreal,CHRIS, PTA 05/25/2022, 10:49 AM  [facility] [address] [place], [place], [address] Phone: [phone]   Fax:  [phone]  Name: Cindy Blake MRN: [id number] Date of Birth: [birth year]

## 2022-05-29 ENCOUNTER — Ambulatory Visit: Payer: BC Managed Care – PPO | Admitting: Physical Therapy

## 2022-05-29 ENCOUNTER — Encounter: Payer: Self-pay | Admitting: Physical Therapy

## 2022-05-29 DIAGNOSIS — M25512 Pain in left shoulder: Secondary | ICD-10-CM | POA: Diagnosis not present

## 2022-05-29 NOTE — Therapy (Signed)
[facility] [place], [place], [address] Phone: [phone]   Fax:  [phone]  Physical Therapy Treatment  Patient Details  Name: Cindy Blake MRN: [id number] Date of Birth: 1956-05-13 Referring Provider (PT): Victorino December MD   Encounter Date: 05/29/2022   PT End of Session - 05/29/22 1310     Visit Number 4    Number of Visits 12    Date for PT Re-Evaluation 06/27/22    Authorization Type FOTO AT LEAST EVERY 5TH VISIT.  PROGRESS NOTE AT 10TH VISIT.  KX MODIFIER AFTER 15 VISITS.    PT Start Time 1302    PT Stop Time 1340    PT Time Calculation (min) 38 min    Activity Tolerance Patient tolerated treatment well    Behavior During Therapy WFL for tasks assessed/performed             Past Medical History:  Diagnosis Date   Allergy    History of colon polyps    Hypertension    IBS (irritable bowel syndrome)    Mitral valve disorder     Past Surgical History:  Procedure Laterality Date   ABDOMINAL HYSTERECTOMY  age 52   APPENDECTOMY     CHOLECYSTECTOMY     LASIK  2000   SHOULDER SURGERY Right    bone spur    There were no vitals filed for this visit.   Subjective Assessment - 05/29/22 1309     Subjective Feels great and no pain.    Pertinent History h/o breast cancer 7 years ago, OP, HTN, lumpectomy, cholecystectomy, right shoulder surgery.    Patient Stated Goals Use left UE without pain.    Currently in Pain? No/denies                [facility] PT Assessment - 05/29/22 0001       Assessment   Medical Diagnosis Unspecified rotator cuff tear of left shoulder.    Referring Provider (PT) Victorino December MD      Precautions   Precautions None      Restrictions   Weight Bearing Restrictions No                           [facility] Adult PT Treatment/Exercise - 05/29/22 0001       Shoulder Exercises: Standing   Protraction Strengthening;Left;20 reps;Theraband    Theraband Level  (Shoulder Protraction) Level 2 (Red)    Horizontal ABduction Strengthening;Both;20 reps;Theraband    Theraband Level (Shoulder Horizontal ABduction) Level 2 (Red)    External Rotation Strengthening;Left;20 reps;Theraband    Theraband Level (Shoulder External Rotation) Level 2 (Red)    Internal Rotation Strengthening;Left;20 reps;Theraband    Theraband Level (Shoulder Internal Rotation) Level 2 (Red)    Flexion Strengthening;Left;20 reps    Shoulder Flexion Weight (lbs) 2    ABduction Strengthening;Left;20 reps;Weights    Shoulder ABduction Weight (lbs) 2    Extension Strengthening;Left;20 reps;Theraband    Theraband Level (Shoulder Extension) Level 2 (Red)    Row Strengthening;Left;20 reps;Theraband    Theraband Level (Shoulder Row) Level 2 (Red)      Shoulder Exercises: Therapy Ball   Other Therapy Ball Exercises CW and CCW circles x fatigue      Shoulder Exercises: ROM/Strengthening   UBE (Upper Arm Bike) 120 RPMs x /8 mins      Modalities   Modalities Vasopneumatic      Vasopneumatic   Number Minutes Vasopneumatic  10 minutes    Vasopnuematic Location  Shoulder    Vasopneumatic Pressure Low    Vasopneumatic Temperature  34                          PT Long Term Goals - 05/16/22 1432       PT LONG TERM GOAL #1   Title Independent with a HEP.    Time 4    Period Weeks    Status New      PT LONG TERM GOAL #2   Title Perform ADL's with left shoulder pain not > 2/10.    Time 4    Period Weeks    Status New                   Plan - 05/29/22 1334     Clinical Impression Statement Patient presented in clinic with no pain or limitations with ADLs. Patient progressed with strengthenining with no complaints of pain. Minimal technique corrections required during today's session. Normal modalities response noted following removal of the modalities.    Personal Factors and Comorbidities Comorbidity 1;Other    Comorbidities h/o breast cancer 7 years  ago, OP, HTN, lumpectomy, cholecystectomy, right shoulder surgery.    Examination-Activity Limitations Other    Examination-Participation Restrictions Other    Stability/Clinical Decision Making Stable/Uncomplicated    Rehab Potential Excellent    PT Frequency 3x / week    PT Duration 4 weeks    PT Treatment/Interventions ADLs/Self Care Home Management;Cryotherapy;Electrical Stimulation;Ultrasound;Moist Heat;Therapeutic activities;Therapeutic exercise;Manual techniques;Patient/family education;Passive range of motion;Vasopneumatic Device    PT Next Visit Plan Left RW4, RTC strengthening, modalities as needed.    Consulted and Agree with Plan of Care Patient             Patient will benefit from skilled therapeutic intervention in order to improve the following deficits and impairments:  Pain, Decreased activity tolerance, Decreased strength  Visit Diagnosis: Acute pain of left shoulder     Problem List Patient Active Problem List   Diagnosis Date Noted   Malignant neoplasm of left female breast ([place]) 05/01/2016   Cancer of upper-inner quadrant of female breast ([place]) 02/01/2015   Generalized anxiety disorder 11/10/2013   Allergic rhinitis 11/10/2013   Hyperlipemia 07/06/2013   Hypertension 07/06/2013   Vitamin D deficiency 07/06/2013   Rationale for Evaluation and Treatment Rehabilitation   Standley Brooking, [place] 05/29/2022, 1:43 PM  [facility] [address] [place], [place], [address] Phone: [phone]   Fax:  [phone]  Name: Cindy Blake MRN: [id number] Date of Birth: 02-Sep-1956

## 2022-06-01 ENCOUNTER — Ambulatory Visit: Payer: BC Managed Care – PPO

## 2022-06-01 DIAGNOSIS — M25512 Pain in left shoulder: Secondary | ICD-10-CM | POA: Diagnosis not present

## 2022-06-05 ENCOUNTER — Ambulatory Visit: Payer: BC Managed Care – PPO | Admitting: Physical Therapy

## 2022-06-05 ENCOUNTER — Encounter: Payer: Self-pay | Admitting: Physical Therapy

## 2022-06-05 DIAGNOSIS — M25512 Pain in left shoulder: Secondary | ICD-10-CM | POA: Diagnosis not present

## 2022-06-05 NOTE — Therapy (Signed)
[facility] [address] [place], [place], [address] Phone: [phone]   Fax:  [phone]  Physical Therapy Treatment  Patient Details  Name: Cindy Blake MRN: [id number] Date of Birth: 21-May-1956 Referring Provider (PT): Duwayne Heck MD   Encounter Date: 06/05/2022   PT End of Session - 06/05/22 1312     Visit Number 6    Number of Visits 12    Date for PT Re-Evaluation 06/27/22    Authorization Type FOTO AT LEAST EVERY 5TH VISIT.  PROGRESS NOTE AT 10TH VISIT.  KX MODIFIER AFTER 15 VISITS.    PT Start Time 1302    PT Stop Time 1342    PT Time Calculation (min) 40 min    Activity Tolerance Patient tolerated treatment well    Behavior During Therapy WFL for tasks assessed/performed             Past Medical History:  Diagnosis Date   Allergy    History of colon polyps    Hypertension    IBS (irritable bowel syndrome)    Mitral valve disorder     Past Surgical History:  Procedure Laterality Date   ABDOMINAL HYSTERECTOMY  age 9   APPENDECTOMY     CHOLECYSTECTOMY     LASIK  2000   SHOULDER SURGERY Right    bone spur    There were no vitals filed for this visit.   Subjective Assessment - 06/05/22 1311     Subjective Pt arrives for today's treatment session denying any pain.    Pertinent History h/o breast cancer 7 years ago, OP, HTN, lumpectomy, cholecystectomy, right shoulder surgery.    Patient Stated Goals Use left UE without pain.    Currently in Pain? No/denies                [facility] PT Assessment - 06/05/22 0001       Assessment   Medical Diagnosis Unspecified rotator cuff tear of left shoulder.    Referring Provider (PT) Duwayne Heck MD                           [facility] Adult PT Treatment/Exercise - 06/05/22 0001       Shoulder Exercises: Standing   Protraction Strengthening;Left;20 reps;Theraband    Theraband Level (Shoulder Protraction) Level 3 (Green)    Horizontal  ABduction Strengthening;Both;20 reps;Theraband    Theraband Level (Shoulder Horizontal ABduction) Level 1 (Yellow)    External Rotation Strengthening;Left;20 reps;Theraband    Theraband Level (Shoulder External Rotation) Level 3 (Green)    Internal Rotation Strengthening;Left;20 reps;Theraband    Theraband Level (Shoulder Internal Rotation) Level 3 (Green)    Extension Strengthening;Both;20 reps;Limitations    Extension Limitations Blue XTS    Diagonals Strengthening;Left;20 reps;Theraband    Theraband Level (Shoulder Diagonals) Level 1 (Yellow)      Shoulder Exercises: ROM/Strengthening   UBE (Upper Arm Bike) 120 RPM x 8 mins (4 mins forward/backward)    Wall Wash CW and CCW circles x fatigue    Wall Pushups 20 reps      Modalities   Modalities Vasopneumatic      Vasopneumatic   Number Minutes Vasopneumatic  10 minutes    Vasopnuematic Location  Shoulder    Vasopneumatic Pressure Low    Vasopneumatic Temperature  34                          PT Long  Term Goals - 06/01/22 1124       PT LONG TERM GOAL #1   Title Independent with a HEP.    Time 4    Period Weeks    Status On-going      PT LONG TERM GOAL #2   Title Perform ADL's with left shoulder pain not > 2/10.    Time 4    Period Weeks    Status On-going                   Plan - 06/05/22 1337     Clinical Impression Statement Patient arrives in clinic with no current pain but at times activity and HEP does cause soreness. Patient progressed through more advanced resistance with no complaints of pain. No new functional limitations reported during today's PT. Normal vasopneumatic response noted following removal of the modality. Patient compliant with HEP.    Personal Factors and Comorbidities Comorbidity 1;Other    Comorbidities h/o breast cancer 7 years ago, OP, HTN, lumpectomy, cholecystectomy, right shoulder surgery.    Examination-Activity Limitations Other    Examination-Participation  Restrictions Other    Stability/Clinical Decision Making Stable/Uncomplicated    Rehab Potential Excellent    PT Frequency 3x / week    PT Duration 4 weeks    PT Treatment/Interventions ADLs/Self Care Home Management;Cryotherapy;Electrical Stimulation;Ultrasound;Moist Heat;Therapeutic activities;Therapeutic exercise;Manual techniques;Patient/family education;Passive range of motion;Vasopneumatic Device    PT Next Visit Plan Left RW4, RTC strengthening, modalities as needed.    Consulted and Agree with Plan of Care Patient             Patient will benefit from skilled therapeutic intervention in order to improve the following deficits and impairments:  Pain, Decreased activity tolerance, Decreased strength  Visit Diagnosis: Acute pain of left shoulder     Problem List Patient Active Problem List   Diagnosis Date Noted   Malignant neoplasm of left female breast ([facility]) 05/01/2016   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) 02/01/2015   Generalized anxiety disorder 11/10/2013   Allergic rhinitis 11/10/2013   Hyperlipemia 07/06/2013   Hypertension 07/06/2013   Vitamin D deficiency 07/06/2013   Rationale for Evaluation and Treatment Rehabilitation   Marvell Fuller, [place] 06/05/2022, 1:51 PM  [facility] [address] [place], [place], [address] Phone: [phone]   Fax:  [phone]  Name: Cindy Blake MRN: [id number] Date of Birth: [birth year]

## 2022-06-08 ENCOUNTER — Ambulatory Visit: Payer: BC Managed Care – PPO | Admitting: Physical Therapy

## 2022-06-08 ENCOUNTER — Encounter: Payer: Self-pay | Admitting: Physical Therapy

## 2022-06-08 DIAGNOSIS — M25512 Pain in left shoulder: Secondary | ICD-10-CM

## 2022-06-08 NOTE — Therapy (Addendum)
OUTPATIENT PHYSICAL THERAPY TREATMENT NOTE   Patient Name: Cindy Blake MRN: [id number] DOB:May 18, 1956, 66 y.o., female Today's Date: 06/08/2022  REFERRING PROVIDER: Nicholes Stairs, MD    PT End of Session - 06/08/22 1055     Visit Number 7    Number of Visits 12    Date for PT Re-Evaluation 06/27/22    Authorization Type FOTO AT LEAST EVERY 5TH VISIT.  PROGRESS NOTE AT 10TH VISIT.  KX MODIFIER AFTER 15 VISITS.    PT Start Time 0900    PT Stop Time 0951    PT Time Calculation (min) 51 min    Activity Tolerance Patient tolerated treatment well    Behavior During Therapy WFL for tasks assessed/performed             Past Medical History:  Diagnosis Date   Allergy    History of colon polyps    Hypertension    IBS (irritable bowel syndrome)    Mitral valve disorder    Past Surgical History:  Procedure Laterality Date   ABDOMINAL HYSTERECTOMY  age 59   APPENDECTOMY     CHOLECYSTECTOMY     LASIK  2000   SHOULDER SURGERY Right    bone spur   Patient Active Problem List   Diagnosis Date Noted   Malignant neoplasm of left female breast ([place]) 05/01/2016   Cancer of upper-inner quadrant of female breast ([place]) 02/01/2015   Generalized anxiety disorder 11/10/2013   Allergic rhinitis 11/10/2013   Hyperlipemia 07/06/2013   Hypertension 07/06/2013   Vitamin D deficiency 07/06/2013    REFERRING DIAG: Unspecified rotator cuff tear or rupture of left shoulder, not specified as traumatic   THERAPY DIAG:  Acute pain of left shoulder  Rationale for Evaluation and Treatment Rehabilitation  PERTINENT HISTORY: h/o breast cancer 7 years ago, OP, HTN, lumpectomy, cholecystectomy, right shoulder surgery.   PRECAUTIONS: None.  SUBJECTIVE: Patient   PAIN:  Are you having pain? 1/10,  Dull ache     TODAY'S TREATMENT:  UBE x 8 minutes at 90 RPM's. F/b Combo e'stim/US at 1.50 W/CM2 3.3 mHz, 20% x 8 minutes f/b STW/M x 8 minutes to patient's left anterior shoulder  region f/b pre-mod e'stim x 80-150 Hz x 20 minutes.          PT Long Term Goals - 06/08/22 1101       PT LONG TERM GOAL #1   Title Independent with a HEP.    Time 4    Period Weeks    Status On-going      PT LONG TERM GOAL #2   Title Perform ADL's with left shoulder pain not > 2/10.    Time 4    Period Weeks    Status On-going              Plan - 06/08/22 1102     Clinical Impression Statement Patient doing very well.  No pain complaints post treatment and minimal at most palpable pain.    Personal Factors and Comorbidities Comorbidity 1;Other    Comorbidities h/o breast cancer 7 years ago, OP, HTN, lumpectomy, cholecystectomy, right shoulder surgery.    Examination-Activity Limitations Other    Examination-Participation Restrictions Other    Stability/Clinical Decision Making Stable/Uncomplicated    Rehab Potential Excellent    PT Frequency 3x / week    PT Duration 4 weeks    PT Treatment/Interventions ADLs/Self Care Home Management;Cryotherapy;Electrical Stimulation;Ultrasound;Moist Heat;Therapeutic activities;Therapeutic exercise;Manual techniques;Patient/family education;Passive range of motion;Vasopneumatic Device  PT Next Visit Plan Left RW4, RTC strengthening, modalities as needed.    Consulted and Agree with Plan of Care Patient               Mckenlee Mangham, [place], PT 06/08/2022, 12:32 PM

## 2022-06-13 ENCOUNTER — Encounter: Payer: Self-pay | Admitting: Physical Therapy

## 2022-06-13 ENCOUNTER — Ambulatory Visit: Payer: BC Managed Care – PPO | Attending: Orthopedic Surgery | Admitting: Physical Therapy

## 2022-06-13 DIAGNOSIS — M25512 Pain in left shoulder: Secondary | ICD-10-CM | POA: Insufficient documentation

## 2022-06-13 NOTE — Therapy (Signed)
OUTPATIENT PHYSICAL THERAPY TREATMENT NOTE   Patient Name: Cindy Blake MRN: [id number] DOB:February 06, 1956, 66 y.o., female Today's Date: 06/13/2022  REFERRING PROVIDER: Nicholes Stairs, MD    PT End of Session - 06/08/22 1055     Visit Number 8   Number of Visits 12    Date for PT Re-Evaluation 06/27/22    Authorization Type FOTO AT LEAST EVERY 5TH VISIT.  PROGRESS NOTE AT 10TH VISIT.  KX MODIFIER AFTER 15 VISITS.    PT Start Time 1430   PT Stop Time 1515   PT Time Calculation (min) 45 min    Activity Tolerance Patient tolerated treatment well    Behavior During Therapy WFL for tasks assessed/performed             Past Medical History:  Diagnosis Date   Allergy    History of colon polyps    Hypertension    IBS (irritable bowel syndrome)    Mitral valve disorder    Past Surgical History:  Procedure Laterality Date   ABDOMINAL HYSTERECTOMY  age 23   APPENDECTOMY     CHOLECYSTECTOMY     LASIK  2000   SHOULDER SURGERY Right    bone spur   Patient Active Problem List   Diagnosis Date Noted   Malignant neoplasm of left female breast ([place]) 05/01/2016   Cancer of upper-inner quadrant of female breast ([place]) 02/01/2015   Generalized anxiety disorder 11/10/2013   Allergic rhinitis 11/10/2013   Hyperlipemia 07/06/2013   Hypertension 07/06/2013   Vitamin D deficiency 07/06/2013    REFERRING DIAG: Unspecified rotator cuff tear or rupture of left shoulder, not specified as traumatic   THERAPY DIAG: Acute pain of left shoulder  Rationale for Evaluation and Treatment Rehabilitation  PERTINENT HISTORY: h/o breast cancer 7 years ago, OP, HTN, lumpectomy, cholecystectomy, right shoulder surgery.   PRECAUTIONS: None.  SUBJECTIVE: Patient denying any pain.  PAIN:  Are you having pain? No    TODAY'S TREATMENT:                                     EXERCISE LOG  Exercise Repetitions and Resistance Comments  UBE 60 RPM x10 min   Wall clock  12-9 LUE x10 reps    Wall walks Yellow; x2 RT   Wall flexion with abduction [place]; x20 reps   B shoulder row [place]; x20 reps fatigued  B shoulder extension [place]; x20 reps fatigued  L shoulder IR [place]; x20 reps   L shoulder ER [place]; x20 reps fatigued  B shoulder flexion 2# x20 reps fatigued  B shoulder scaption 2# x20 reps   Wall pushups  X20 reps        Blank cell = exercise not performed today   Modalities  Date:  Vaso: Shoulder, low, 10 mins, fatigue /post workout     PT Long Term Goals - 06/08/22 1101       PT LONG TERM GOAL #1   Title Independent with a HEP.    Time 4    Period Weeks    Status Achieved     PT LONG TERM GOAL #2   Title Perform ADL's with left shoulder pain not > 2/10.    Time 4    Period Weeks    Status Achieved             Plan - 06/08/22 1102     Clinical Impression  Statement Patient progressed through shoulder and periscapular strengthening without pain but did have muscle fatigue throughout therex. Patient to see Ortho MD tomorrow for recheck but has met all goals. Patient compliant with HEP and with more exercises that have not been added to HEP but have been completed in PT session. More VCs for scapular retraction throughout therex. Normal vasopnuematic response noted upon removal of the modality.   Personal Factors and Comorbidities Comorbidity 1;Other    Comorbidities h/o breast cancer 7 years ago, OP, HTN, lumpectomy, cholecystectomy, right shoulder surgery.    Examination-Activity Limitations Other    Examination-Participation Restrictions Other    Stability/Clinical Decision Making Stable/Uncomplicated    Rehab Potential Excellent    PT Frequency 3x / week    PT Duration 4 weeks    PT Treatment/Interventions ADLs/Self Care Home Management;Cryotherapy;Electrical Stimulation;Ultrasound;Moist Heat;Therapeutic activities;Therapeutic exercise;Manual techniques;Patient/family education;Passive range of motion;Vasopneumatic Device    PT Next Visit Plan  Continue at MD discretion    Consulted and Agree with Plan of Care Patient            Standley Brooking, PTA 06/13/22 3:30 PM

## 2022-06-14 DIAGNOSIS — M75102 Unspecified rotator cuff tear or rupture of left shoulder, not specified as traumatic: Secondary | ICD-10-CM | POA: Diagnosis not present

## 2022-06-14 DIAGNOSIS — M7532 Calcific tendinitis of left shoulder: Secondary | ICD-10-CM | POA: Diagnosis not present

## 2022-06-19 ENCOUNTER — Encounter: Payer: BC Managed Care – PPO | Admitting: Physical Therapy

## 2022-06-21 ENCOUNTER — Encounter: Payer: BC Managed Care – PPO | Admitting: Physical Therapy

## 2022-06-27 IMAGING — DX DG SHOULDER 2+V*L*
2 series · 2 of 2 positions shown · non-contrast
Comparison: None.

CLINICAL DATA: Left shoulder pain.

EXAM:
LEFT SHOULDER - 2+ VIEW

[shoulder ap]
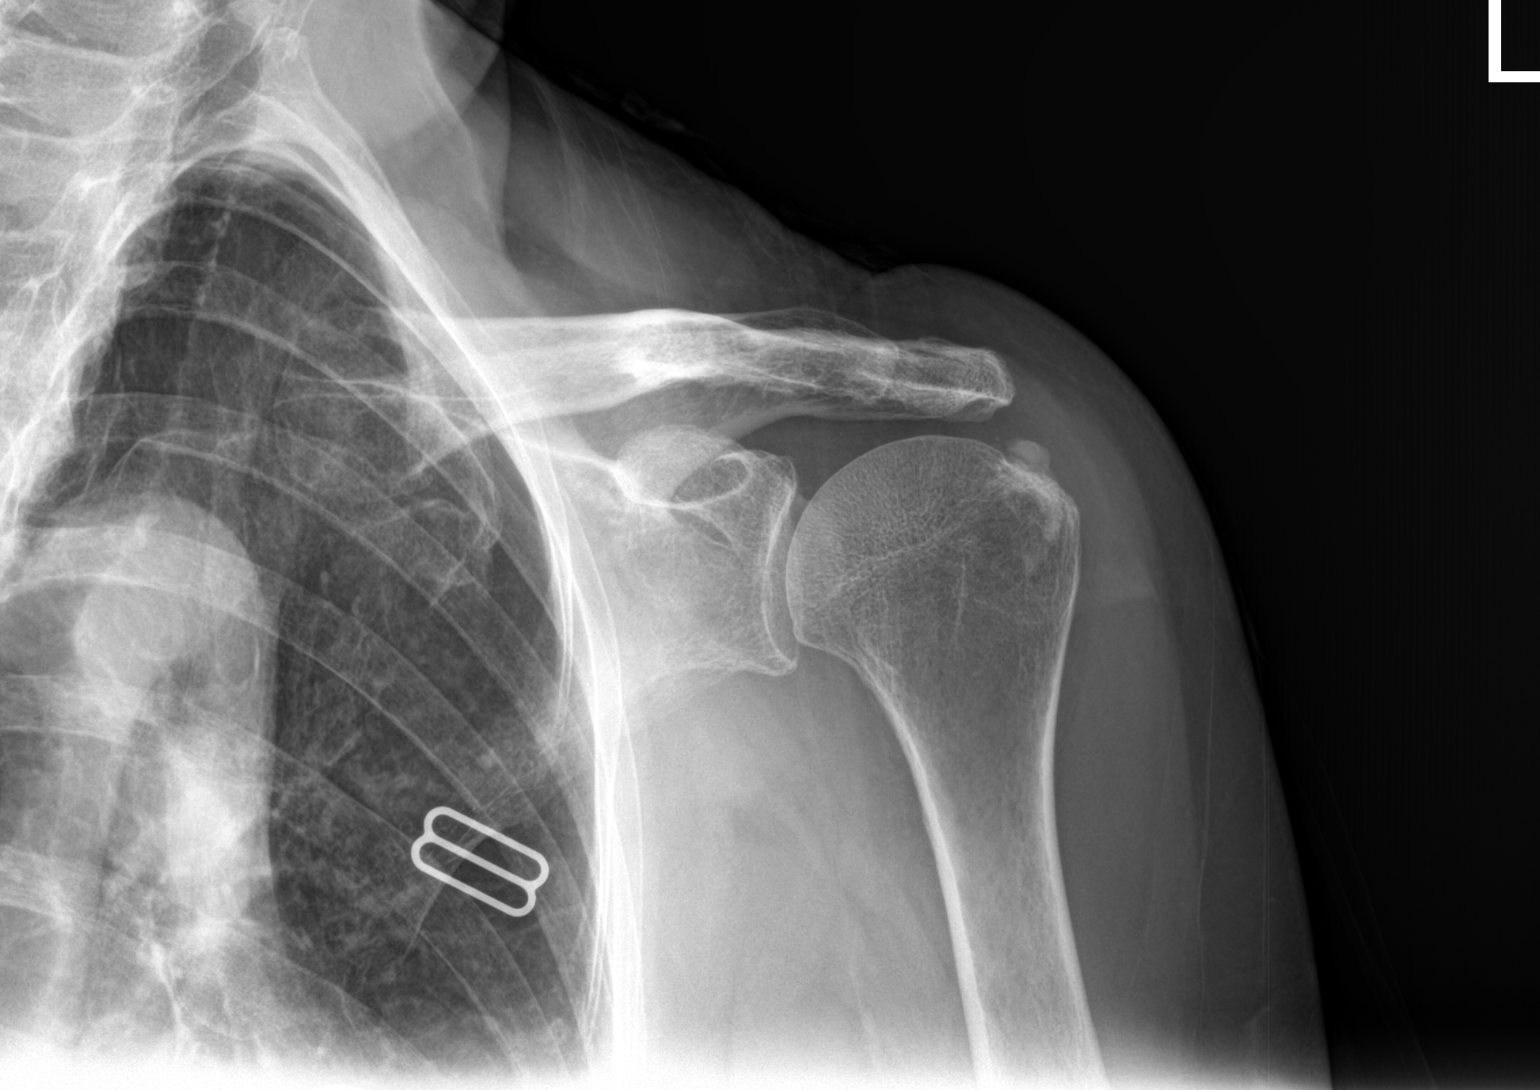

[shoulder obl]
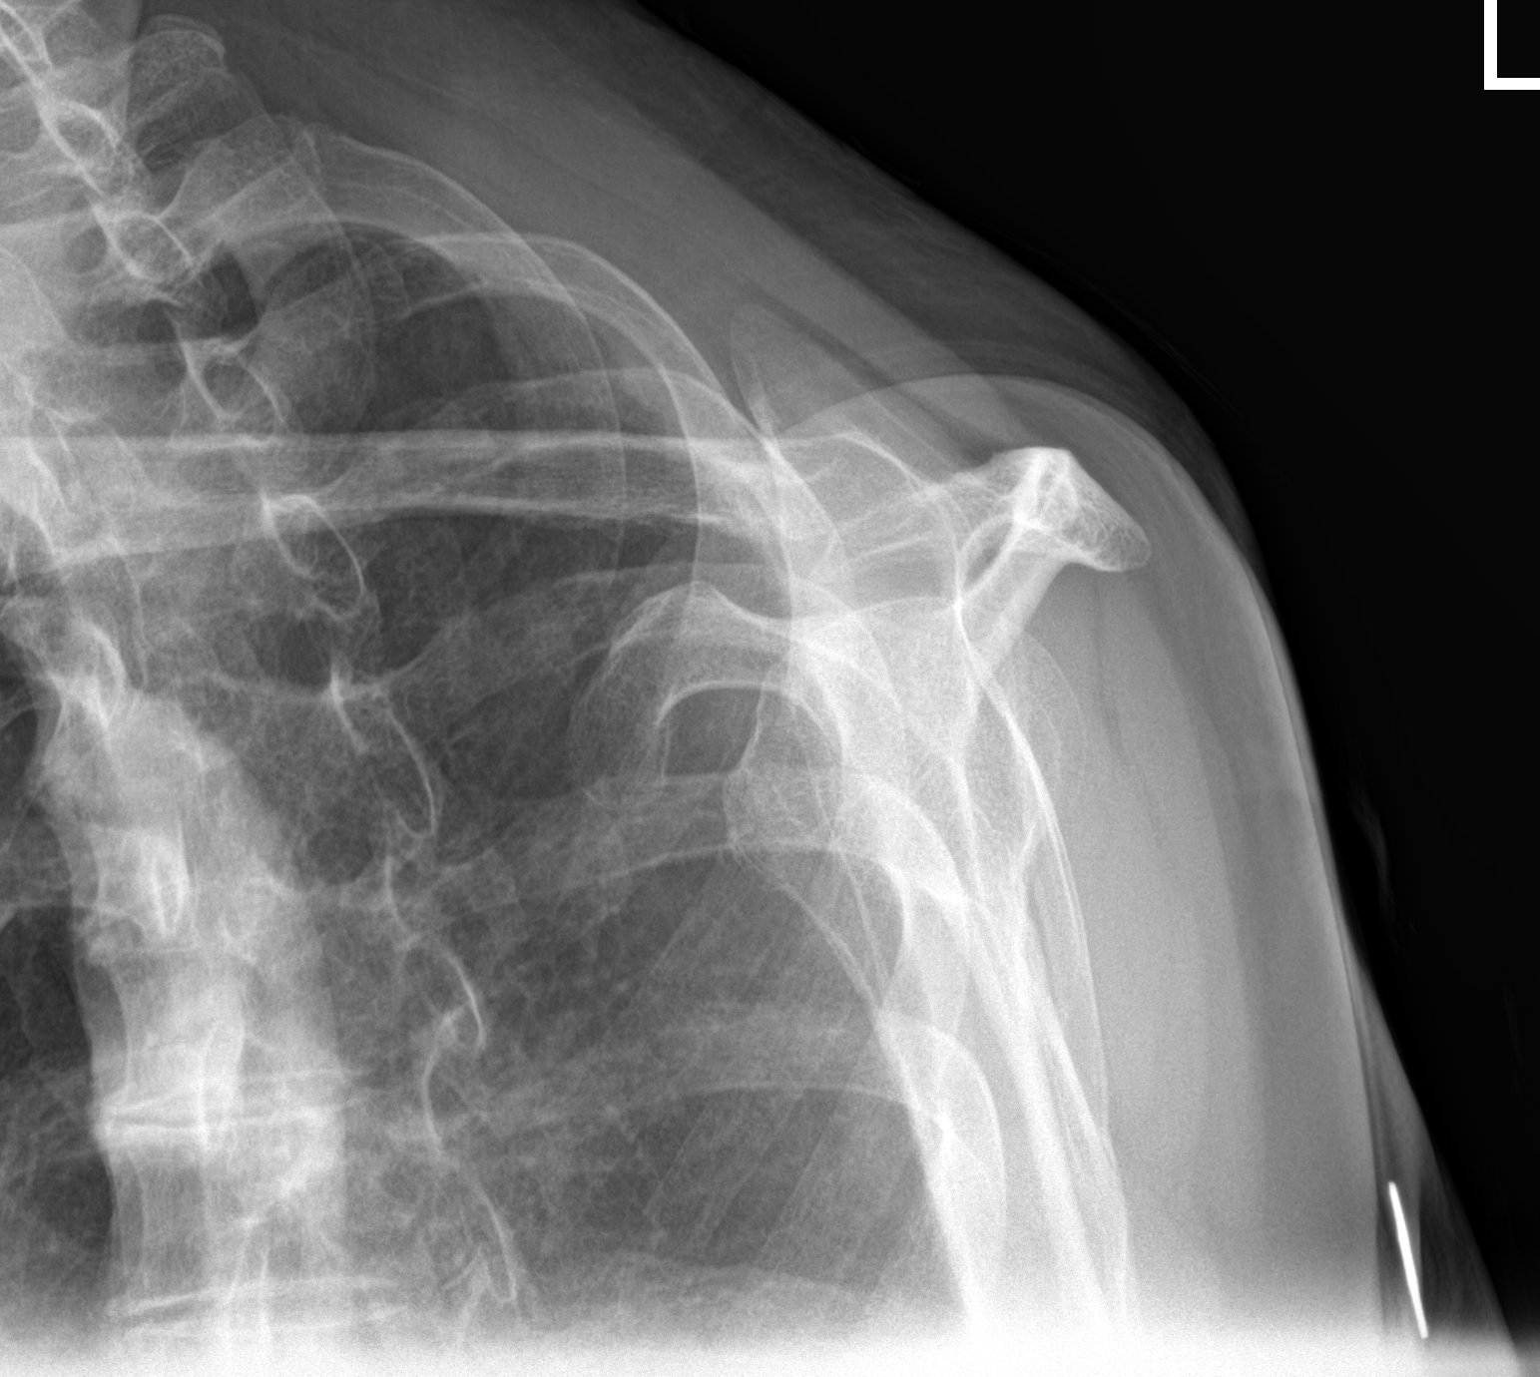

[2 of 2 positions shown; findings below may reference images not displayed]

FINDINGS: Mild superior glenohumeral joint space narrowing. The glenohumeral
acromioclavicular joint spaces are normally aligned. There is
moderate calcific density measuring up to 9 mm just superior to the
lateral aspect of the humeral head likely chronic calcific
tendinosis of the superior rotator cuff insertion. Mild
calcification within aortic arch.
IMPRESSION: Chronic calcific tendinosis of the superior rotator cuff tendon
insertion.

## 2022-07-11 DIAGNOSIS — Z1509 Genetic susceptibility to other malignant neoplasm: Secondary | ICD-10-CM | POA: Diagnosis not present

## 2022-07-11 DIAGNOSIS — C50212 Malignant neoplasm of upper-inner quadrant of left female breast: Secondary | ICD-10-CM | POA: Diagnosis not present

## 2022-07-11 DIAGNOSIS — M810 Age-related osteoporosis without current pathological fracture: Secondary | ICD-10-CM | POA: Diagnosis not present

## 2022-07-11 DIAGNOSIS — Z1501 Genetic susceptibility to malignant neoplasm of breast: Secondary | ICD-10-CM | POA: Diagnosis not present

## 2022-07-11 DIAGNOSIS — Z79810 Long term (current) use of selective estrogen receptor modulators (SERMs): Secondary | ICD-10-CM | POA: Diagnosis not present

## 2022-07-11 DIAGNOSIS — Z170 Estrogen receptor positive status [ER+]: Secondary | ICD-10-CM | POA: Diagnosis not present

## 2022-07-11 DIAGNOSIS — Z1502 Genetic susceptibility to malignant neoplasm of ovary: Secondary | ICD-10-CM | POA: Diagnosis not present

## 2022-07-11 DIAGNOSIS — Z1589 Genetic susceptibility to other disease: Secondary | ICD-10-CM | POA: Diagnosis not present

## 2022-07-27 ENCOUNTER — Encounter: Payer: Self-pay | Admitting: Family Medicine

## 2022-07-27 ENCOUNTER — Ambulatory Visit: Payer: BC Managed Care – PPO | Admitting: Family Medicine

## 2022-07-27 VITALS — BP 115/69 | HR 74 | Temp 98.8°F | Ht 63.0 in | Wt 153.0 lb

## 2022-07-27 DIAGNOSIS — Z8619 Personal history of other infectious and parasitic diseases: Secondary | ICD-10-CM | POA: Diagnosis not present

## 2022-07-27 DIAGNOSIS — J0100 Acute maxillary sinusitis, unspecified: Secondary | ICD-10-CM | POA: Diagnosis not present

## 2022-07-27 MED ORDER — FLUTICASONE PROPIONATE 50 MCG/ACT NA SUSP: 2.0000 | Freq: Every day | NASAL | 6 refills | Status: DC

## 2022-07-27 MED ORDER — AMOXICILLIN-POT CLAVULANATE 875-125 MG PO TABS: 1.0000 | ORAL_TABLET | Freq: Two times a day (BID) | ORAL | 0 refills | Status: AC

## 2022-07-27 MED ORDER — FLUCONAZOLE 150 MG PO TABS: 150.0000 mg | ORAL_TABLET | Freq: Once | ORAL | 0 refills | Status: AC

## 2022-07-27 NOTE — Progress Notes (Signed)
Subjective:  Patient ID: Cindy Blake, female    DOB: 02-Oct-1956, 66 y.o.   MRN: [id number]  Patient Care Team: Janora Norlander, DO as PCP - General (Family Medicine)   Chief Complaint:  Sinus Problem (Sinus pressure, headache X1 week)   HPI: Cindy Blake is a 66 y.o. female presenting on 07/27/2022 for Sinus Problem (Sinus pressure, headache X1 week)   Sinus Problem This is a new problem. The current episode started 1 to 4 weeks ago. The problem has been gradually worsening since onset. There has been no fever. Her pain is at a severity of 5/10. The pain is moderate. Associated symptoms include congestion, headaches, sinus pressure and swollen glands. Pertinent negatives include no chills, coughing, diaphoresis, ear pain, hoarse voice, neck pain, shortness of breath, sneezing or sore throat. Past treatments include acetaminophen. The treatment provided mild relief.    Relevant past medical, surgical, family, and social history reviewed and updated as indicated.  Allergies and medications reviewed and updated. Data reviewed: Chart in Epic.   Past Medical History:  Diagnosis Date   Allergy    History of colon polyps    Hypertension    IBS (irritable bowel syndrome)    Mitral valve disorder     Past Surgical History:  Procedure Laterality Date   ABDOMINAL HYSTERECTOMY  age 73   APPENDECTOMY     CHOLECYSTECTOMY     LASIK  2000   SHOULDER SURGERY Right    bone spur    Social History   Socioeconomic History   Marital status: Married    Spouse name: Not on file   Number of children: Not on file   Years of education: Not on file   Highest education level: Not on file  Occupational History   Not on file  Tobacco Use   Smoking status: Never   Smokeless tobacco: Never  Vaping Use   Vaping Use: Never used  Substance and Sexual Activity   Alcohol use: Yes    Comment: rare   Drug use: No   Sexual activity: Not on file  Other Topics Concern   Not on file   Social History Narrative   Not on file   Social Determinants of Health   Financial Resource Strain: Not on file  Food Insecurity: Not on file  Transportation Needs: Not on file  Physical Activity: Not on file  Stress: Not on file  Social Connections: Not on file  Intimate Partner Violence: Not on file    Outpatient Encounter Medications as of 07/27/2022  Medication Sig   ALPRAZolam (XANAX) 1 MG tablet TAKE 1/2 TO 1 TABLET BID AS NEEDED FOR ANXIETY & SLEEP   amoxicillin-clavulanate (AUGMENTIN) 875-125 MG tablet Take 1 tablet by mouth 2 (two) times daily for 10 days.   aspirin 81 MG tablet Take 81 mg by mouth daily.   Calcium Carbonate (CALCIUM 600 PO) Take 1 tablet by mouth daily.   Cholecalciferol (VITAMIN D) 2000 UNITS CAPS Take 1 capsule by mouth daily.   fexofenadine (ALLEGRA) 180 MG tablet Take 180 mg by mouth daily. Reported on 12/28/2015   fluconazole (DIFLUCAN) 150 MG tablet Take 1 tablet (150 mg total) by mouth once for 1 dose.   fluticasone (FLONASE) 50 MCG/ACT nasal spray Place 2 sprays into both nostrils daily.   hydrochlorothiazide (HYDRODIURIL) 25 MG tablet Take 1 tablet (25 mg total) by mouth daily.   losartan (COZAAR) 50 MG tablet Take 1 tablet (50 mg total) by mouth  daily.   Multiple Vitamin (MULTIVITAMIN) tablet Take 1 tablet by mouth daily.   potassium chloride (KLOR-CON M10) 10 MEQ tablet TAKE 1 TABLET TWICE DAILY MON,WED,FRIDAY AND TAKE 1 TABLET DAILY ON TUE,THUR, SAT, SUN   tamoxifen (NOLVADEX) 20 MG tablet Take 20 mg by mouth daily.   Zoledronic Acid (ZOMETA IV) Inject into the vein.   No facility-administered encounter medications on file as of 07/27/2022.    Allergies  Allergen Reactions   Shellfish-Derived Products Hives   Sulfonamide Derivatives     REACTION: hives, itching, swelling    Review of Systems  Constitutional:  Positive for fatigue. Negative for activity change, appetite change, chills, diaphoresis, fever and unexpected weight change.   HENT:  Positive for congestion, postnasal drip, sinus pressure and sinus pain. Negative for dental problem, drooling, ear discharge, ear pain, facial swelling, hearing loss, hoarse voice, mouth sores, nosebleeds, rhinorrhea, sneezing, sore throat, tinnitus, trouble swallowing and voice change.   Respiratory:  Negative for cough and shortness of breath.   Cardiovascular:  Negative for chest pain, palpitations and leg swelling.  Genitourinary:  Negative for decreased urine volume and difficulty urinating.  Musculoskeletal:  Positive for arthralgias and myalgias. Negative for neck pain.  Neurological:  Positive for headaches. Negative for dizziness, tremors, seizures, syncope, facial asymmetry, speech difficulty, weakness, light-headedness and numbness.  Psychiatric/Behavioral:  Negative for confusion.   All other systems reviewed and are negative.       Objective:  BP 115/69   Pulse 74   Temp 98.8 F (37.1 C)   Ht 5' 3" (1.6 m)   Wt 153 lb (69.4 kg)   SpO2 94%   BMI 27.10 kg/m    Wt Readings from Last 3 Encounters:  07/27/22 153 lb (69.4 kg)  04/09/22 160 lb 12.8 oz (72.9 kg)  03/29/22 159 lb (72.1 kg)    Physical Exam Vitals and nursing note reviewed.  Constitutional:      General: She is not in acute distress.    Appearance: Normal appearance. She is well-developed, well-groomed and overweight. She is not ill-appearing, toxic-appearing or diaphoretic.  HENT:     Head: Normocephalic and atraumatic.     Right Ear: Hearing, ear canal and external ear normal. A middle ear effusion is present. Tympanic membrane is not erythematous.     Left Ear: Hearing, ear canal and external ear normal. A middle ear effusion is present. Tympanic membrane is not erythematous.     Nose: Congestion present.     Right Turbinates: Swollen.     Left Turbinates: Swollen.     Right Sinus: Maxillary sinus tenderness present. No frontal sinus tenderness.     Left Sinus: Maxillary sinus tenderness  present. No frontal sinus tenderness.     Mouth/Throat:     Lips: Pink.     Mouth: Mucous membranes are moist.     Pharynx: Posterior oropharyngeal erythema present. No pharyngeal swelling, oropharyngeal exudate or uvula swelling.     Tonsils: No tonsillar exudate or tonsillar abscesses.     Comments: Postnasal drainage Eyes:     Conjunctiva/sclera: Conjunctivae normal.     Pupils: Pupils are equal, round, and reactive to light.  Cardiovascular:     Rate and Rhythm: Normal rate and regular rhythm.     Heart sounds: Normal heart sounds. No murmur heard.    No friction rub. No gallop.  Pulmonary:     Effort: Pulmonary effort is normal.     Breath sounds: Normal breath sounds. No rhonchi or  rales.  Musculoskeletal:     Cervical back: Normal range of motion and neck supple.     Right lower leg: No edema.     Left lower leg: No edema.  Lymphadenopathy:     Cervical: Cervical adenopathy present.  Skin:    General: Skin is warm and dry.     Capillary Refill: Capillary refill takes less than 2 seconds.  Neurological:     General: No focal deficit present.     Mental Status: She is alert and oriented to person, place, and time.  Psychiatric:        Mood and Affect: Mood normal.        Behavior: Behavior normal. Behavior is cooperative.        Thought Content: Thought content normal.        Judgment: Judgment normal.     Results for orders placed or performed in visit on 04/09/22  CBC  Result Value Ref Range   WBC 7.1 3.4 - 10.8 x10E3/uL   RBC 4.10 3.77 - 5.28 x10E6/uL   Hemoglobin 12.6 11.1 - 15.9 g/dL   Hematocrit 36.6 34.0 - 46.6 %   MCV 89 79 - 97 fL   MCH 30.7 26.6 - 33.0 pg   MCHC 34.4 31.5 - 35.7 g/dL   RDW 12.2 11.7 - 15.4 %   Platelets 252 150 - 450 x10E3/uL  CMP14+EGFR  Result Value Ref Range   Glucose 79 70 - 99 mg/dL   BUN 10 8 - 27 mg/dL   Creatinine, Ser 0.76 0.57 - 1.00 mg/dL   eGFR 87 >59 mL/min/1.73   BUN/Creatinine Ratio 13 12 - 28   Sodium 142 134 -  144 mmol/L   Potassium 3.9 3.5 - 5.2 mmol/L   Chloride 103 96 - 106 mmol/L   CO2 23 20 - 29 mmol/L   Calcium 8.4 (L) 8.7 - 10.3 mg/dL   Total Protein 6.2 6.0 - 8.5 g/dL   Albumin 4.2 3.8 - 4.8 g/dL   Globulin, Total 2.0 1.5 - 4.5 g/dL   Albumin/Globulin Ratio 2.1 1.2 - 2.2   Bilirubin Total 0.3 0.0 - 1.2 mg/dL   Alkaline Phosphatase 59 44 - 121 IU/L   AST 15 0 - 40 IU/L   ALT 12 0 - 32 IU/L  TSH  Result Value Ref Range   TSH 1.220 0.450 - 4.500 uIU/mL  Lipid panel  Result Value Ref Range   Cholesterol, Total 191 100 - 199 mg/dL   Triglycerides 87 0 - 149 mg/dL   HDL 80 >39 mg/dL   VLDL Cholesterol Cal 16 5 - 40 mg/dL   LDL Chol Calc (NIH) 95 0 - 99 mg/dL   Chol/HDL Ratio 2.4 0.0 - 4.4 ratio       Pertinent labs & imaging results that were available during my care of the patient were reviewed by me and considered in my medical decision making.  Assessment & Plan:  Wilhelmina was seen today for sinus problem.  Diagnoses and all orders for this visit:  Acute non-recurrent maxillary sinusitis 1. Take meds as prescribed 2. Use a cool mist humidifier especially during the winter months and when heat has been humid. 3. Use saline nose sprays frequently. 4. Saline irrigations of the nose can be very helpful if done frequently. Use 4 times daily for one week. Can use Nettie Pot if able to tolerate.  5. Drink plenty of fluids 6. Keep thermostat turned down low. 7.For any cough or congestion: Use plain  Mucinex- regular strength or max strength is fine. 8. For fever or aces or pains- take tylenol or ibuprofen appropriate for age and weight, for fevers greater than 101 orally you may alternate ibuprofen and tylenol every 3 hours. -     amoxicillin-clavulanate (AUGMENTIN) 875-125 MG tablet; Take 1 tablet by mouth 2 (two) times daily for 10 days. -     fluticasone (FLONASE) 50 MCG/ACT nasal spray; Place 2 sprays into both nostrils daily.  History of candidal vulvovaginitis Aware of dosing  indications.  -     fluconazole (DIFLUCAN) 150 MG tablet; Take 1 tablet (150 mg total) by mouth once for 1 dose.     Continue all other maintenance medications.  Follow up plan: Return if symptoms worsen or fail to improve.   Continue healthy lifestyle choices, including diet (rich in fruits, vegetables, and lean proteins, and low in salt and simple carbohydrates) and exercise (at least 30 minutes of moderate physical activity daily).  Educational handout given for sinusitis   The above assessment and management plan was discussed with the patient. The patient verbalized understanding of and has agreed to the management plan. Patient is aware to call the clinic if they develop any new symptoms or if symptoms persist or worsen. Patient is aware when to return to the clinic for a follow-up visit. Patient educated on when it is appropriate to go to the emergency department.   Monia Pouch, FNP-C [facility] [phone]

## 2022-08-10 ENCOUNTER — Ambulatory Visit: Payer: BC Managed Care – PPO | Admitting: Family

## 2022-08-10 ENCOUNTER — Encounter: Payer: Self-pay | Admitting: Family

## 2022-08-10 VITALS — BP 121/64 | HR 74 | Temp 97.9°F | Ht 63.0 in | Wt 151.8 lb

## 2022-08-10 DIAGNOSIS — J019 Acute sinusitis, unspecified: Secondary | ICD-10-CM | POA: Diagnosis not present

## 2022-08-10 MED ORDER — DOXYCYCLINE HYCLATE 100 MG PO TABS: 100.0000 mg | ORAL_TABLET | Freq: Two times a day (BID) | ORAL | 0 refills | Status: DC

## 2022-08-10 NOTE — Progress Notes (Signed)
Subjective:    Patient ID: Cindy Blake, female    DOB: 08-20-1956, 66 y.o.   MRN: [id number]  Chief Complaint  Patient presents with   Sinusitis   PT presents to the office today with sinus problems that started over 3 weeks ago. She was seen in the office two weeks ago and given Augmentin. Reports it mildly improved her symptoms. Reports continued headache and jaw pain. States since completing the antibiotics her symptoms have worsen.  Sinusitis This is a recurrent problem. The current episode started 1 to 4 weeks ago. The problem has been gradually worsening since onset. There has been no fever. Her pain is at a severity of 10/10. The pain is mild. Associated symptoms include congestion, ear pain, headaches, sinus pressure, sneezing and a sore throat (right). Pertinent negatives include no coughing, diaphoresis, shortness of breath or swollen glands. Past treatments include antibiotics and oral decongestants. The treatment provided mild relief.      Review of Systems  Constitutional:  Negative for diaphoresis.  HENT:  Positive for congestion, ear pain, sinus pressure, sneezing and sore throat (right).   Respiratory:  Negative for cough and shortness of breath.   Neurological:  Positive for headaches.  All other systems reviewed and are negative.      Objective:   Physical Exam Vitals reviewed.  Constitutional:      General: She is not in acute distress.    Appearance: She is well-developed.  HENT:     Head: Normocephalic and atraumatic.     Right Ear: Tympanic membrane normal.     Left Ear: Tympanic membrane normal.     Nose:     Right Sinus: Maxillary sinus tenderness and frontal sinus tenderness present.     Left Sinus: Maxillary sinus tenderness and frontal sinus tenderness present.  Eyes:     Pupils: Pupils are equal, round, and reactive to light.  Neck:     Thyroid: No thyromegaly.  Cardiovascular:     Rate and Rhythm: Normal rate and regular rhythm.     Heart  sounds: Normal heart sounds. No murmur heard. Pulmonary:     Effort: Pulmonary effort is normal. No respiratory distress.     Breath sounds: Normal breath sounds. No wheezing.  Abdominal:     General: Bowel sounds are normal. There is no distension.     Palpations: Abdomen is soft.     Tenderness: There is no abdominal tenderness.  Musculoskeletal:        General: No tenderness. Normal range of motion.     Cervical back: Normal range of motion and neck supple.  Skin:    General: Skin is warm and dry.  Neurological:     Mental Status: She is alert and oriented to person, place, and time.     Cranial Nerves: No cranial nerve deficit.     Deep Tendon Reflexes: Reflexes are normal and symmetric.  Psychiatric:        Behavior: Behavior normal.        Thought Content: Thought content normal.        Judgment: Judgment normal.      BP 121/64   Pulse 74   Temp 97.9 F (36.6 C) (Temporal)   Ht '5\' 3"'$  (1.6 m)   Wt 151 lb 12.8 oz (68.9 kg)   SpO2 99%   BMI 26.89 kg/m       Assessment & Plan:  Cindy Blake comes in today with chief complaint of Sinusitis  Diagnosis and orders addressed:  1. Acute sinusitis, recurrence not specified, unspecified location - Take meds as prescribed - Use a cool mist humidifier  -Use saline nose sprays frequently -Force fluids -For any cough or congestion  Use plain Mucinex- regular strength or max strength is fine -For fever or aces or pains- take tylenol or ibuprofen. -Throat lozenges if help -Follow up if symptoms worsen or do not improve  - doxycycline (VIBRA-TABS) 100 MG tablet; Take 1 tablet (100 mg total) by mouth 2 (two) times daily.  Dispense: 20 tablet; Refill: 0  Evelina Dun, FNP

## 2022-08-10 NOTE — Patient Instructions (Signed)

## 2022-08-14 ENCOUNTER — Telehealth: Payer: Self-pay | Admitting: Family Medicine

## 2022-08-14 MED ORDER — PREDNISONE 10 MG (21) PO TBPK: ORAL_TABLET | ORAL | 0 refills | Status: DC

## 2022-08-14 NOTE — Telephone Encounter (Signed)
Patient aware and verbalizes understanding. 

## 2022-08-14 NOTE — Addendum Note (Signed)
Addended by: Evelina Dun A on: 08/14/2022 12:54 PM   Modules accepted: Orders

## 2022-08-14 NOTE — Telephone Encounter (Signed)
Patient had a visit with Christy on 9/1.  Patient states that she was told if she was no better to call back today.  Patient states that her congestion is no better and she started having a cough yesterday.  Please advise

## 2022-08-14 NOTE — Telephone Encounter (Signed)
Patient was seen on 9/1 for sinus congestion, was told if she was no better by today to call back. She is not feeling any better and wants to know if something can be called in. Please call back and advise.

## 2022-08-14 NOTE — Telephone Encounter (Signed)
Prednisone Prescription sent to pharmacy, continue doxycycline.

## 2022-08-16 ENCOUNTER — Encounter: Payer: Self-pay | Admitting: Family Medicine

## 2022-08-29 DIAGNOSIS — J3489 Other specified disorders of nose and nasal sinuses: Secondary | ICD-10-CM | POA: Diagnosis not present

## 2022-08-29 DIAGNOSIS — R6884 Jaw pain: Secondary | ICD-10-CM | POA: Diagnosis not present

## 2022-08-29 DIAGNOSIS — Z0110 Encounter for examination of ears and hearing without abnormal findings: Secondary | ICD-10-CM | POA: Diagnosis not present

## 2022-09-07 DIAGNOSIS — J3489 Other specified disorders of nose and nasal sinuses: Secondary | ICD-10-CM | POA: Diagnosis not present

## 2022-09-17 DIAGNOSIS — M316 Other giant cell arteritis: Secondary | ICD-10-CM | POA: Diagnosis not present

## 2022-09-17 DIAGNOSIS — H47011 Ischemic optic neuropathy, right eye: Secondary | ICD-10-CM | POA: Diagnosis not present

## 2022-09-18 ENCOUNTER — Encounter: Payer: Self-pay | Admitting: Family Medicine

## 2022-09-18 ENCOUNTER — Ambulatory Visit: Payer: BC Managed Care – PPO | Admitting: Family Medicine

## 2022-09-18 DIAGNOSIS — H539 Unspecified visual disturbance: Secondary | ICD-10-CM | POA: Diagnosis not present

## 2022-09-18 DIAGNOSIS — M2669 Other specified disorders of temporomandibular joint: Secondary | ICD-10-CM | POA: Diagnosis not present

## 2022-09-18 NOTE — Progress Notes (Signed)
Telephone visit  Subjective: CC:?GCA PCP: Janora Norlander, DO XBM:[id number] Cindy Blake is a 66 y.o. female calls for telephone consult today. Patient provides verbal consent for consult held via phone.  Due to COVID-19 pandemic this visit was conducted virtually. This visit type was conducted due to national recommendations for restrictions regarding the COVID-19 Pandemic (e.g. social distancing, sheltering in place) in an effort to limit this patient's exposure and mitigate transmission in our community. All issues noted in this document were discussed and addressed.  A physical exam was not performed with this format.   Location of patient: home Location of provider: [facility] Others present for call: none  1. ?GCA She has lost a lot of weight due to pain in her RIGHT jaw with eating.  Saw her eye doctor yesterday and there is concern for GCA.  She saw her other specialist this morning for what she thought was TMJ but they agree that there is concern for GCA.  She notes that she presented to her eye doctor after she started developing floaters and later a filmlike substance in her vision and then ultimately she just had a blackout of her vision on the right.  She was prescribed prednisone but just picked it up and has not yet started it.  She had labs drawn yesterday but has not received results for it yet.  ROS: Per HPI  Allergies  Allergen Reactions   Shellfish-Derived Products Hives   Sulfonamide Derivatives     REACTION: hives, itching, swelling   Past Medical History:  Diagnosis Date   Allergy    History of colon polyps    Hypertension    IBS (irritable bowel syndrome)    Mitral valve disorder     Current Outpatient Medications:    ALPRAZolam (XANAX) 1 MG tablet, TAKE 1/2 TO 1 TABLET BID AS NEEDED FOR ANXIETY & SLEEP, Disp: 45 tablet, Rfl: 5   aspirin 81 MG tablet, Take 81 mg by mouth daily., Disp: , Rfl:    Calcium Carbonate (CALCIUM 600 PO), Take 1 tablet by mouth daily., Disp:  , Rfl:    Cholecalciferol (VITAMIN D) 2000 UNITS CAPS, Take 1 capsule by mouth daily., Disp: , Rfl:    fexofenadine (ALLEGRA) 180 MG tablet, Take 180 mg by mouth daily. Reported on 12/28/2015, Disp: , Rfl:    fluticasone (FLONASE) 50 MCG/ACT nasal spray, Place 2 sprays into both nostrils daily., Disp: 16 g, Rfl: 6   hydrochlorothiazide (HYDRODIURIL) 25 MG tablet, Take 1 tablet (25 mg total) by mouth daily., Disp: 90 tablet, Rfl: 3   losartan (COZAAR) 50 MG tablet, Take 1 tablet (50 mg total) by mouth daily., Disp: 90 tablet, Rfl: 3   Multiple Vitamin (MULTIVITAMIN) tablet, Take 1 tablet by mouth daily., Disp: , Rfl:    potassium chloride (KLOR-CON M10) 10 MEQ tablet, TAKE 1 TABLET TWICE DAILY MON,WED,FRIDAY AND TAKE 1 TABLET DAILY ON TUE,THUR, SAT, SUN, Disp: 120 tablet, Rfl: 2   tamoxifen (NOLVADEX) 20 MG tablet, Take 20 mg by mouth daily., Disp: , Rfl:    Zoledronic Acid (ZOMETA IV), Inject into the vein., Disp: , Rfl:   Assessment/ Plan: 66 y.o. female   Vision changes - Plan: Ambulatory referral to Vascular Surgery  Jaw claudication - Plan: Ambulatory referral to Vascular Surgery  Stat referral to vascular for temporal artery biopsy. She has signs/ symptoms very suspicious for GCA.  Saw optometry and dental and both are in agreement that she is suspicious for this.  Inflammatory labs drawn  at optometrist office and she is on Prednisone '80mg'$  daily.    Start time: 12:08pm End time: 12:19pm  Total time spent on patient care (including telephone call/ virtual visit): 11 minutes  [place], [place] [phone]

## 2022-09-20 ENCOUNTER — Encounter (HOSPITAL_COMMUNITY): Payer: Self-pay | Admitting: Vascular Surgery

## 2022-09-20 ENCOUNTER — Other Ambulatory Visit: Payer: Self-pay

## 2022-09-20 ENCOUNTER — Ambulatory Visit: Payer: BC Managed Care – PPO | Admitting: Vascular Surgery

## 2022-09-20 ENCOUNTER — Encounter: Payer: Self-pay | Admitting: Vascular Surgery

## 2022-09-20 VITALS — BP 157/72 | HR 60 | Temp 98.0°F | Resp 20 | Ht 63.0 in | Wt 143.0 lb

## 2022-09-20 DIAGNOSIS — M316 Other giant cell arteritis: Secondary | ICD-10-CM | POA: Diagnosis not present

## 2022-09-20 NOTE — Progress Notes (Signed)
Mrs Fiser denies chest pain chest pain or shortness of breath.  Patient denies having any s/s of Covid in her household, also denies any known exposure to Covid.   Mrs Rowand's PCP is Dr. Ronnie Doss with [facility].

## 2022-09-20 NOTE — H&P (View-Only) (Signed)
ASSESSMENT & PLAN   POSSIBLE TEMPORAL ARTERITIS: This patient presents with a loss of vision in the right eye, headaches, and an elevated sed rate (47).  We have been asked to perform a temporal artery biopsy of the right.  She has a palpable right temporal pulse.  She appears to be a good candidate for a biopsy and and I will try to schedule this for tomorrow.  We have discussed the indications for the procedure and the potential complications and she is agreeable to proceed.  She is not on any blood thinners.  REASON FOR CONSULT:    To evaluate for a temporal artery biopsy.  The consult is requested by Dr. Lajuana Ripple.   HPI:   Cindy Blake is a 66 y.o. female who presents to be evaluated for temporal artery biopsy.  Approximately 5 days ago she developed some floaters in the right eye.  The following day the eye was cloudy.  By Monday, 3 days ago, she had lost the vision in her right eye.  She has been having some headaches mostly posteriorly but more so on the right side.  She is also had some jaw pain.  She has been evaluated by ENT because of some sinus issues but had no evidence of significant disease in the sinuses.  Given the concern for temporal arteritis she has been started on prednisone.  We have been asked to provide a temporal artery biopsy.  Her only risk factor for peripheral arterial disease is hypertension.  She denies any history of diabetes, hypercholesterolemia, family history of premature cardiovascular disease, or smoking history.  She denies any history of stroke, TIAs, expressive or receptive aphasia.  Past Medical History:  Diagnosis Date   Allergy    History of colon polyps    Hypertension    IBS (irritable bowel syndrome)    Mitral valve disorder     Family History  Problem Relation Age of Onset   Hypertension Father    Stroke Father    Heart attack Father    Arthritis Father     SOCIAL HISTORY: Social History   Tobacco Use   Smoking status:  Never   Smokeless tobacco: Never  Substance Use Topics   Alcohol use: Yes    Comment: rare    Allergies  Allergen Reactions   Shellfish-Derived Products Hives   Sulfonamide Derivatives     REACTION: hives, itching, swelling    Current Outpatient Medications  Medication Sig Dispense Refill   ALPRAZolam (XANAX) 1 MG tablet TAKE 1/2 TO 1 TABLET BID AS NEEDED FOR ANXIETY & SLEEP 45 tablet 5   aspirin 81 MG tablet Take 81 mg by mouth daily.     Calcium Carbonate (CALCIUM 600 PO) Take 1 tablet by mouth daily.     Cholecalciferol (VITAMIN D) 2000 UNITS CAPS Take 1 capsule by mouth daily.     fexofenadine (ALLEGRA) 180 MG tablet Take 180 mg by mouth daily. Reported on 12/28/2015     fluticasone (FLONASE) 50 MCG/ACT nasal spray Place 2 sprays into both nostrils daily. 16 g 6   hydrochlorothiazide (HYDRODIURIL) 25 MG tablet Take 1 tablet (25 mg total) by mouth daily. 90 tablet 3   losartan (COZAAR) 50 MG tablet Take 1 tablet (50 mg total) by mouth daily. 90 tablet 3   Multiple Vitamin (MULTIVITAMIN) tablet Take 1 tablet by mouth daily.     potassium chloride (KLOR-CON M10) 10 MEQ tablet TAKE 1 TABLET TWICE DAILY MON,WED,FRIDAY AND TAKE 1 TABLET  DAILY ON TUE,THUR, SAT, SUN 120 tablet 2   predniSONE (DELTASONE) 20 MG tablet Take 80 mg by mouth daily with breakfast.     tamoxifen (NOLVADEX) 20 MG tablet Take 20 mg by mouth daily.     Zoledronic Acid (ZOMETA IV) Inject into the vein.     No current facility-administered medications for this visit.    REVIEW OF SYSTEMS:  '[X]'$  denotes positive finding, '[ ]'$  denotes negative finding Cardiac  Comments:  Chest pain or chest pressure:    Shortness of breath upon exertion:    Short of breath when lying flat:    Irregular heart rhythm:        Vascular    Pain in calf, thigh, or hip brought on by ambulation:    Pain in feet at night that wakes you up from your sleep:     Blood clot in your veins:    Leg swelling:         Pulmonary    Oxygen at  home:    Productive cough:     Wheezing:         Neurologic    Sudden weakness in arms or legs:     Sudden numbness in arms or legs:     Sudden onset of difficulty speaking or slurred speech:    Temporary loss of vision in one eye:     Problems with dizziness:         Gastrointestinal    Blood in stool:     Vomited blood:         Genitourinary    Burning when urinating:     Blood in urine:        Psychiatric    Major depression:         Hematologic    Bleeding problems:    Problems with blood clotting too easily:        Skin    Rashes or ulcers:        Constitutional    Fever or chills:    -  PHYSICAL EXAM:   Vitals:   09/20/22 1014  BP: (!) 157/72  Pulse: 60  Resp: 20  Temp: 98 F (36.7 C)  SpO2: 97%  Weight: 143 lb (64.9 kg)  Height: '5\' 3"'$  (1.6 m)   Body mass index is 25.33 kg/m. GENERAL: The patient is a well-nourished female, in no acute distress. The vital signs are documented above. CARDIAC: There is a regular rate and rhythm.  VASCULAR: I do not detect carotid bruits. She does have a palpable right temporal pulse just anterior to the right ear. She has palpable dorsalis pedis pulses. PULMONARY: There is good air exchange bilaterally without wheezing or rales..  MUSCULOSKELETAL: There are no major deformities. NEUROLOGIC: No focal weakness or paresthesias are detected. SKIN: There are no ulcers or rashes noted. PSYCHIATRIC: The patient has a normal affect.  DATA:    LABS: Her sed rate is 47.  Upper limits of normal is 40.  Deitra Mayo [facility]

## 2022-09-20 NOTE — Addendum Note (Signed)
Addended by: Nicholas Lose on: 09/20/2022 11:23 AM   Modules accepted: Orders

## 2022-09-20 NOTE — Progress Notes (Signed)
ASSESSMENT & PLAN   POSSIBLE TEMPORAL ARTERITIS: This patient presents with a loss of vision in the right eye, headaches, and an elevated sed rate (47).  We have been asked to perform a temporal artery biopsy of the right.  She has a palpable right temporal pulse.  She appears to be a good candidate for a biopsy and and I will try to schedule this for tomorrow.  We have discussed the indications for the procedure and the potential complications and she is agreeable to proceed.  She is not on any blood thinners.  REASON FOR CONSULT:    To evaluate for a temporal artery biopsy.  The consult is requested by Dr. Lajuana Ripple.   HPI:   Cindy Blake is a 66 y.o. female who presents to be evaluated for temporal artery biopsy.  Approximately 5 days ago she developed some floaters in the right eye.  The following day the eye was cloudy.  By Monday, 3 days ago, she had lost the vision in her right eye.  She has been having some headaches mostly posteriorly but more so on the right side.  She is also had some jaw pain.  She has been evaluated by ENT because of some sinus issues but had no evidence of significant disease in the sinuses.  Given the concern for temporal arteritis she has been started on prednisone.  We have been asked to provide a temporal artery biopsy.  Her only risk factor for peripheral arterial disease is hypertension.  She denies any history of diabetes, hypercholesterolemia, family history of premature cardiovascular disease, or smoking history.  She denies any history of stroke, TIAs, expressive or receptive aphasia.  Past Medical History:  Diagnosis Date   Allergy    History of colon polyps    Hypertension    IBS (irritable bowel syndrome)    Mitral valve disorder     Family History  Problem Relation Age of Onset   Hypertension Father    Stroke Father    Heart attack Father    Arthritis Father     SOCIAL HISTORY: Social History   Tobacco Use   Smoking status:  Never   Smokeless tobacco: Never  Substance Use Topics   Alcohol use: Yes    Comment: rare    Allergies  Allergen Reactions   Shellfish-Derived Products Hives   Sulfonamide Derivatives     REACTION: hives, itching, swelling    Current Outpatient Medications  Medication Sig Dispense Refill   ALPRAZolam (XANAX) 1 MG tablet TAKE 1/2 TO 1 TABLET BID AS NEEDED FOR ANXIETY & SLEEP 45 tablet 5   aspirin 81 MG tablet Take 81 mg by mouth daily.     Calcium Carbonate (CALCIUM 600 PO) Take 1 tablet by mouth daily.     Cholecalciferol (VITAMIN D) 2000 UNITS CAPS Take 1 capsule by mouth daily.     fexofenadine (ALLEGRA) 180 MG tablet Take 180 mg by mouth daily. Reported on 12/28/2015     fluticasone (FLONASE) 50 MCG/ACT nasal spray Place 2 sprays into both nostrils daily. 16 g 6   hydrochlorothiazide (HYDRODIURIL) 25 MG tablet Take 1 tablet (25 mg total) by mouth daily. 90 tablet 3   losartan (COZAAR) 50 MG tablet Take 1 tablet (50 mg total) by mouth daily. 90 tablet 3   Multiple Vitamin (MULTIVITAMIN) tablet Take 1 tablet by mouth daily.     potassium chloride (KLOR-CON M10) 10 MEQ tablet TAKE 1 TABLET TWICE DAILY MON,WED,FRIDAY AND TAKE 1 TABLET  DAILY ON TUE,THUR, SAT, SUN 120 tablet 2   predniSONE (DELTASONE) 20 MG tablet Take 80 mg by mouth daily with breakfast.     tamoxifen (NOLVADEX) 20 MG tablet Take 20 mg by mouth daily.     Zoledronic Acid (ZOMETA IV) Inject into the vein.     No current facility-administered medications for this visit.    REVIEW OF SYSTEMS:  '[X]'$  denotes positive finding, '[ ]'$  denotes negative finding Cardiac  Comments:  Chest pain or chest pressure:    Shortness of breath upon exertion:    Short of breath when lying flat:    Irregular heart rhythm:        Vascular    Pain in calf, thigh, or hip brought on by ambulation:    Pain in feet at night that wakes you up from your sleep:     Blood clot in your veins:    Leg swelling:         Pulmonary    Oxygen at  home:    Productive cough:     Wheezing:         Neurologic    Sudden weakness in arms or legs:     Sudden numbness in arms or legs:     Sudden onset of difficulty speaking or slurred speech:    Temporary loss of vision in one eye:     Problems with dizziness:         Gastrointestinal    Blood in stool:     Vomited blood:         Genitourinary    Burning when urinating:     Blood in urine:        Psychiatric    Major depression:         Hematologic    Bleeding problems:    Problems with blood clotting too easily:        Skin    Rashes or ulcers:        Constitutional    Fever or chills:    -  PHYSICAL EXAM:   Vitals:   09/20/22 1014  BP: (!) 157/72  Pulse: 60  Resp: 20  Temp: 98 F (36.7 C)  SpO2: 97%  Weight: 143 lb (64.9 kg)  Height: '5\' 3"'$  (1.6 m)   Body mass index is 25.33 kg/m. GENERAL: The patient is a well-nourished female, in no acute distress. The vital signs are documented above. CARDIAC: There is a regular rate and rhythm.  VASCULAR: I do not detect carotid bruits. She does have a palpable right temporal pulse just anterior to the right ear. She has palpable dorsalis pedis pulses. PULMONARY: There is good air exchange bilaterally without wheezing or rales..  MUSCULOSKELETAL: There are no major deformities. NEUROLOGIC: No focal weakness or paresthesias are detected. SKIN: There are no ulcers or rashes noted. PSYCHIATRIC: The patient has a normal affect.  DATA:    LABS: Her sed rate is 47.  Upper limits of normal is 40.  Deitra Mayo [facility]

## 2022-09-21 ENCOUNTER — Other Ambulatory Visit: Payer: Self-pay

## 2022-09-21 ENCOUNTER — Ambulatory Visit (HOSPITAL_COMMUNITY): Payer: BC Managed Care – PPO | Admitting: Certified Registered Nurse Anesthetist

## 2022-09-21 ENCOUNTER — Encounter (HOSPITAL_COMMUNITY): Payer: Self-pay | Admitting: Vascular Surgery

## 2022-09-21 ENCOUNTER — Ambulatory Visit (HOSPITAL_COMMUNITY)
Admission: RE | Admit: 2022-09-21 | Discharge: 2022-09-21 | Disposition: A | Discharge status: Home / Self Care | Payer: BC Managed Care – PPO | Attending: Vascular Surgery | Admitting: Vascular Surgery

## 2022-09-21 ENCOUNTER — Encounter (HOSPITAL_COMMUNITY)
Admission: RE | Disposition: A | Discharge status: Home / Self Care | Payer: Self-pay | Source: Home / Self Care | Attending: Vascular Surgery

## 2022-09-21 DIAGNOSIS — M316 Other giant cell arteritis: Secondary | ICD-10-CM | POA: Diagnosis not present

## 2022-09-21 DIAGNOSIS — H5461 Unqualified visual loss, right eye, normal vision left eye: Secondary | ICD-10-CM | POA: Diagnosis not present

## 2022-09-21 DIAGNOSIS — Z79899 Other long term (current) drug therapy: Secondary | ICD-10-CM | POA: Diagnosis not present

## 2022-09-21 DIAGNOSIS — F419 Anxiety disorder, unspecified: Secondary | ICD-10-CM | POA: Diagnosis not present

## 2022-09-21 DIAGNOSIS — I10 Essential (primary) hypertension: Secondary | ICD-10-CM | POA: Insufficient documentation

## 2022-09-21 DIAGNOSIS — H538 Other visual disturbances: Secondary | ICD-10-CM | POA: Diagnosis not present

## 2022-09-21 HISTORY — DX: Nonrheumatic mitral (valve) prolapse: I34.1

## 2022-09-21 HISTORY — DX: Other specified postprocedural states: Z98.890

## 2022-09-21 HISTORY — DX: Other complications of anesthesia, initial encounter: T88.59XA

## 2022-09-21 HISTORY — DX: Malignant (primary) neoplasm, unspecified: C80.1

## 2022-09-21 HISTORY — DX: Nausea with vomiting, unspecified: R11.2

## 2022-09-21 HISTORY — DX: Family history of other specified conditions: Z84.89

## 2022-09-21 HISTORY — PX: ARTERY BIOPSY: SHX891

## 2022-09-21 LAB — CBC
HCT: 34.6 % — ABNORMAL LOW (ref 36.0–46.0)
Hemoglobin: 11.7 g/dL — ABNORMAL LOW (ref 12.0–15.0)
MCH: 30.2 pg (ref 26.0–34.0)
MCHC: 33.8 g/dL (ref 30.0–36.0)
MCV: 89.2 fL (ref 80.0–100.0)
Platelets: 370 10*3/uL (ref 150–400)
RBC: 3.88 MIL/uL (ref 3.87–5.11)
RDW: 12.9 % (ref 11.5–15.5)
WBC: 10.4 10*3/uL (ref 4.0–10.5)
nRBC: 0 % (ref 0.0–0.2)

## 2022-09-21 LAB — BASIC METABOLIC PANEL
Anion gap: 10 (ref 5–15)
BUN: 15 mg/dL (ref 8–23)
CO2: 29 mmol/L (ref 22–32)
Calcium: 8.2 mg/dL — ABNORMAL LOW (ref 8.9–10.3)
Chloride: 101 mmol/L (ref 98–111)
Creatinine, Ser: 0.91 mg/dL (ref 0.44–1.00)
GFR, Estimated: >60 mL/min (ref >60)
Glucose, Bld: 90 mg/dL (ref 70–99)
Potassium: 3 mmol/L — ABNORMAL LOW (ref 3.5–5.1)
Sodium: 140 mmol/L (ref 135–145)

## 2022-09-21 SURGERY — BIOPSY TEMPORAL ARTERY
Anesthesia: Monitor Anesthesia Care | Laterality: Right

## 2022-09-21 MED ORDER — 0.9 % SODIUM CHLORIDE (POUR BTL) OPTIME
TOPICAL | Status: DC | PRN
  Administered 2022-09-21: 1000 mL

## 2022-09-21 MED ORDER — LIDOCAINE HCL (PF) 1 % IJ SOLN
INTRAMUSCULAR | Status: AC
  Filled 2022-09-21: qty 30

## 2022-09-21 MED ORDER — MIDAZOLAM HCL 2 MG/2ML IJ SOLN
INTRAMUSCULAR | Status: DC | PRN
  Administered 2022-09-21 (×2): 1 mg via INTRAVENOUS

## 2022-09-21 MED ORDER — CHLORHEXIDINE GLUCONATE 4 % EX LIQD: 60.0000 mL | Freq: Once | CUTANEOUS | Status: DC

## 2022-09-21 MED ORDER — ONDANSETRON HCL 4 MG/2ML IJ SOLN
INTRAMUSCULAR | Status: DC | PRN
  Administered 2022-09-21: 4 mg via INTRAVENOUS

## 2022-09-21 MED ORDER — PROPOFOL 10 MG/ML IV BOLUS
INTRAVENOUS | Status: AC
  Filled 2022-09-21: qty 20

## 2022-09-21 MED ORDER — FENTANYL CITRATE (PF) 250 MCG/5ML IJ SOLN
INTRAMUSCULAR | Status: DC | PRN
  Administered 2022-09-21 (×2): 25 ug via INTRAVENOUS

## 2022-09-21 MED ORDER — CEFAZOLIN SODIUM-DEXTROSE 2-4 GM/100ML-% IV SOLN
2.0000 g | INTRAVENOUS | Status: AC
  Administered 2022-09-21: 2 g via INTRAVENOUS
  Filled 2022-09-21: qty 100

## 2022-09-21 MED ORDER — LACTATED RINGERS IV SOLN: INTRAVENOUS | Status: DC

## 2022-09-21 MED ORDER — PHENYLEPHRINE HCL-NACL 20-0.9 MG/250ML-% IV SOLN
INTRAVENOUS | Status: DC | PRN
  Administered 2022-09-21: 40 ug/min via INTRAVENOUS

## 2022-09-21 MED ORDER — MIDAZOLAM HCL 2 MG/2ML IJ SOLN
INTRAMUSCULAR | Status: AC
  Filled 2022-09-21: qty 2

## 2022-09-21 MED ORDER — TRAMADOL HCL 50 MG PO TABS: 50.0000 mg | ORAL_TABLET | Freq: Four times a day (QID) | ORAL | 0 refills | Status: DC | PRN

## 2022-09-21 MED ORDER — FENTANYL CITRATE (PF) 250 MCG/5ML IJ SOLN
INTRAMUSCULAR | Status: AC
  Filled 2022-09-21: qty 5

## 2022-09-21 MED ORDER — LIDOCAINE HCL (PF) 1 % IJ SOLN
INTRAMUSCULAR | Status: DC | PRN
  Administered 2022-09-21: 4 mL

## 2022-09-21 MED ORDER — PROPOFOL 10 MG/ML IV BOLUS
INTRAVENOUS | Status: DC | PRN
  Administered 2022-09-21: 10 mg via INTRAVENOUS
  Administered 2022-09-21: 20 mg via INTRAVENOUS

## 2022-09-21 MED ORDER — ORAL CARE MOUTH RINSE: 15.0000 mL | Freq: Once | OROMUCOSAL | Status: AC

## 2022-09-21 MED ORDER — CHLORHEXIDINE GLUCONATE 0.12 % MT SOLN
15.0000 mL | Freq: Once | OROMUCOSAL | Status: AC
  Administered 2022-09-21: 15 mL via OROMUCOSAL
  Filled 2022-09-21: qty 15

## 2022-09-21 MED ORDER — PROPOFOL 500 MG/50ML IV EMUL
INTRAVENOUS | Status: DC | PRN
  Administered 2022-09-21: 125 ug/kg/min via INTRAVENOUS

## 2022-09-21 MED ORDER — PHENYLEPHRINE 80 MCG/ML (10ML) SYRINGE FOR IV PUSH (FOR BLOOD PRESSURE SUPPORT)
PREFILLED_SYRINGE | INTRAVENOUS | Status: DC | PRN
  Administered 2022-09-21: 160 ug via INTRAVENOUS
  Administered 2022-09-21 (×2): 80 ug via INTRAVENOUS
  Administered 2022-09-21: 160 ug via INTRAVENOUS

## 2022-09-21 SURGICAL SUPPLY — 43 items
BAG COUNTER SPONGE SURGICOUNT (BAG) ×1 IMPLANT
CANISTER SUCT 3000ML PPV (MISCELLANEOUS) ×1 IMPLANT
CLIP LIGATING EXTRA MED SLVR (CLIP) ×1 IMPLANT
CLIP LIGATING EXTRA SM BLUE (MISCELLANEOUS) ×1 IMPLANT
CNTNR URN SCR LID CUP LEK RST (MISCELLANEOUS) ×1 IMPLANT
CONT SPEC 4OZ STRL OR WHT (MISCELLANEOUS) ×1
COTTONBALL LRG STERILE PKG (GAUZE/BANDAGES/DRESSINGS) ×1 IMPLANT
COVER SURGICAL LIGHT HANDLE (MISCELLANEOUS) ×1 IMPLANT
DERMABOND ADVANCED .7 DNX12 (GAUZE/BANDAGES/DRESSINGS) ×1 IMPLANT
DERMABOND ADVANCED .7 DNX6 (GAUZE/BANDAGES/DRESSINGS) IMPLANT
DRAPE OPHTHALMIC 77X100 STRL (CUSTOM PROCEDURE TRAY) ×1 IMPLANT
ELECT NDL BLADE 2-5/6 (NEEDLE) ×1 IMPLANT
ELECT NEEDLE BLADE 2-5/6 (NEEDLE) ×1 IMPLANT
ELECT REM PT RETURN 9FT ADLT (ELECTROSURGICAL) ×1
ELECTRODE REM PT RTRN 9FT ADLT (ELECTROSURGICAL) ×1 IMPLANT
GAUZE 4X4 16PLY ~~LOC~~+RFID DBL (SPONGE) ×1 IMPLANT
GEL ULTRASOUND 8.5O AQUASONIC (MISCELLANEOUS) ×1 IMPLANT
GLOVE BIO SURGEON STRL SZ7.5 (GLOVE) ×1 IMPLANT
GOWN STRL REUS W/ TWL LRG LVL3 (GOWN DISPOSABLE) ×1 IMPLANT
GOWN STRL REUS W/ TWL XL LVL3 (GOWN DISPOSABLE) ×1 IMPLANT
GOWN STRL REUS W/TWL LRG LVL3 (GOWN DISPOSABLE) ×1
GOWN STRL REUS W/TWL XL LVL3 (GOWN DISPOSABLE) ×1
KIT BASIN OR (CUSTOM PROCEDURE TRAY) ×1 IMPLANT
KIT TURNOVER KIT B (KITS) ×1 IMPLANT
LOOP VESSEL MINI RED (MISCELLANEOUS) ×1 IMPLANT
NDL HYPO 25GX1X1/2 BEV (NEEDLE) ×1 IMPLANT
NEEDLE HYPO 25GX1X1/2 BEV (NEEDLE) ×1 IMPLANT
NS IRRIG 1000ML POUR BTL (IV SOLUTION) ×1 IMPLANT
PACK GENERAL/GYN (CUSTOM PROCEDURE TRAY) ×1 IMPLANT
PAD ARMBOARD 7.5X6 YLW CONV (MISCELLANEOUS) ×2 IMPLANT
POWDER SURGICEL 3.0 GRAM (HEMOSTASIS) IMPLANT
SPIKE FLUID TRANSFER (MISCELLANEOUS) ×1 IMPLANT
SUCTION FRAZIER HANDLE 10FR (MISCELLANEOUS) ×1
SUCTION TUBE FRAZIER 10FR DISP (MISCELLANEOUS) ×1 IMPLANT
SUT MNCRL AB 4-0 PS2 18 (SUTURE) ×1 IMPLANT
SUT PROLENE 6 0 BV (SUTURE) IMPLANT
SUT SILK 3 0 (SUTURE)
SUT SILK 3-0 18XBRD TIE 12 (SUTURE) IMPLANT
SUT VIC AB 3-0 SH 27 (SUTURE) ×1
SUT VIC AB 3-0 SH 27X BRD (SUTURE) ×1 IMPLANT
SYR CONTROL 10ML LL (SYRINGE) ×1 IMPLANT
TOWEL GREEN STERILE (TOWEL DISPOSABLE) ×1 IMPLANT
WATER STERILE IRR 1000ML POUR (IV SOLUTION) ×1 IMPLANT

## 2022-09-21 NOTE — Op Note (Signed)
    Patient name: Cindy Blake MRN: [id number] DOB: January 07, 1956 Sex: female  09/21/2022 Pre-operative Diagnosis: Right eye vision loss with concern for giant cell arteritis Post-operative diagnosis:  Same Surgeon:  Eda Paschal. Donzetta Matters, MD Procedure Performed:  Right temporal artery biopsy  Indications: 66 year old female with recent vision loss in the right eye with concern for giant cell arteritis now indicated for temporal artery biopsy.  Findings: There was inflammation around the temporal artery which can be indicative of temporal arteritis.  2 segments totaling approximately 5 cm of the temporal artery were sent as specimen.   Procedure:  The patient was identified in the holding area and taken to the operating room where she was placed upon operative table and MAC anesthesia was induced.  She was sterilely prepped and draped in the right side of her face and head in the usual fashion, antibiotics were minister timeout was called.  Doppler was used to identify the temporal artery and the hair around this area was minimally shaved with 10 blade and removed.  4 cm incision was created I dissected down through the subcutaneous tissue and fascia identified the temporal vein and the temporal artery.  Traced this cephalad and doubly clipped at and transected it.  I did have to transect the second segment.  There was arterial bleeding antegrade.  Approximately tied off and removed both segments and sent them a specimen.  The wound was irrigated and hemostasis obtained and I closed the skin with 4 Monocryl and Dermabond is placed at the skin level.  She was awakened from anesthesia having tolerated procedure without any complication.  All counts were correct at completion.  EBL: 10 cc    Camelle Henkels C. Donzetta Matters, MD [facility] [place] Office: [phone] Pager: [phone]

## 2022-09-21 NOTE — Interval H&P Note (Signed)
History and Physical Interval Note:  09/21/2022 3:10 PM  Cindy Blake  has presented today for surgery, with the diagnosis of Temporal arteritis.  The various methods of treatment have been discussed with the patient and family. After consideration of risks, benefits and other options for treatment, the patient has consented to  Procedure(s): BIOPSY TEMPORAL ARTERY (Right) as a surgical intervention.  The patient's history has been reviewed, patient examined, no change in status, stable for surgery.  I have reviewed the patient's chart and labs.  Questions were answered to the patient's satisfaction.     [name]

## 2022-09-21 NOTE — Anesthesia Postprocedure Evaluation (Signed)
Anesthesia Post Note  Patient: Cindy Blake  Procedure(s) Performed: Right BIOPSY TEMPORAL ARTERY (Right)     Patient location during evaluation: PACU Anesthesia Type: MAC Level of consciousness: awake and alert Pain management: pain level controlled Vital Signs Assessment: post-procedure vital signs reviewed and stable Respiratory status: spontaneous breathing, nonlabored ventilation and respiratory function stable Cardiovascular status: stable and blood pressure returned to baseline Anesthetic complications: no   No notable events documented.  Last Vitals:  Vitals:   09/21/22 1608 09/21/22 1625  BP: (!) 133/52 (!) 145/56  Pulse: (!) 56 (!) 55  Resp: 15 17  Temp: 36.6 C 36.6 C  SpO2: 94% 93%    Last Pain:  Vitals:   09/21/22 1625  PainSc: 0-No pain                 Audry Pili

## 2022-09-21 NOTE — Transfer of Care (Signed)
Immediate Anesthesia Transfer of Care Note  Patient: Cindy Blake  Procedure(s) Performed: Right BIOPSY TEMPORAL ARTERY (Right)  Patient Location: PACU  Anesthesia Type:MAC  Level of Consciousness: awake, alert  and patient cooperative  Airway & Oxygen Therapy: Patient Spontanous Breathing and Patient connected to face mask oxygen  Post-op Assessment: Report given to RN and Post -op Vital signs reviewed and stable  Post vital signs: Reviewed and stable  Last Vitals:  Vitals Value Taken Time  BP 133/52 09/21/22 1608  Temp 36.6 C 09/21/22 1608  Pulse 58 09/21/22 1610  Resp 15 09/21/22 1610  SpO2 95 % 09/21/22 1610  Vitals shown include unvalidated device data.  Last Pain:  Vitals:   09/21/22 1207  PainSc: 0-No pain      Patients Stated Pain Goal: 0 (80/32/12 2482)  Complications: No notable events documented.

## 2022-09-21 NOTE — Anesthesia Procedure Notes (Signed)
Procedure Name: General with mask airway Date/Time: 09/21/2022 3:32 PM  Performed by: Erick Colace, CRNAPre-anesthesia Checklist: Patient identified, Emergency Drugs available, Suction available and Patient being monitored Patient Re-evaluated:Patient Re-evaluated prior to induction Oxygen Delivery Method: Simple face mask Preoxygenation: Pre-oxygenation with 100% oxygen Induction Type: IV induction Dental Injury: Teeth and Oropharynx as per pre-operative assessment

## 2022-09-21 NOTE — Anesthesia Preprocedure Evaluation (Addendum)
Anesthesia Evaluation  Patient identified by MRN, date of birth, ID band Patient awake    Reviewed: Allergy & Precautions, NPO status , Patient's Chart, lab work & pertinent test results  History of Anesthesia Complications (+) PONV and history of anesthetic complications  Airway Mallampati: III     Mouth opening: Limited Mouth Opening  Dental  (+) Teeth Intact, Dental Advisory Given   Pulmonary neg pulmonary ROS,    breath sounds clear to auscultation       Cardiovascular hypertension, Pt. on medications  Rhythm:Regular Rate:Normal     Neuro/Psych PSYCHIATRIC DISORDERS Anxiety negative neurological ROS     GI/Hepatic negative GI ROS, Neg liver ROS,   Endo/Other  negative endocrine ROS  Renal/GU negative Renal ROS     Musculoskeletal negative musculoskeletal ROS (+)   Abdominal Normal abdominal exam  (+)   Peds  Hematology negative hematology ROS (+)   Anesthesia Other Findings   Reproductive/Obstetrics                           Anesthesia Physical Anesthesia Plan  ASA: 3  Anesthesia Plan: MAC   Post-op Pain Management:    Induction: Intravenous  PONV Risk Score and Plan: Propofol infusion and Ondansetron  Airway Management Planned: Simple Face Mask and Natural Airway  Additional Equipment: None  Intra-op Plan:   Post-operative Plan:   Informed Consent: I have reviewed the patients History and Physical, chart, labs and discussed the procedure including the risks, benefits and alternatives for the proposed anesthesia with the patient or authorized representative who has indicated his/her understanding and acceptance.     Dental advisory given  Plan Discussed with: CRNA  Anesthesia Plan Comments:        Anesthesia Quick Evaluation

## 2022-09-22 ENCOUNTER — Encounter (HOSPITAL_COMMUNITY): Payer: Self-pay | Admitting: Vascular Surgery

## 2022-09-24 ENCOUNTER — Other Ambulatory Visit: Payer: Self-pay | Admitting: Family Medicine

## 2022-09-24 DIAGNOSIS — M316 Other giant cell arteritis: Secondary | ICD-10-CM

## 2022-09-24 LAB — SURGICAL PATHOLOGY

## 2022-09-26 ENCOUNTER — Ambulatory Visit: Payer: BC Managed Care – PPO | Attending: Rheumatology | Admitting: Rheumatology

## 2022-09-26 ENCOUNTER — Encounter: Payer: Self-pay | Admitting: Rheumatology

## 2022-09-26 ENCOUNTER — Telehealth: Payer: Self-pay | Admitting: Pharmacist

## 2022-09-26 VITALS — BP 148/70 | HR 60 | Resp 16 | Ht 63.0 in | Wt 144.6 lb

## 2022-09-26 DIAGNOSIS — M316 Other giant cell arteritis: Secondary | ICD-10-CM

## 2022-09-26 DIAGNOSIS — Z170 Estrogen receptor positive status [ER+]: Secondary | ICD-10-CM

## 2022-09-26 DIAGNOSIS — Z7952 Long term (current) use of systemic steroids: Secondary | ICD-10-CM

## 2022-09-26 DIAGNOSIS — E7800 Pure hypercholesterolemia, unspecified: Secondary | ICD-10-CM

## 2022-09-26 DIAGNOSIS — M810 Age-related osteoporosis without current pathological fracture: Secondary | ICD-10-CM | POA: Diagnosis not present

## 2022-09-26 DIAGNOSIS — I10 Essential (primary) hypertension: Secondary | ICD-10-CM

## 2022-09-26 DIAGNOSIS — G453 Amaurosis fugax: Secondary | ICD-10-CM

## 2022-09-26 DIAGNOSIS — E559 Vitamin D deficiency, unspecified: Secondary | ICD-10-CM

## 2022-09-26 DIAGNOSIS — Z79899 Other long term (current) drug therapy: Secondary | ICD-10-CM

## 2022-09-26 DIAGNOSIS — C50212 Malignant neoplasm of upper-inner quadrant of left female breast: Secondary | ICD-10-CM

## 2022-09-26 DIAGNOSIS — Z1159 Encounter for screening for other viral diseases: Secondary | ICD-10-CM | POA: Diagnosis not present

## 2022-09-26 DIAGNOSIS — F411 Generalized anxiety disorder: Secondary | ICD-10-CM

## 2022-09-26 NOTE — Patient Instructions (Addendum)
Tocilizumab Injection What is this medication? TOCILIZUMAB (TOE si LIZ ue mab) treats autoimmune conditions, such as arthritis. It works by slowing down an overactive immune system. It may also be used to treat severe COVID-19 in people who are hospitalized. It is a monoclonal antibody. This medicine may be used for other purposes; ask your health care provider or pharmacist if you have questions. COMMON BRAND NAME(S): Actemra What should I tell my care team before I take this medication? They need to know if you have any of these conditions: Cancer Diabetes Heart disease History of or current hepatitis B infection High blood pressure High cholesterol Immune system problems Infection Liver disease Low blood counts, such as low white cell, platelet, or red cell counts Multiple sclerosis Recent or upcoming vaccine Stomach or intestine problems Stroke An unusual or allergic reaction to tocilizumab, other medications, foods, dyes, or preservatives Pregnant or trying to get pregnant Breast-feeding How should I use this medication? This medication is injected into a vein or under the skin. It may be given by your care team in a hospital or clinic setting. It may also be given at home. If you get this medication at home, you will be taught how to prepare and give it. Use it exactly as directed. Take it as directed on the prescription label. Keep taking it unless your care team tells you to stop. If you use a pen, be sure to take off the outer needle cover before using the dose. It is important that you put your used needles and syringes in a special sharps container. Do not put them in a trash can. If you do not have a sharps container, call your pharmacist or care team to get one. A special MedGuide will be given to you by the pharmacist with each prescription and refill. Be sure to read this information carefully each time. Talk to your care team about the use of this medication in children.  While it may be prescribed for children as young as 2 years for selected conditions, precautions do apply. Overdosage: If you think you have taken too much of this medicine contact a poison control center or emergency room at once. NOTE: This medicine is only for you. Do not share this medicine with others. What if I miss a dose? If you get this medication at the hospital or clinic: It is important not to miss your dose. Call your care team if you are unable to keep an appointment. If you give yourself this medication at home: If you miss a dose, take it as soon as you can. If it is almost time for your next dose, take only that dose. Do not take double or extra doses. Call your care team with questions. What may interact with this medication? Do not take this medication with any of the following: Live virus vaccines This medication may also interact with the following: Biologic medications, such as abatacept, adalimumab, anakinra, certolizumab, etanercept, golimumab, infliximab, rituximab, secukinumab, ustekinumab Certain medications for cholesterol, such as atorvastatin, lovastatin, simvastatin Cyclosporine Estrogen or progestin hormones Omeprazole Steroid medications, such as prednisone or cortisone Theophylline Vaccines Warfarin This list may not describe all possible interactions. Give your health care provider a list of all the medicines, herbs, non-prescription drugs, or dietary supplements you use. Also tell them if you smoke, drink alcohol, or use illegal drugs. Some items may interact with your medicine. What should I watch for while using this medication? Visit your care team for regular checks on  your progress. Your condition will be monitored carefully while you are receiving this medication. Tell your care team if your symptoms do not start to get better or if they get worse. You may need blood work done while taking this medication. You will be tested for tuberculosis (TB) before  you start this medication. If your care team prescribes any medication for TB, you should start taking the TB medication before starting this medication. Make sure to finish the full course of TB medication. This medication may increase your risk of getting an infection. Call your care team for advice if you get a fever, chills, sore throat, or other symptoms of a cold or flu. Do not treat yourself. Try to avoid being around people who are sick. Talk to your care team about your risk of cancer. You may be more at risk for certain types of cancers if you take this medication. What side effects may I notice from receiving this medication? Side effects that you should report to your care team as soon as possible: Allergic reactions--skin rash, itching, hives, swelling of the face, lips, tongue, or throat Infection--fever, chills, cough, sore throat, wounds that don't heal, pain or trouble when passing urine, general feeling of discomfort or being unwell Liver injury--right upper belly pain, loss of appetite, nausea, light-colored stool, dark yellow or brown urine, yellowing skin or eyes, unusual weakness or fatigue Stomach pain that is severe, does not go away, or gets worse Unusual bruising or bleeding Side effects that usually do not require medical attention (report to your care team if they continue or are bothersome): Dizziness Headache Increase in blood pressure Pain, redness, or irritation at injection site Runny or stuffy nose Sore throat Stomach pain This list may not describe all possible side effects. Call your doctor for medical advice about side effects. You may report side effects to FDA at 1-800-FDA-1088. Where should I keep my medication? Keep out of the reach of children and pets. You will be instructed on how to store this medication. Get rid of any unused medication after the expiration date on the label. To get rid of medications that are no longer needed or have expired: Take  the medication to a medication take-back program. Check with your pharmacy or law enforcement to find a location. If you cannot return the medication, ask your pharmacist or care team how to get rid of this medication safely. NOTE: This sheet is a summary. It may not cover all possible information. If you have questions about this medicine, talk to your doctor, pharmacist, or health care provider.  2023 Elsevier/Gold Standard (2022-02-08 00:00:00)  Standing Labs We placed an order today for your standing lab work.   Please have your standing labs drawn in 1 month and every 3 months  Please have your labs drawn 2 weeks prior to your appointment so that the provider can discuss your lab results at your appointment.  Please note that you may see your imaging and lab results in [facility] before we have reviewed them. We will contact you once all results are reviewed. Please allow our office up to 72 hours to thoroughly review all of the results before contacting the office for clarification of your results.  Lab hours are: Monday through Thursday from 1:30 pm-4:30 pm and Friday from 1:30 pm- 4:00 pm  You may experience shorter wait times on Monday, Thursday or Friday afternoons,.   Effective October 08, 2022, new lab hours will be: Monday through Thursday from 8:00  am -12:30 pm and 1:00 pm-5:00 pm and Friday from 8:00 am-12:00 pm.  Please be advised, all patients with office appointments requiring lab work will take precedent over walk-in lab work.   Labs are drawn by [facility]. Please bring your co-pay at the time of your lab draw.  You may receive a bill from [facility] for your lab work.  Please note if you are on Hydroxychloroquine and and an order has been placed for a Hydroxychloroquine level, you will need to have it drawn 4 hours or more after your last dose.  If you wish to have your labs drawn at another location, please call the office 24 hours in advance so we can fax the orders.  The  office is located at [address], [place], [place], [place] [address] No appointment is necessary.    If you have any questions regarding directions or hours of operation,  please call [phone].   As a reminder, please drink plenty of water prior to coming for your lab work. Thanks!   Vaccines You are taking a medication(s) that can suppress your immune system.  The following immunizations are recommended: Flu annually Covid-19  Td/Tdap (tetanus, diphtheria, pertussis) every 10 years Pneumonia (Prevnar 15 then Pneumovax 23 at least 1 year apart.  Alternatively, can take Prevnar 20 without needing additional dose) Shingrix: 2 doses from 4 weeks to 6 months apart  Please check with your PCP to make sure you are up to date.   If you have signs or symptoms of an infection or start antibiotics: First, call your PCP for workup of your infection. Hold your medication through the infection, until you complete your antibiotics, and until symptoms resolve if you take the following: Injectable medication (Actemra, Benlysta, Cimzia, Cosentyx, Enbrel, Humira, Kevzara, Orencia, Remicade, Simponi, Stelara, Taltz, Tremfya) Methotrexate Leflunomide (Arava) Mycophenolate (Cellcept) Morrie Sheldon, Olumiant, or Rinvoq    Prednisone dose: Please take prednisone 20 mg 2 tablets a.m. and 2 tablets noon for 1 week Prednisone 20 mg 2 tablets a.m. and 1-1/2 tablet noon for the next week We will discuss further dosing at the follow-up visit.

## 2022-09-26 NOTE — Progress Notes (Signed)
Pharmacy Note Subjective: Patient presents today to the [facility] for follow up office visit. Patient seen by the pharmacist for counseling on Actemra for temporal arteritis  History of hyperlipidemia: Yes  History of diverticulitis: No  Objective: CBC    Component Value Date/Time   WBC 10.4 09/21/2022 1126   RBC 3.88 09/21/2022 1126   HGB 11.7 (L) 09/21/2022 1126   HGB 12.6 04/09/2022 1610   HCT 34.6 (L) 09/21/2022 1126   HCT 36.6 04/09/2022 1610   PLT 370 09/21/2022 1126   PLT 252 04/09/2022 1610   MCV 89.2 09/21/2022 1126   MCV 89 04/09/2022 1610   MCH 30.2 09/21/2022 1126   MCHC 33.8 09/21/2022 1126   RDW 12.9 09/21/2022 1126   RDW 12.2 04/09/2022 1610   LYMPHSABS 1.8 07/06/2020 1528   EOSABS 0.2 07/06/2020 1528   BASOSABS 0.1 07/06/2020 1528    CMP     Component Value Date/Time   NA 140 09/21/2022 1126   NA 142 04/09/2022 1610   K 3.0 (L) 09/21/2022 1126   CL 101 09/21/2022 1126   CO2 29 09/21/2022 1126   GLUCOSE 90 09/21/2022 1126   BUN 15 09/21/2022 1126   BUN 10 04/09/2022 1610   CREATININE 0.91 09/21/2022 1126   CREATININE 0.75 05/15/2013 1029   CALCIUM 8.2 (L) 09/21/2022 1126   PROT 6.2 04/09/2022 1610   ALBUMIN 4.2 04/09/2022 1610   AST 15 04/09/2022 1610   ALT 12 04/09/2022 1610   ALKPHOS 59 04/09/2022 1610   BILITOT 0.3 04/09/2022 1610   GFRNONAA >60 09/21/2022 1126   GFRNONAA 89 05/15/2013 1029   GFRAA 99 07/06/2020 1528   GFRAA >89 05/15/2013 1029     Baseline Immunosuppressant Therapy Labs        No results found for: "HIV"        Latest Ref Rng & Units 04/09/2022    4:10 PM  Serum Protein Electrophoresis  Total Protein 6.0 - 8.5 g/dL 6.2     No results found for: "G6PDH"  No results found for: "TPMT"   Lipid Panel Lab Results  Component Value Date   CHOL 191 04/09/2022   HDL 80 04/09/2022   LDLCALC 95 04/09/2022   TRIG 87 04/09/2022   CHOLHDL 2.4 04/09/2022     Chest x-ray: 02/07/18-1. No acute  cardiopulmonary disease. 2. No evidence of a pulmonary nodule.  Assessment/Plan:  Counseled patient that Actemra is an IL-6 blocking agent.  Counseled patient on purpose, proper use, and adverse effects of Actemra.  Reviewed the most common adverse effects including infections, infusion reactions, bowel injury, and rarely cancer and conditions of the nervous system.  Reviewed risk of lipid abnormalities and elevated liver enzymes. Discussed that there is the possibility of an increased risk of malignancy but it is not well understood if this increased risk is due to the medication or the disease state. Counseled patient that Actemra should be held prior to scheduled surgery for infections or new antibiotic use. Counseled patient that Actemra should be held prior to scheduled surgery. Recommend annual influenza, PCV 15 or PCV20 or Pneumovax 23, and Shingrix as indicated.  Reviewed the importance of regular labs while on Actemra therapy including the need for routine lipid panel.  Will recheck lipid panel after 4-8 weeks of starting Actemra, then every 6 months routinely. CBC and CMP will be monitored every 3 months. TB gold will be monitored annually. Standing orders placed.  Provided patient with medication education material and answered all  questions.  Patient voiced understanding.  Patient consented to Actemra.  Will upload consent into the media tab.  Reviewed storage instructions of Actemra with patient.  Advised patient that initial Actemra injection must be given in the office.  Will apply for Actemra through patient's insurance for self-administration using auto-injector.  Patient dose will be 162 mg every 7 days for [facility].  Prescription pending lab results and/or insurance approval.  She will stay on prednisone 22mdaily for now. Once she starts Actemra, she will start tapering down prednisone dose by '10mg'$  after one week until f/u visit  DKnox Saliva PharmD, MPH, BCPS, CPP Clinical Pharmacist  (Rheumatology and Pulmonology)

## 2022-09-26 NOTE — Telephone Encounter (Signed)
Submitted a Prior Authorization request to [facility] for ACTEMRA via CoverMyMeds. Will update once we receive a response.  Key: [id number]  Knox Saliva, PharmD, MPH, BCPS, CPP Clinical Pharmacist (Rheumatology and Pulmonology)

## 2022-09-26 NOTE — Telephone Encounter (Signed)
Please start Actemra Actpen SQ BIV. Submit as urgent  Dose: '162mg'$  SQ every 7 days  Dx: Giant cell arteritis  Current regimen: prednisone '80mg'$  daily  Patient is currently working and has PPO insurance. She confirmed that she is not planning on retiring anytime soon. Must be enrolled into copay card once approved  Knox Saliva, PharmD, MPH, BCPS, CPP Clinical Pharmacist (Rheumatology and Pulmonology)

## 2022-09-26 NOTE — Progress Notes (Signed)
Office Visit Note  Patient: Cindy Blake             Date of Birth: 04-14-1956           MRN: [id number]             PCP: Janora Norlander, DO Referring: Janora Norlander, DO Visit Date: 09/26/2022 Occupation: '@GUAROCC'$ @  Subjective:  Temporal arteritis and right eye vision loss  History of Present Illness: Cindy Blake is a 66 y.o. female seen in consultation per request of her PCP.  According to the patient her symptoms a started in mid August 2023.  She states she had a Zometa IV infusion after that she developed sinus infection and went to her PCP.  She was given Augmentin for 10 days without resolution of her symptoms.  She started having headaches which she describes parietal and temporal headache along with the neck a stiffness and jaw pain.  She was given another course of antibiotics and placed on prednisone for 6 days.  She states she contacted her oncologist who referred her to ENT physician who did the ear examination which was normal.  He also did CT scan of the sinuses which was normal per patient.  She was advised to see a TMJ dentist.  She was evaluated by the dentist who felt there was some wear-and-tear in the TMJ joints.  On September 15, 2022 she noticed floaters in her right eye and the following day she noticed cloudy vision.  October 9 she noticed complete loss of vision in her right eye and the headache resolved.  She was seen an ophthalmologist with [facility] who suspected temporal arteritis and placed her on prednisone 80 mg p.o. daily which she started on October 10.  She still has complete loss of vision in her right eye.  She states her light left eye feels blurred.  She was evaluated by Dr. Doren Custard on October 13 and had temporal artery biopsy on the right side.  The biopsy results came positive for temporal arteritis.  She denies any muscle weakness or tenderness.  There is no family history of autoimmune disease.  He is gravida 1, para 1, miscarriages 0.  History of  DVTs.  Activities of Daily Living:  Patient reports morning stiffness for 0 minutes.   Patient Denies nocturnal pain.  Difficulty dressing/grooming: Denies Difficulty climbing stairs: Denies Difficulty getting out of chair: Denies Difficulty using hands for taps, buttons, cutlery, and/or writing: Denies  Review of Systems  Constitutional:  Positive for fatigue.  HENT:  Positive for mouth dryness. Negative for mouth sores.   Eyes:  Positive for photophobia and visual disturbance. Negative for dryness.  Respiratory:  Negative for shortness of breath.   Cardiovascular:  Negative for chest pain and palpitations.  Gastrointestinal:  Positive for constipation and diarrhea. Negative for blood in stool.  Endocrine: Negative for increased urination.  Genitourinary:  Negative for involuntary urination.  Musculoskeletal:  Negative for joint pain, gait problem, joint pain, joint swelling, myalgias, muscle weakness, morning stiffness, muscle tenderness and myalgias.  Skin:  Positive for sensitivity to sunlight. Negative for color change, rash and hair loss.  Allergic/Immunologic: Negative for susceptible to infections.  Neurological:  Negative for dizziness and headaches.  Hematological:  Negative for swollen glands.  Psychiatric/Behavioral:  Positive for sleep disturbance. Negative for depressed mood. The patient is not nervous/anxious.     PMFS History:  Patient Active Problem List   Diagnosis Date Noted   Osteoporosis 12/08/2019  Malignant neoplasm of left female breast ([place]) 05/01/2016   Cancer of upper-inner quadrant of female breast ([place]) 02/01/2015   Generalized anxiety disorder 11/10/2013   Allergic rhinitis 11/10/2013   Hyperlipemia 07/06/2013   Hypertension 07/06/2013   Vitamin D deficiency 07/06/2013    Past Medical History:  Diagnosis Date   Allergy    Cancer ([place])    Right Breast   Complication of anesthesia    Family history of adverse reaction to anesthesia    nausea    History of colon polyps    Hypertension    IBS (irritable bowel syndrome)    Mitral valve disorder    MVP (mitral valve prolapse)    PONV (postoperative nausea and vomiting)     Family History  Problem Relation Age of Onset   Cancer Mother    Hypertension Father    Stroke Father    Heart attack Father    Arthritis Father    Hypertension Sister    Hypertension Sister    Lung cancer Brother    Multiple sclerosis Brother    Healthy Daughter    Past Surgical History:  Procedure Laterality Date   ABDOMINAL HYSTERECTOMY  age 42   APPENDECTOMY     ARTERY BIOPSY Right 09/21/2022   Procedure: Right BIOPSY TEMPORAL ARTERY;  Surgeon: Waynetta Sandy, MD;  Location: [facility];  Service: Vascular;  Laterality: Right;   CHOLECYSTECTOMY     LASIK  2000   SHOULDER SURGERY Right    bone spur   Social History   Social History Narrative   Not on file   Immunization History  Administered Date(s) Administered   Influenza Inj Mdck Quad With Preservative 09/19/2020   Influenza,inj,Quad PF,6+ Mos 10/11/2015, 09/03/2016, 09/26/2017, 10/08/2018, 10/05/2019   Influenza-Unspecified 09/22/2020, 09/27/2021   PFIZER(Purple Top)SARS-COV-2 Vaccination 02/14/2020, 03/06/2020, 09/30/2020, 09/08/2021   Pneumococcal Conjugate-13 01/09/2017   Pneumococcal Polysaccharide-23 07/07/2021   Tdap 06/05/2018   Zoster Recombinat (Shingrix) 07/07/2021, 10/10/2021     Objective: Vital Signs: BP (!) 148/70 (BP Location: Right Arm, Patient Position: Sitting, Cuff Size: Normal)   Pulse 60   Resp 16   Ht '5\' 3"'$  (1.6 m)   Wt 144 lb 9.6 oz (65.6 kg)   BMI 25.61 kg/m    Physical Exam Vitals and nursing note reviewed.  Constitutional:      Appearance: She is well-developed.  HENT:     Head: Normocephalic and atraumatic.  Eyes:     Conjunctiva/sclera: Conjunctivae normal.  Cardiovascular:     Rate and Rhythm: Normal rate and regular rhythm.     Heart sounds: Normal heart sounds.  Pulmonary:      Effort: Pulmonary effort is normal.     Breath sounds: Normal breath sounds.  Abdominal:     General: Bowel sounds are normal.     Palpations: Abdomen is soft.  Musculoskeletal:     Cervical back: Normal range of motion.  Lymphadenopathy:     Cervical: No cervical adenopathy.  Skin:    General: Skin is warm and dry.     Capillary Refill: Capillary refill takes less than 2 seconds.  Neurological:     Mental Status: She is alert and oriented to person, place, and time.  Psychiatric:        Behavior: Behavior normal.      Musculoskeletal Exam: Cervical, thoracic and lumbar spine were in good range of motion.  Shoulder joints, elbow joints, wrist joints, MCPs PIPs and DIPs with good range of motion with no synovitis.  Hip joints, knee joints, ankles, MTPs and PIPs with good range of motion with no synovitis.  No muscular weakness or tenderness was noted.  CDAI Exam: CDAI Score: -- Patient Global: --; Provider Global: -- Swollen: --; Tender: -- Joint Exam 09/26/2022   No joint exam has been documented for this visit   There is currently no information documented on the homunculus. Go to the Rheumatology activity and complete the homunculus joint exam.  Investigation: No additional findings.  Imaging: No results found.  Recent Labs: Lab Results  Component Value Date   WBC 10.4 09/21/2022   HGB 11.7 (L) 09/21/2022   PLT 370 09/21/2022   NA 140 09/21/2022   K 3.0 (L) 09/21/2022   CL 101 09/21/2022   CO2 29 09/21/2022   GLUCOSE 90 09/21/2022   BUN 15 09/21/2022   CREATININE 0.91 09/21/2022   BILITOT 0.3 04/09/2022   ALKPHOS 59 04/09/2022   AST 15 04/09/2022   ALT 12 04/09/2022   PROT 6.2 04/09/2022   ALBUMIN 4.2 04/09/2022   CALCIUM 8.2 (L) 09/21/2022   GFRAA 99 07/06/2020    Speciality Comments: No specialty comments available.  Procedures:  No procedures performed Allergies: Shellfish-derived products and Sulfonamide derivatives   Assessment / Plan:      Visit Diagnoses: Temporal arteritis ([place]) -patient symptoms started in August with right parietal, right temporal headaches, jaw pain and neck stiffness.  She was initially treated with antibiotics.  As her symptoms persist she had evaluation by ENT per patient and had CT scan of the sinus which was normal.  She also saw her dentist for TMJ pain.  On July 16, 2022 she noticed right eye floaters her vision rapidly deteriorated over the next 2 days and she had complete vision loss of her right eye on October 9.  She was seen by her ophthalmologist and was placed on prednisone 80 mg p.o. daily.  She had temporal artery biopsy on October 13 by Dr. Scot Dock which came positive for temporal arteritis.  She continues to have right eye blindness.  Detailed counsel regarding temporal arteritis was provided.  Different treatment options and their side effects were discussed at length.  I advised her to stay on prednisone 40 mg in the morning and 40 mg at noon time for 1 week and then we will reduce prednisone to 40 mg in the morning and 30 mg at noon.  We will discuss further dosing at the follow-up visit.  I have also had a detailed discussion regarding starting on Actemra.  Indications side effects contraindications of Actemra were discussed at length.  Handout was given and consent was taken.  We will start her on Actemra as soon as it is approved.  Plan: Sedimentation rate, will schedule MRA of the chest to evaluate for any other vascular involvement.  MR ANGIO CHEST W WO CONTRAST  Medication Counseling:   Lipid Panel Lab Results  Component Value Date   CHOL 191 04/09/2022   HDL 80 04/09/2022   LDLCALC 95 04/09/2022   TRIG 87 04/09/2022   CHOLHDL 2.4 04/09/2022     Chest x-ray: February 07, 2018  Counseled patient that Actemra is an IL-6 blocking agent.  Reviewed Actemra dose of 4 mg/kg every 4 weeks.  Counseled patient on purpose, proper use, and adverse effects of Actemra.  Reviewed the most common  adverse effects including infections, injection site reaction, bowel injury, and rarely cancer and conditions of the nervous system.  Reviewed that the medication should be held  during infections.  Discussed that there is the possibility of an increased risk of malignancy but it is not well understood if this increased risk is due to the medication or the disease state.  Counseled patient that Actemra should be held prior to scheduled surgery.  Counseled patient to avoid live vaccines while on Actemra.  Recommend patient to get annual influenza vaccine, pneumococcal vaccine and Shingrix as indicated.   Reviewed the importance of regular labs while on Actemra therapy including the need for routine lipid panel. Standing orders placed.  Provided patient with medication education material and answered all questions.  Patient voiced understanding.  Patient consented to Actemra.  Will upload consent into the media tab.  Will submit benefits investigation for Actemra.     Amaurosis fugax of right eye-patient is followed by [facility] ophthalmology.  Long term (current) use of systemic steroids-she is currently on prednisone 80 mg p.o. daily.  We will try to taper it gradually.  Tapering schedule was discussed with the patient.  I will check CMP today.  She will need close monitoring of her blood glucose levels while she is on high-dose prednisone.  High risk medication use -before initiating Actemra I will obtain following labs today.  Plan: Hepatitis B core antibody, IgM, Hepatitis B surface antigen, Hepatitis C antibody, QuantiFERON-TB Gold Plus, Serum protein electrophoresis with reflex, IgG, IgA, IgM  Age-related osteoporosis without current pathological fracture -I do not have the recent DEXA report available.  She is on Zometa IV X 2 by oncologist. -Most recent CMP showed low calcium.  Calcium rich diet with total calcium intake of 1200 mg p.o. daily was discussed.  I will also check vitamin D level today.   Plan: VITAMIN D 25 Hydroxy (Vit-D Deficiency, Fractures)  Vitamin D deficiency -patient had vitamin D deficiency in the past.  I will check vitamin D level today.  Plan: VITAMIN D 25 Hydroxy (Vit-D Deficiency, Fractures)  Primary hypertension-blood pressure was elevated today most likely due to steroid use.  She was advised to monitor blood pressure closely.  Pure hypercholesterolemia-she is currently not taking statins.  Malignant neoplasm of upper-inner quadrant of left breast in female, estrogen receptor positive ([place]) - 2016 lumpectomy, LN resection, RTX and tamoxifen, followed by [facility] oncology.  Generalized anxiety disorder-patient states her anxiety is related to medical issues.  Orders: Orders Placed This Encounter  Procedures   MR ANGIO CHEST W WO CONTRAST   Sedimentation rate   Hepatitis B core antibody, IgM   Hepatitis B surface antigen   Hepatitis C antibody   QuantiFERON-TB Gold Plus   Serum protein electrophoresis with reflex   IgG, IgA, IgM   VITAMIN D 25 Hydroxy (Vit-D Deficiency, Fractures)   COMPLETE METABOLIC PANEL WITH GFR   No orders of the defined types were placed in this encounter.   Face-to-face time spent with patient was over 60 minutes. Greater than 50% of time was spent in counseling and coordination of care.  Follow-Up Instructions: Return for Temporal arteritis.   Bo Merino, MD  Note - This record has been created using Editor, commissioning.  Chart creation errors have been sought, but may not always  have been located. Such creation errors do not reflect on  the standard of medical care.

## 2022-09-27 NOTE — Telephone Encounter (Signed)
Received notification from [facility] regarding a prior authorization for [place]. Authorization has been APPROVED from 09/26/22 to 09/27/23.   Patient must fill through [facility]: [phone]. Unable to run test claim  Authorization #  [id number]  Enrolled patient into Actemra copay card: ID: [id number] RxGroup: [id number] RxBIN: [id number] RxPCN: [id number]  Actemra new start is pending baseline immunosuppressive labs to result  Knox Saliva, PharmD, MPH, BCPS, CPP Clinical Pharmacist (Rheumatology and Pulmonology)

## 2022-09-28 DIAGNOSIS — H47011 Ischemic optic neuropathy, right eye: Secondary | ICD-10-CM | POA: Diagnosis not present

## 2022-09-28 DIAGNOSIS — H2513 Age-related nuclear cataract, bilateral: Secondary | ICD-10-CM | POA: Diagnosis not present

## 2022-10-02 NOTE — Progress Notes (Signed)
Potassium is low.  Please notify patient and forward results to her PCP.  I will discuss the results at the follow-up visit.  We can proceed with Actemra most of her labs are back .

## 2022-10-03 LAB — COMPLETE METABOLIC PANEL WITH GFR
AG Ratio: 1.6 (calc) (ref 1.0–2.5)
ALT: 20 U/L (ref 6–29)
AST: 16 U/L (ref 10–35)
Albumin: 3.9 g/dL (ref 3.6–5.1)
Alkaline phosphatase (APISO): 69 U/L (ref 37–153)
BUN: 16 mg/dL (ref 7–25)
CO2: 23 mmol/L (ref 20–32)
Calcium: 8.3 mg/dL — ABNORMAL LOW (ref 8.6–10.4)
Chloride: 103 mmol/L (ref 98–110)
Creat: 0.77 mg/dL (ref 0.50–1.05)
Globulin: 2.5 g/dL (calc) (ref 1.9–3.7)
Glucose, Bld: 77 mg/dL (ref 65–99)
Potassium: 3.3 mmol/L — ABNORMAL LOW (ref 3.5–5.3)
Sodium: 142 mmol/L (ref 135–146)
Total Bilirubin: 0.4 mg/dL (ref 0.2–1.2)
Total Protein: 6.4 g/dL (ref 6.1–8.1)
eGFR: 85 mL/min/{1.73_m2} (ref >=60)

## 2022-10-03 LAB — HEPATITIS C ANTIBODY: Hepatitis C Ab: NONREACTIVE

## 2022-10-03 LAB — VITAMIN D 25 HYDROXY (VIT D DEFICIENCY, FRACTURES): Vit D, 25-Hydroxy: 83 ng/mL (ref 30–100)

## 2022-10-03 LAB — HEPATITIS B CORE ANTIBODY, IGM: Hep B C IgM: NONREACTIVE

## 2022-10-03 LAB — IGG, IGA, IGM
IgG (Immunoglobin G), Serum: 834 mg/dL (ref 600–1540)
IgM, Serum: 91 mg/dL (ref 50–300)
Immunoglobulin A: 97 mg/dL (ref 70–320)

## 2022-10-03 LAB — PROTEIN ELECTROPHORESIS, SERUM, WITH REFLEX
Albumin ELP: 3.6 g/dL — ABNORMAL LOW (ref 3.8–4.8)
Alpha 1: 0.4 g/dL — ABNORMAL HIGH (ref 0.2–0.3)
Alpha 2: 0.9 g/dL (ref 0.5–0.9)
Beta 2: 0.2 g/dL (ref 0.2–0.5)
Beta Globulin: 0.4 g/dL (ref 0.4–0.6)
Gamma Globulin: 0.7 g/dL — ABNORMAL LOW (ref 0.8–1.7)
Total Protein: 6.2 g/dL (ref 6.1–8.1)

## 2022-10-03 LAB — QUANTIFERON-TB GOLD PLUS
Mitogen-NIL: 0.68 IU/mL
NIL: 0.02 IU/mL
QuantiFERON-TB Gold Plus: NEGATIVE
TB1-NIL: 0 IU/mL
TB2-NIL: 0.01 IU/mL

## 2022-10-03 LAB — SEDIMENTATION RATE: Sed Rate: 2 mm/h (ref 0–30)

## 2022-10-03 LAB — IFE INTERPRETATION

## 2022-10-03 LAB — HEPATITIS B SURFACE ANTIGEN: Hepatitis B Surface Ag: NONREACTIVE

## 2022-10-03 NOTE — Telephone Encounter (Signed)
Patient returned call. She is scheduled for Actemra new start tomororw 10/04/22. Will use sample.  Patient has scheduled MRI for 10/18/22. She has f/u appt with Dr. Estanislado Pandy on 10/11/22.  Knox Saliva, PharmD, MPH, BCPS, CPP Clinical Pharmacist (Rheumatology and Pulmonology)

## 2022-10-03 NOTE — Telephone Encounter (Signed)
Per Dr. Estanislado Pandy - pt can now start Actemra since most labs have returned and wnl. ATC patient to schedule Actemra new start. Unable to reach. Left VM requesting return call  Knox Saliva, PharmD, MPH, BCPS, CPP Clinical Pharmacist (Rheumatology and Pulmonology)

## 2022-10-04 ENCOUNTER — Ambulatory Visit: Payer: BC Managed Care – PPO | Attending: Rheumatology | Admitting: Pharmacist

## 2022-10-04 DIAGNOSIS — M316 Other giant cell arteritis: Secondary | ICD-10-CM

## 2022-10-04 DIAGNOSIS — Z79899 Other long term (current) drug therapy: Secondary | ICD-10-CM

## 2022-10-04 DIAGNOSIS — Z7189 Other specified counseling: Secondary | ICD-10-CM

## 2022-10-04 MED ORDER — ACTEMRA ACTPEN 162 MG/0.9ML ~~LOC~~ SOAJ: 162.0000 mg | SUBCUTANEOUS | 0 refills | Status: DC

## 2022-10-04 NOTE — Progress Notes (Signed)
Pharmacy Note  Subjective:   Patient presents to clinic today with her husband, Annie Main, to receive first dose of Actemra of temporal arteritis. Patient currently takes prednisone '40mg'$  in AM and '40mg'$  at noon (total '80mg'$  daily).  Patient running a fever or have signs/symptoms of infection? No  Patient currently on antibiotics for the treatment of infection? No  Patient have any upcoming invasive procedures/surgeries? No  Objective: CMP     Component Value Date/Time   NA 142 09/26/2022 0915   NA 142 04/09/2022 1610   K 3.3 (L) 09/26/2022 0915   CL 103 09/26/2022 0915   CO2 23 09/26/2022 0915   GLUCOSE 77 09/26/2022 0915   BUN 16 09/26/2022 0915   BUN 10 04/09/2022 1610   CREATININE 0.77 09/26/2022 0915   CALCIUM 8.3 (L) 09/26/2022 0915   PROT 6.2 09/26/2022 0915   PROT 6.4 09/26/2022 0915   PROT 6.2 04/09/2022 1610   ALBUMIN 4.2 04/09/2022 1610   AST 16 09/26/2022 0915   ALT 20 09/26/2022 0915   ALKPHOS 59 04/09/2022 1610   BILITOT 0.4 09/26/2022 0915   BILITOT 0.3 04/09/2022 1610   GFRNONAA >60 09/21/2022 1126   GFRNONAA 89 05/15/2013 1029   GFRAA 99 07/06/2020 1528   GFRAA >89 05/15/2013 1029    CBC    Component Value Date/Time   WBC 10.4 09/21/2022 1126   RBC 3.88 09/21/2022 1126   HGB 11.7 (L) 09/21/2022 1126   HGB 12.6 04/09/2022 1610   HCT 34.6 (L) 09/21/2022 1126   HCT 36.6 04/09/2022 1610   PLT 370 09/21/2022 1126   PLT 252 04/09/2022 1610   MCV 89.2 09/21/2022 1126   MCV 89 04/09/2022 1610   MCH 30.2 09/21/2022 1126   MCHC 33.8 09/21/2022 1126   RDW 12.9 09/21/2022 1126   RDW 12.2 04/09/2022 1610   LYMPHSABS 1.8 07/06/2020 1528   EOSABS 0.2 07/06/2020 1528   BASOSABS 0.1 07/06/2020 1528    Baseline Immunosuppressant Therapy Labs TB GOLD    Latest Ref Rng & Units 09/26/2022    9:15 AM  Quantiferon TB Gold  Quantiferon TB Gold Plus NEGATIVE NEGATIVE    Hepatitis Panel    Latest Ref Rng & Units 09/26/2022    9:15 AM  Hepatitis  Hep B  Surface Ag NON-REACTIVE NON-REACTIVE   Hep B IgM NON-REACTIVE NON-REACTIVE   Hep C Ab NON-REACTIVE NON-REACTIVE    HIV No results found for: "HIV" Immunoglobulins    Latest Ref Rng & Units 09/26/2022    9:15 AM  Immunoglobulin Electrophoresis  IgA  70 - 320 mg/dL 97   IgG 600 - 1,540 mg/dL 834   IgM 50 - 300 mg/dL 91    SPEP    Latest Ref Rng & Units 09/26/2022    9:15 AM  Serum Protein Electrophoresis  Total Protein 6.1 - 8.1 g/dL 6.1 - 8.1 g/dL 6.2    6.4   Albumin 3.8 - 4.8 g/dL 3.6   Alpha-1 0.2 - 0.3 g/dL 0.4   Alpha-2 0.5 - 0.9 g/dL 0.9   Beta Globulin 0.4 - 0.6 g/dL 0.4   Beta 2 0.2 - 0.5 g/dL 0.2   Gamma Globulin 0.8 - 1.7 g/dL 0.7    G6PD No results found for: "G6PDH" TPMT No results found for: "TPMT"   Chest x-ray: 02/07/2018 -  1. No acute cardiopulmonary disease. 2. No evidence of a pulmonary nodule.  Assessment/Plan:  Reviewed importance of holding ACTEMRA with signs/symptoms of an infections, if antibiotics are prescribed  to treat an active infection, and with invasive procedures  Demonstrated proper injection technique with ACTEMRA demo device  Patient able to demonstrate proper injection technique using the teach back method.  Patient self injected in the left lower abdomen with:  Sample Medication: Actemra ActPen '162mg'$ /0.40m NDC: 560630-1601-09Lot: [id number]: 11/2023  Patient tolerated well.  Observed for 30 mins in office for adverse reaction. Patient denies itchiness or irritation. Provider notes no swelling or redness at injection site.   Patient is to return in 1 month for labs and 6-8 weeks for follow-up appointment.  Standing orders placed for CBC, CMP, and lipid panel placed today.  ACTEMRA approved through insurance .   [facility]: [phone]  Patient provided with pharmacy phone number and advised to call later this week to schedule shipment to home.  Patient will continue Actemra '162mg'$  SQ every 7 days  in combination with prednisone '80mg'$  daily (she has split this with '40mg'$  in AM and '40mg'$  at noon). She has f/u with Dr. DEstanislado Pandynetx and can review tapering plan at that visit. If she has not received Actemra shipment by that visit, we will have to provide her with a sample  All questions encouraged and answered.  Instructed patient to call with any further questions or concerns.  DKnox Saliva PharmD, MPH, BCPS, CPP Clinical Pharmacist (Rheumatology and Pulmonology)  10/04/2022 9:07 AM

## 2022-10-04 NOTE — Patient Instructions (Signed)
Your next ACTEMRA dose is due on 10/11/22, 10/18/22, and every 7 days thereafter  CONTINUE prednisone '40mg'$  in morning and '40mg'$  around noontime until your follow-up appt next week  HOLD ACTEMRA if you have signs or symptoms of an infection. You can resume once you feel better or back to your baseline. HOLD ACTEMRA if you start antibiotics to treat an infection. HOLD ACTEMRA around the time of surgery/procedures. Your surgeon will be able to provide recommendations on when to hold BEFORE and when you are cleared to [place].  Pharmacy information: Your prescription [facility] shipped from [facility]. Their phone number is [phone] Please call to schedule shipment and confirm address. They will mail your medication to your home.  Cost information: Your copay should be affordable. If you call the pharmacy and it is not affordable, please double-check that they are billing through your copay card as secondary coverage. That copay card information is: ID: [id number] RxGroup: [id number] RxBIN: [id number] RxPCN: [id number]  Labs are due in 1 month then every 3 months. Lab hours are from Monday to Thursday 1:30-4:30pm and Friday 1:30-4pm. You do not need an appointment if you come for labs during these times.  How to manage an injection site reaction: Remember the 5 C's: COUNTER - leave on the counter at least 30 minutes but up to overnight to bring medication to room temperature. This may help prevent stinging COLD - place something cold (like an ice gel pack or cold water bottle) on the injection site just before cleansing with alcohol. This may help reduce pain CLARITIN - use Claritin (generic name is loratadine) for the first two weeks of treatment or the day of, the day before, and the day after injecting. This will help to minimize injection site reactions CORTISONE CREAM - apply if injection site is irritated and itching CALL ME - if injection site reaction is bigger than the size of your  fist, looks infected, blisters, or if you develop hives

## 2022-10-04 NOTE — Progress Notes (Signed)
Office Visit Note  Patient: Cindy Blake             Date of Birth: [birth year]           MRN: [id number]             PCP: Janora Norlander, DO Referring: Janora Norlander, DO Visit Date: 10/11/2022 Occupation: @GUAROCC @  Subjective:  Medication management  History of Present Illness: Cindy Blake is a 66 y.o. female striae of temporal arteritis.  She was seen initially on September 26, 2022.  She started prednisone on September 18, 2022.  At August 18 visit she was advised to stay on prednisone 40 mg twice daily for 1 week and then the plan was to taper prednisone by 10 mg every week.  She also received her first Actemra injection on October 04 2022.  She tolerated the injection well without any side effects.  She had another injection this morning at home by herself which she tolerated well.  She has not noticed any improvement in her vision.  She has been followed by Dr. Felix Ahmadi at [facility].  Denies any muscular weakness or muscular pain.  She denies any history of temporal headaches.  Activities of Daily Living:  Patient reports morning stiffness for 0 minutes.   Patient Denies nocturnal pain.  Difficulty dressing/grooming: Denies Difficulty climbing stairs: Denies Difficulty getting out of chair: Denies Difficulty using hands for taps, buttons, cutlery, and/or writing: Denies  Review of Systems  Constitutional:  Positive for fatigue.  HENT:  Positive for mouth dryness. Negative for mouth sores.   Eyes:  Positive for dryness.  Respiratory:  Negative for shortness of breath.   Cardiovascular:  Negative for chest pain and palpitations.  Gastrointestinal:  Negative for blood in stool, constipation and diarrhea.  Endocrine: Negative for increased urination.  Genitourinary:  Negative for involuntary urination.  Musculoskeletal:  Negative for joint pain, gait problem, joint pain, joint swelling, myalgias, muscle weakness, morning stiffness, muscle tenderness and  myalgias.  Skin:  Positive for sensitivity to sunlight. Negative for color change, rash and hair loss.  Allergic/Immunologic: Negative for susceptible to infections.  Neurological:  Negative for dizziness and headaches.  Hematological:  Negative for swollen glands.  Psychiatric/Behavioral:  Positive for sleep disturbance. Negative for depressed mood. The patient is not nervous/anxious.     PMFS History:  Patient Active Problem List   Diagnosis Date Noted   Osteoporosis 12/08/2019   Malignant neoplasm of left female breast ([place]) 05/01/2016   Cancer of upper-inner quadrant of female breast ([place]) 02/01/2015   Generalized anxiety disorder 11/10/2013   Allergic rhinitis 11/10/2013   Hyperlipemia 07/06/2013   Hypertension 07/06/2013   Vitamin D deficiency 07/06/2013    Past Medical History:  Diagnosis Date   Allergy    Cancer ([place])    Right Breast   Complication of anesthesia    Family history of adverse reaction to anesthesia    nausea   History of colon polyps    Hypertension    IBS (irritable bowel syndrome)    Mitral valve disorder    MVP (mitral valve prolapse)    PONV (postoperative nausea and vomiting)     Family History  Problem Relation Age of Onset   Cancer Mother    Hypertension Father    Stroke Father    Heart attack Father    Arthritis Father    Hypertension Sister    Hypertension Sister    Lung cancer Brother  Multiple sclerosis Brother    Healthy Daughter    Past Surgical History:  Procedure Laterality Date   ABDOMINAL HYSTERECTOMY  age 51   APPENDECTOMY     ARTERY BIOPSY Right 09/21/2022   Procedure: Right BIOPSY TEMPORAL ARTERY;  Surgeon: Waynetta Sandy, MD;  Location: [place];  Service: Vascular;  Laterality: Right;   CHOLECYSTECTOMY     LASIK  2000   SHOULDER SURGERY Right    bone spur   Social History   Social History Narrative   Not on file   Immunization History  Administered Date(s) Administered   Fluad Quad(high Dose 65+)  10/10/2022   Influenza Inj Mdck Quad With Preservative 09/19/2020   Influenza,inj,Quad PF,6+ Mos 10/11/2015, 09/03/2016, 09/26/2017, 10/08/2018, 10/05/2019   Influenza-Unspecified 09/22/2020, 09/27/2021   PFIZER(Purple Top)SARS-COV-2 Vaccination 02/14/2020, 03/06/2020, 09/30/2020, 09/08/2021   Pneumococcal Conjugate-13 01/09/2017   Pneumococcal Polysaccharide-23 07/07/2021   Tdap 06/05/2018   Zoster Recombinat (Shingrix) 07/07/2021, 10/10/2021     Objective: Vital Signs: BP (!) 150/78 (BP Location: Right Arm, Patient Position: Sitting, Cuff Size: Normal)   Pulse 71   Resp 13   Ht $R'5\' 3"'XT$  (1.6 m)   Wt 145 lb 12.8 oz (66.1 kg)   BMI 25.83 kg/m    Physical Exam Vitals and nursing note reviewed.  Constitutional:      Appearance: She is well-developed.  HENT:     Head: Normocephalic and atraumatic.  Eyes:     Conjunctiva/sclera: Conjunctivae normal.  Cardiovascular:     Rate and Rhythm: Normal rate and regular rhythm.     Heart sounds: Normal heart sounds.  Pulmonary:     Effort: Pulmonary effort is normal.     Breath sounds: Normal breath sounds.  Abdominal:     General: Bowel sounds are normal.     Palpations: Abdomen is soft.  Musculoskeletal:     Cervical back: Normal range of motion.  Lymphadenopathy:     Cervical: No cervical adenopathy.  Skin:    General: Skin is warm and dry.     Capillary Refill: Capillary refill takes less than 2 seconds.  Neurological:     Mental Status: She is alert and oriented to person, place, and time.  Psychiatric:        Behavior: Behavior normal.      Musculoskeletal Exam: Cervical, thoracic and lumbar spine were in good range of motion.  Shoulder joints, elbow joints, wrist joints, MCPs PIPs and DIPs and good range of motion with no synovitis.  Hip joints, knee joints, ankles, MTPs and PIPs with good range of motion with no synovitis.  She had hypermobility in her joints.  CDAI Exam: CDAI Score: -- Patient Global: --; Provider  Global: -- Swollen: --; Tender: -- Joint Exam 10/11/2022   No joint exam has been documented for this visit   There is currently no information documented on the homunculus. Go to the Rheumatology activity and complete the homunculus joint exam.  Investigation: No additional findings.  Imaging: No results found.  Recent Labs: Lab Results  Component Value Date   WBC 10.4 09/21/2022   HGB 11.7 (L) 09/21/2022   PLT 370 09/21/2022   NA 142 09/26/2022   K 3.3 (L) 09/26/2022   CL 103 09/26/2022   CO2 23 09/26/2022   GLUCOSE 77 09/26/2022   BUN 16 09/26/2022   CREATININE 0.77 09/26/2022   BILITOT 0.4 09/26/2022   ALKPHOS 59 04/09/2022   AST 16 09/26/2022   ALT 20 09/26/2022   PROT 6.2 09/26/2022  PROT 6.4 09/26/2022   ALBUMIN 4.2 04/09/2022   CALCIUM 8.3 (L) 09/26/2022   GFRAA 99 07/06/2020   QFTBGOLDPLUS NEGATIVE 09/26/2022   September 26, 2022 IFE normal, TB Gold negative, immunoglobulins normal, sed rate 2, hepatitis B negative, hepatitis C negative, vitamin D 83   Speciality Comments: [facility] [place]  Procedures:  No procedures performed Allergies: Shellfish-derived products and Sulfonamide derivatives   Assessment / Plan:     Visit Diagnoses: Temporal arteritis ([place]) - Symptoms a started August 2023 and diagnosed October 2023.  Biopsy-proven with right eye vision loss.  She has been on prednisone 80 mg p.o. daily.  We discussed her prednisone tapering schedule.  She had her first Actemra subcu injection on October 04, 2022 without any side effects.  MRA chest pending.  High risk medication use - Started Actemra subcutaneous on October 04, 2022.  She will continue Actemra 162 mg subcu every 7 days in combination with prednisone taper.  Patient took a second dose of Actemra 162 mg subcutaneous this morning.  She denies any side effects from Actemra.  We will check labs today.  She will need close monitoring of glucose that she is on  high-dose prednisone.- Plan: CBC with Differential/Platelet, CMP14+EGFR  Long term (current) use of systemic steroids - Prednisone 80 mg p.o. daily.  Prednisone taper schedule was given to the patient today.  Prednisone 40 mg by mouth a.m., prednisone 30 mg by mouth p.m. for 1 week Prednisone 40 mg by mouth a.m., prednisone 20 mg by mouth p.m. for 1 week Prednisone 40 mg by mouth a.m., prednisone 10 mg by mouth p.m. for 1 week Prednisone 40 mg by mouth a.m. for 1 week Prednisone 35 mg by mouth a.m. for 1 week Prednisone 30 mg by mouth a.m. for 1 week Prednisone 25 mg by mouth a.m. for 1 week Prednisone 20 mg by mouth a.m. for 1 week Prednisone 17.5 mg by mouth a.m. for 1 week Prednisone 15 mg by mouth a.m. for 1 week Prednisone 12.5 mg mouth a.m. for 2 weeks Prednisone 10 mg by mouth a.m. for 2 weeks we will discuss further taper at the follow-up visit.  Amaurosis fugax of right eye-she is [facility] by Dr[facility].  Patient has an appointment today.  Age-related osteoporosis without current pathological fracture - Zometa IV x2 by oncologist.  Use of calcium rich diet and vitamin D was discussed.  Vitamin D deficiency-her vitamin D was normal at 83 on September 26, 2022.  Primary hypertension-blood pressure was elevated most likely due to prednisone use.  Blood pressure was 144/73.  She was advised to monitor blood pressure closely and follow-up with her PCP if needed.  Pure hypercholesterolemia  Malignant neoplasm of upper-inner quadrant of left breast in female, estrogen receptor positive ([place]) - 2016 lumpectomy, LN resection, RTX and tamoxifen, followed by [facility] oncology.  Generalized anxiety disorder  Orders: Orders Placed This Encounter  Procedures   CBC with Differential/Platelet   CMP14+EGFR   Meds ordered this encounter  Medications   predniSONE (DELTASONE) 20 MG tablet    Sig: 40 mg by mouth a.m., 30 mg by mouth p.m. for 1 week, 40 mg by mouth a.m., 20 mg  by mouth p.m. for 1 week, 40 mg by mouth a.m.,10 mg by mouth p.m. for 1 week, 40 mg by mouth a.m. for 1 week.    Dispense:  77 tablet    Refill:  0     Follow-Up Instructions: Return in  about 4 weeks (around 11/08/2022) for Temporal arteritis, Osteoporosis.   Bo Merino, MD  Note - This record has been created using Editor, commissioning.  Chart creation errors have been sought, but may not always  have been located. Such creation errors do not reflect on  the standard of medical care.

## 2022-10-10 ENCOUNTER — Encounter: Payer: Self-pay | Admitting: Family Medicine

## 2022-10-10 ENCOUNTER — Ambulatory Visit: Payer: BC Managed Care – PPO | Admitting: Family Medicine

## 2022-10-10 VITALS — BP 139/71 | HR 64 | Temp 98.3°F | Ht 63.0 in | Wt 144.8 lb

## 2022-10-10 DIAGNOSIS — Z23 Encounter for immunization: Secondary | ICD-10-CM

## 2022-10-10 DIAGNOSIS — C50212 Malignant neoplasm of upper-inner quadrant of left female breast: Secondary | ICD-10-CM | POA: Diagnosis not present

## 2022-10-10 DIAGNOSIS — Z79899 Other long term (current) drug therapy: Secondary | ICD-10-CM

## 2022-10-10 DIAGNOSIS — I10 Essential (primary) hypertension: Secondary | ICD-10-CM

## 2022-10-10 DIAGNOSIS — Z170 Estrogen receptor positive status [ER+]: Secondary | ICD-10-CM

## 2022-10-10 DIAGNOSIS — M316 Other giant cell arteritis: Secondary | ICD-10-CM

## 2022-10-10 DIAGNOSIS — F411 Generalized anxiety disorder: Secondary | ICD-10-CM | POA: Diagnosis not present

## 2022-10-10 MED ORDER — ALPRAZOLAM 1 MG PO TABS: ORAL_TABLET | ORAL | 5 refills | Status: DC

## 2022-10-10 NOTE — Progress Notes (Signed)
Subjective: CC: Follow-up GCA, anxiety disorder PCP: Janora Norlander, DO VVO:[id number] Cindy Blake is a 67 y.o. female presenting to clinic today for:  1.  Generalized anxiety disorder with panic attacks Patient continues use the alprazolam regularly.  She has certainly been needing this after she was diagnosed with giant cell arteritis.  She is currently under treatment with both steroids and with immunosuppressants.  Has an appoint with rheumatology tomorrow as well as her ophthalmologist.  She unfortunately has not regained vision in that right eye.  She reports it being totally black.  She worries that she will not have return of vision in that eye.  2.  Hypertension Patient is compliant with her blood pressure medications.  Home blood pressures run 130s over 60s.  No chest pain, shortness of breath reported.   ROS: Per HPI  Allergies  Allergen Reactions   Shellfish-Derived Products Hives   Sulfonamide Derivatives     REACTION: hives, itching, swelling   Past Medical History:  Diagnosis Date   Allergy    Cancer ([place])    Right Breast   Complication of anesthesia    Family history of adverse reaction to anesthesia    nausea   History of colon polyps    Hypertension    IBS (irritable bowel syndrome)    Mitral valve disorder    MVP (mitral valve prolapse)    PONV (postoperative nausea and vomiting)     Current Outpatient Medications:    ALPRAZolam (XANAX) 1 MG tablet, TAKE 1/2 TO 1 TABLET BID AS NEEDED FOR ANXIETY & SLEEP (Patient taking differently: Take 1 mg by mouth at bedtime.), Disp: 45 tablet, Rfl: 5   ascorbic acid (VITAMIN C) 500 MG tablet, Take 500 mg by mouth daily., Disp: , Rfl:    aspirin 81 MG tablet, Take 81 mg by mouth daily., Disp: , Rfl:    Calcium Carbonate (CALCIUM 600 PO), Take 600 mg by mouth daily., Disp: , Rfl:    cetirizine (ZYRTEC) 10 MG tablet, Take 10 mg by mouth every morning., Disp: , Rfl:    Cholecalciferol (VITAMIN D) 2000 UNITS CAPS, Take  2,000 Units by mouth every morning., Disp: , Rfl:    cyanocobalamin (VITAMIN B12) 1000 MCG tablet, Take 1,000 mcg by mouth daily., Disp: , Rfl:    diphenhydrAMINE (BENADRYL) 25 MG tablet, Take 25 mg by mouth every 6 (six) hours as needed (Congestion)., Disp: , Rfl:    fluticasone (FLONASE) 50 MCG/ACT nasal spray, Place 2 sprays into both nostrils daily. (Patient taking differently: Place 2 sprays into both nostrils daily as needed for rhinitis.), Disp: 16 g, Rfl: 6   hydrochlorothiazide (HYDRODIURIL) 25 MG tablet, Take 1 tablet (25 mg total) by mouth daily., Disp: 90 tablet, Rfl: 3   losartan (COZAAR) 50 MG tablet, Take 1 tablet (50 mg total) by mouth daily., Disp: 90 tablet, Rfl: 3   Multiple Vitamin (MULTIVITAMIN) tablet, Take 1 tablet by mouth daily., Disp: , Rfl:    potassium chloride (KLOR-CON M10) 10 MEQ tablet, TAKE 1 TABLET TWICE DAILY MON,WED,FRIDAY AND TAKE 1 TABLET DAILY ON TUE,THUR, SAT, SUN (Patient taking differently: Take 10 mEq by mouth daily with supper.), Disp: 120 tablet, Rfl: 2   predniSONE (DELTASONE) 20 MG tablet, Take 80 mg by mouth daily with breakfast., Disp: , Rfl:    tamoxifen (NOLVADEX) 20 MG tablet, Take 20 mg by mouth at bedtime., Disp: , Rfl:    Tocilizumab (ACTEMRA ACTPEN) 162 MG/0.9ML SOAJ, Inject 162 mg into the skin  every 7 (seven) days., Disp: 10.8 mL, Rfl: 0   Zoledronic Acid (ZOMETA IV), Inject 4 mg into the vein every 6 (six) months. Last IV: 07/11/2022, Disp: , Rfl:    traMADol (ULTRAM) 50 MG tablet, Take 1 tablet (50 mg total) by mouth every 6 (six) hours as needed. (Patient not taking: Reported on 09/26/2022), Disp: 5 tablet, Rfl: 0 Social History   Socioeconomic History   Marital status: Married    Spouse name: Not on file   Number of children: Not on file   Years of education: Not on file   Highest education level: Not on file  Occupational History   Not on file  Tobacco Use   Smoking status: Never    Passive exposure: Never   Smokeless tobacco:  Never  Vaping Use   Vaping Use: Never used  Substance and Sexual Activity   Alcohol use: Not Currently   Drug use: No   Sexual activity: Not on file  Other Topics Concern   Not on file  Social History Narrative   Not on file   Social Determinants of Health   Financial Resource Strain: Not on file  Food Insecurity: Not on file  Transportation Needs: Not on file  Physical Activity: Not on file  Stress: Not on file  Social Connections: Not on file  Intimate Partner Violence: Not on file   Family History  Problem Relation Age of Onset   Cancer Mother    Hypertension Father    Stroke Father    Heart attack Father    Arthritis Father    Hypertension Sister    Hypertension Sister    Lung cancer Brother    Multiple sclerosis Brother    Healthy Daughter     Objective: Office vital signs reviewed. BP 139/71   Pulse 64   Temp 98.3 F (36.8 C)   Ht '5\' 3"'$  (1.6 m)   Wt 144 lb 12.8 oz (65.7 kg)   SpO2 99%   BMI 25.65 kg/m   Physical Examination:  General: Awake, alert, well nourished, No acute distress HEENT: sclera white, MMM Cardio: regular rate and rhythm, S1S2 heard, no murmurs appreciated Pulm: clear to auscultation bilaterally, no wheezes, rhonchi or rales; normal work of breathing on room air MSK: Ambulating independently Neuro: Cannot see out of the right eye Psych: Somewhat anxious but very pleasant, interactive.  Good eye contact  Assessment/ Plan: 66 y.o. female   GCA (giant cell arteritis) ([place])  Generalized anxiety disorder - Plan: ToxASSURE Select 13 (MW), Urine, ALPRAZolam (XANAX) 1 MG tablet  Chronic prescription benzodiazepine use - Plan: ToxASSURE Select 13 (MW), Urine  Malignant neoplasm of upper-inner quadrant of left breast in female, estrogen receptor positive ([place])  Need for immunization against influenza - Plan: Flu Vaccine QUAD High Dose(Fluad)  Essential hypertension  Currently under treatment for giant cell arteritis and has not  regained her vision in that right eye.  Has appointments with both her ophthalmologist and her rheumatologist tomorrow.  Tolerating new medications without difficulty thus far.  I did offer her to get any of the labs that need to be obtained by her specialists here in the office.  I just asked that they be ordered through [facility] and we can gladly collect those so that she does not have to drive far distances for just lab checks.  I will plan to see her back sometime end of February for interval checkup, sooner if concerns arise.  Benzodiazepine was renewed.  UDS and CSC  updated.  National narcotic database reviewed and there were no red flags.  She had a tramadol prescription which was totally appropriate after biopsy of her temporal artery.  Not utilizing this medication currently  Given immunocompromise state from the medications being used for GCA and history of breast cancer, highly recommended vaccination and therefore influenza vaccination administered  Blood pressure controlled with current regimen.  No changes  No orders of the defined types were placed in this encounter.  No orders of the defined types were placed in this encounter.    Janora Norlander, DO [place] [phone]

## 2022-10-11 ENCOUNTER — Encounter: Payer: Self-pay | Admitting: Rheumatology

## 2022-10-11 ENCOUNTER — Ambulatory Visit: Payer: BC Managed Care – PPO | Attending: Rheumatology | Admitting: Rheumatology

## 2022-10-11 VITALS — BP 144/73 | HR 64 | Resp 13 | Ht 63.0 in | Wt 145.8 lb

## 2022-10-11 DIAGNOSIS — C50212 Malignant neoplasm of upper-inner quadrant of left female breast: Secondary | ICD-10-CM

## 2022-10-11 DIAGNOSIS — E559 Vitamin D deficiency, unspecified: Secondary | ICD-10-CM

## 2022-10-11 DIAGNOSIS — M316 Other giant cell arteritis: Secondary | ICD-10-CM

## 2022-10-11 DIAGNOSIS — Z7952 Long term (current) use of systemic steroids: Secondary | ICD-10-CM

## 2022-10-11 DIAGNOSIS — H2513 Age-related nuclear cataract, bilateral: Secondary | ICD-10-CM | POA: Diagnosis not present

## 2022-10-11 DIAGNOSIS — Z79899 Other long term (current) drug therapy: Secondary | ICD-10-CM

## 2022-10-11 DIAGNOSIS — G453 Amaurosis fugax: Secondary | ICD-10-CM | POA: Diagnosis not present

## 2022-10-11 DIAGNOSIS — F411 Generalized anxiety disorder: Secondary | ICD-10-CM

## 2022-10-11 DIAGNOSIS — H02831 Dermatochalasis of right upper eyelid: Secondary | ICD-10-CM | POA: Diagnosis not present

## 2022-10-11 DIAGNOSIS — Z170 Estrogen receptor positive status [ER+]: Secondary | ICD-10-CM

## 2022-10-11 DIAGNOSIS — H47011 Ischemic optic neuropathy, right eye: Secondary | ICD-10-CM | POA: Diagnosis not present

## 2022-10-11 DIAGNOSIS — M810 Age-related osteoporosis without current pathological fracture: Secondary | ICD-10-CM

## 2022-10-11 DIAGNOSIS — I10 Essential (primary) hypertension: Secondary | ICD-10-CM

## 2022-10-11 DIAGNOSIS — E7800 Pure hypercholesterolemia, unspecified: Secondary | ICD-10-CM

## 2022-10-11 MED ORDER — PREDNISONE 20 MG PO TABS: ORAL_TABLET | ORAL | 0 refills | Status: DC

## 2022-10-11 NOTE — Patient Instructions (Addendum)
Prednisone tapering schedule:  Prednisone 40 mg by mouth a.m., prednisone 30 mg by mouth p.m. for 1 week Prednisone 40 mg by mouth a.m., prednisone 20 mg by mouth p.m. for 1 week Prednisone 40 mg by mouth a.m., prednisone 10 mg by mouth p.m. for 1 week Prednisone 40 mg by mouth a.m. for 1 week Prednisone 35 mg by mouth a.m. for 1 week Prednisone 30 mg by mouth a.m. for 1 week Prednisone 25 mg by mouth a.m. for 1 week Prednisone 20 mg by mouth a.m. for 1 week Prednisone 17.5 mg by mouth a.m. for 1 week Prednisone 15 mg by mouth a.m. for 1 week Prednisone 12.5 mg mouth a.m. for 2 weeks Prednisone 10 mg by mouth a.m. for 2 weeks we will discuss further taper at the follow-up visit.

## 2022-10-12 LAB — CBC WITH DIFFERENTIAL/PLATELET
Absolute Monocytes: 547 cells/uL (ref 200–950)
Basophils Absolute: 14 cells/uL (ref 0–200)
Basophils Relative: 0.1 %
Eosinophils Absolute: 0 cells/uL — ABNORMAL LOW (ref 15–500)
Eosinophils Relative: 0 %
HCT: 40.1 % (ref 35.0–45.0)
Hemoglobin: 13.6 g/dL (ref 11.7–15.5)
Lymphs Abs: 778 cells/uL — ABNORMAL LOW (ref 850–3900)
MCH: 30.4 pg (ref 27.0–33.0)
MCHC: 33.9 g/dL (ref 32.0–36.0)
MCV: 89.5 fL (ref 80.0–100.0)
MPV: 10.8 fL (ref 7.5–12.5)
Monocytes Relative: 3.8 %
Neutro Abs: 13061 cells/uL — ABNORMAL HIGH (ref 1500–7800)
Neutrophils Relative %: 90.7 %
Platelets: 164 10*3/uL (ref 140–400)
RBC: 4.48 10*6/uL (ref 3.80–5.10)
RDW: 13.7 % (ref 11.0–15.0)
Total Lymphocyte: 5.4 %
WBC: 14.4 10*3/uL — ABNORMAL HIGH (ref 3.8–10.8)

## 2022-10-12 LAB — COMPLETE METABOLIC PANEL WITH GFR
AG Ratio: 1.8 (calc) (ref 1.0–2.5)
ALT: 24 U/L (ref 6–29)
AST: 14 U/L (ref 10–35)
Albumin: 3.7 g/dL (ref 3.6–5.1)
Alkaline phosphatase (APISO): 56 U/L (ref 37–153)
BUN: 24 mg/dL (ref 7–25)
CO2: 27 mmol/L (ref 20–32)
Calcium: 8.3 mg/dL — ABNORMAL LOW (ref 8.6–10.4)
Chloride: 102 mmol/L (ref 98–110)
Creat: 0.87 mg/dL (ref 0.50–1.05)
Globulin: 2.1 g/dL (calc) (ref 1.9–3.7)
Glucose, Bld: 73 mg/dL (ref 65–99)
Potassium: 3.4 mmol/L — ABNORMAL LOW (ref 3.5–5.3)
Sodium: 140 mmol/L (ref 135–146)
Total Bilirubin: 0.5 mg/dL (ref 0.2–1.2)
Total Protein: 5.8 g/dL — ABNORMAL LOW (ref 6.1–8.1)
eGFR: 73 mL/min/{1.73_m2} (ref >=60)

## 2022-10-12 NOTE — Progress Notes (Signed)
White cell count is elevated due to prednisone use.  CMP is normal.

## 2022-10-13 LAB — TOXASSURE SELECT 13 (MW), URINE

## 2022-10-17 ENCOUNTER — Ambulatory Visit (HOSPITAL_COMMUNITY)
Admission: RE | Admit: 2022-10-17 | Discharge: 2022-10-17 | Disposition: A | Discharge status: Home / Self Care | Payer: BC Managed Care – PPO | Source: Ambulatory Visit | Attending: Rheumatology | Admitting: Rheumatology

## 2022-10-17 DIAGNOSIS — R531 Weakness: Secondary | ICD-10-CM | POA: Diagnosis not present

## 2022-10-17 DIAGNOSIS — M316 Other giant cell arteritis: Secondary | ICD-10-CM

## 2022-10-17 DIAGNOSIS — Z8679 Personal history of other diseases of the circulatory system: Secondary | ICD-10-CM | POA: Diagnosis not present

## 2022-10-17 DIAGNOSIS — N6320 Unspecified lump in the left breast, unspecified quadrant: Secondary | ICD-10-CM | POA: Diagnosis not present

## 2022-10-17 MED ORDER — GADOBUTROL 1 MMOL/ML IV SOLN
7.0000 mL | Freq: Once | INTRAVENOUS | Status: AC | PRN
  Administered 2022-10-17: 7 mL via INTRAVENOUS

## 2022-10-17 NOTE — Progress Notes (Signed)
MRA negative for vasculitis in the thoracic region.

## 2022-10-25 ENCOUNTER — Ambulatory Visit: Payer: BC Managed Care – PPO | Admitting: Rheumatology

## 2022-10-31 NOTE — Progress Notes (Signed)
Office Visit Note  Patient: Cindy Blake             Date of Birth: 05-16-1956           MRN: [id number]             PCP: Janora Norlander, DO Referring: Janora Norlander, DO Visit Date: 11/14/2022 Occupation: '@GUAROCC'$ @  Subjective:  Medication management  History of Present Illness: Cindy Blake is a 66 y.o. female with history of temporal arteritis.  She has been gradually tapering prednisone and is currently on prednisone 30 mg p.o. daily which she will be finishing on Saturday.  She is tapering prednisone by 5 mg every week until she reaches 20 mg p.o. daily.  After that she will be tapering by 2.5 mg p.o. weekly.  She continues to take Actemra injections which she started on October 26 and has been tolerating them well without any side effects.  She denies any recurrence of headaches however muscle weakness or tenderness.  She states she has gained weight despite trying.  She has been walking for exercise.  Activities of Daily Living:  Patient reports morning stiffness for 0 minutes.   Patient Denies nocturnal pain.  Difficulty dressing/grooming: Denies Difficulty climbing stairs: Denies Difficulty getting out of chair: Denies Difficulty using hands for taps, buttons, cutlery, and/or writing: Denies  Review of Systems  Constitutional:  Negative for fatigue.  HENT:  Negative for mouth sores and mouth dryness.   Eyes:  Negative for dryness.  Respiratory:  Negative for shortness of breath.   Cardiovascular:  Negative for chest pain and palpitations.  Gastrointestinal:  Negative for blood in stool, constipation and diarrhea.  Endocrine: Negative for increased urination.  Genitourinary:  Negative for involuntary urination.  Musculoskeletal:  Negative for joint pain, gait problem, joint pain, joint swelling, myalgias, muscle weakness, morning stiffness, muscle tenderness and myalgias.  Skin:  Negative for color change, rash, hair loss and sensitivity to sunlight.   Allergic/Immunologic: Negative for susceptible to infections.  Neurological:  Negative for dizziness and headaches.  Hematological:  Negative for swollen glands.  Psychiatric/Behavioral:  Positive for sleep disturbance. Negative for depressed mood. The patient is nervous/anxious.     PMFS History:  Patient Active Problem List   Diagnosis Date Noted   Osteoporosis 12/08/2019   Malignant neoplasm of left female breast ([place]) 05/01/2016   Cancer of upper-inner quadrant of female breast ([place]) 02/01/2015   Generalized anxiety disorder 11/10/2013   Allergic rhinitis 11/10/2013   Hyperlipemia 07/06/2013   Hypertension 07/06/2013   Vitamin D deficiency 07/06/2013    Past Medical History:  Diagnosis Date   Allergy    Cancer ([place])    Right Breast   Complication of anesthesia    Family history of adverse reaction to anesthesia    nausea   History of colon polyps    Hypertension    IBS (irritable bowel syndrome)    Mitral valve disorder    MVP (mitral valve prolapse)    PONV (postoperative nausea and vomiting)     Family History  Problem Relation Age of Onset   Cancer Mother    Hypertension Father    Stroke Father    Heart attack Father    Arthritis Father    Hypertension Sister    Hypertension Sister    Lung cancer Brother    Multiple sclerosis Brother    Healthy Daughter    Past Surgical History:  Procedure Laterality Date   ABDOMINAL HYSTERECTOMY  age 17   [place] Right 09/21/2022   Procedure: Right BIOPSY TEMPORAL ARTERY;  Surgeon: Waynetta Sandy, MD;  Location: [facility];  Service: Vascular;  Laterality: Right;   CHOLECYSTECTOMY     LASIK  2000   SHOULDER SURGERY Right    bone spur   Social History   Social History Narrative   Not on file   Immunization History  Administered Date(s) Administered   Fluad Quad(high Dose 65+) 10/10/2022   Influenza Inj Mdck Quad With Preservative 09/19/2020   Influenza,inj,Quad PF,6+ Mos  10/11/2015, 09/03/2016, 09/26/2017, 10/08/2018, 10/05/2019   Influenza-Unspecified 09/22/2020, 09/27/2021   PFIZER(Purple Top)SARS-COV-2 Vaccination 02/14/2020, 03/06/2020, 09/30/2020, 09/08/2021   Pneumococcal Conjugate-13 01/09/2017   Pneumococcal Polysaccharide-23 07/07/2021   Tdap 06/05/2018   Zoster Recombinat (Shingrix) 07/07/2021, 10/10/2021     Objective: Vital Signs: BP (!) 156/100 (BP Location: Right Arm, Patient Position: Sitting, Cuff Size: Normal)   Pulse 71   Resp 15   Ht '5\' 3"'$  (1.6 m)   Wt 154 lb (69.9 kg)   BMI 27.28 kg/m    Physical Exam   Musculoskeletal Exam: Cervical, thoracic lumbar spine with good range of motion.  Shoulder joints, elbow joints, wrist joints, MCPs PIPs and DIPs Juengel range of motion with no synovitis.  Hip joints and knee joints in good range of motion.  She had no tenderness over ankles or MTPs.  CDAI Exam: CDAI Score: -- Patient Global: --; Provider Global: -- Swollen: --; Tender: -- Joint Exam 11/14/2022   No joint exam has been documented for this visit   There is currently no information documented on the homunculus. Go to the Rheumatology activity and complete the homunculus joint exam.  Investigation: No additional findings.  Imaging: MR ANGIO CHEST W WO CONTRAST  Result Date: 10/17/2022 CLINICAL DATA:  Temporal arteritis. Reported right eye vision loss x 2 weeks, weakness EXAM: MRA CHEST WITH OR WITHOUT CONTRAST TECHNIQUE: Angiographic images of the chest were obtained using MRA technique without and with intravenous contrast. Multiplanar, multisequence imaging including (3D post-processing) reconstructions and MIPs were obtained to evaluate the vascular anatomy. CONTRAST:  14m GADAVIST GADOBUTROL 1 MMOL/ML IV SOLN COMPARISON:  Chest XR, most recently 02/07/2018. FINDINGS: Limitations by motion: Moderate Preferential opacification of the thoracic aorta. No evidence of thoracic aortic aneurysm or dissection. Thoracic Aorta:  --Ascending Aorta: 3.0 cm --Aortic Arch: 2.6 cm --Descending Aorta: 2.1 cm Other: Conventional LEFT aortic arch with variant arch origin of the LEFT vertebral artery. No hemodynamic stenosis at the origins of the great vessels. Normal heart size. No pericardial effusion. No central or larger pulmonary embolus. Mediastinum/Nodes: No enlarged mediastinal, hilar, or axillary lymph nodes. Thyroid gland, trachea, and esophagus demonstrate no significant findings. Lungs/Pleura: No focal consolidation.  No pleural effusion. Upper Abdomen: No ascites.  Focal splenic lesion, likely hemangioma. Musculoskeletal: Mildly-enhancing mass-like area within the LEFT breast, and susceptibility artifact within and adjacent, likely post lumpectomy versus biopsy site with clip. No marrow replacing lesion or significant osseous findings. IMPRESSION: 1. No acute vascular abnormality. Patent great vessels without MRA evidence of aneurysmal dilation. 2. Mass-like area within the LEFT breast, and susceptibility artifact within and adjacent, likely post lumpectomy versus biopsy site with clip. If clinical concern, correlate with routine mammogram. JMichaelle Birks MD [facility] Electronically Signed   By: JMichaelle BirksM.D.   On: 10/17/2022 11:56    Recent Labs: Lab Results  Component Value Date   WBC 14.4 (H)  10/11/2022   HGB 13.6 10/11/2022   PLT 164 10/11/2022   NA 140 10/11/2022   K 3.4 (L) 10/11/2022   CL 102 10/11/2022   CO2 27 10/11/2022   GLUCOSE 73 10/11/2022   BUN 24 10/11/2022   CREATININE 0.87 10/11/2022   BILITOT 0.5 10/11/2022   ALKPHOS 59 04/09/2022   AST 14 10/11/2022   ALT 24 10/11/2022   PROT 5.8 (L) 10/11/2022   ALBUMIN 4.2 04/09/2022   CALCIUM 8.3 (L) 10/11/2022   GFRAA 99 07/06/2020   QFTBGOLDPLUS NEGATIVE 09/26/2022      Speciality Comments: Ophthalmologist-[facility] Samantha [place]  Procedures:  No procedures  performed Allergies: Shellfish-derived products and Sulfonamide derivatives   Assessment / Plan:     Visit Diagnoses: Temporal arteritis ([place]) - Symptoms a started August 2023 and diagnosed October 2023.  Biopsy-proven with right eye vision loss.  She has been on a gradual prednisone taper and currently taking prednisone 30 mg p.o. daily since last Saturday.  She has been tapering prednisone by 5 mg every week until she reaches 20 mg and then she will taper by 2.5 mg every week.  She has been on Actemra 162 mg subcu every 7 days.  She has been tolerating Actemra without any side effects.  She has gained weight since she has been on prednisone.  MRI of her chest was unremarkable.  High risk medication use - Actemra 162 mg subcu every 7 days in combination with prednisone taper.  Current prednisone dose 30 mg p.o. daily.  I will check CMP and sed rate today.  Long term (current) use of systemic steroids-long-term side effects of prednisone use including weight gain, increased risk of cataracts, hyperlipidemia, hypertension, hyperglycemia and osteoporosis were discussed at length.  Amaurosis fugax of right eye - she is followed by Dr. Felix Ahmadi at [facility].  Patient has an appointment tomorrow.  Patient believes that she can see some shadow in her right eye now.  Age-related osteoporosis without current pathological fracture - Zometa IV x2 by oncologist.  Vitamin D deficiency-she has been taking vitamin D supplement.  Pure hypercholesterolemia-dietary modifications were discussed.  Weight gain-patient has gained weight since her last visit.  Her BMI today was 27.28.  Dietary modifications were discussed and a handout was placed in the AVS.  Primary hypertension-blood pressure was elevated at 156/100 today.  Repeat blood pressure was 155/74.  She was advised to monitor blood pressure closely and follow-up with her PCP.  Dietary modifications were discussed.    Malignant neoplasm of  upper-inner quadrant of left breast in female, estrogen receptor positive ([place]) - 2016 lumpectomy, LN resection, RTX and tamoxifen, followed by [facility] oncology.  Generalized anxiety disorder-patient states she is feels anxious about her current situation.  Orders: Orders Placed This Encounter  Procedures   COMPLETE METABOLIC PANEL WITH GFR   Sedimentation rate   No orders of the defined types were placed in this encounter.    Follow-Up Instructions: Return in about 4 weeks (around 12/12/2022) for Temporal arteritis.   Bo Merino, MD  Note - This record has been created using [employer].  Chart creation errors have been sought, but may not always  have been located. Such creation errors do not reflect on  the standard of medical care.

## 2022-11-04 ENCOUNTER — Other Ambulatory Visit: Payer: Self-pay | Admitting: Rheumatology

## 2022-11-05 DIAGNOSIS — Z90710 Acquired absence of both cervix and uterus: Secondary | ICD-10-CM | POA: Diagnosis not present

## 2022-11-05 DIAGNOSIS — Z853 Personal history of malignant neoplasm of breast: Secondary | ICD-10-CM | POA: Diagnosis not present

## 2022-11-05 DIAGNOSIS — Z01419 Encounter for gynecological examination (general) (routine) without abnormal findings: Secondary | ICD-10-CM | POA: Diagnosis not present

## 2022-11-05 DIAGNOSIS — Z6826 Body mass index (BMI) 26.0-26.9, adult: Secondary | ICD-10-CM | POA: Diagnosis not present

## 2022-11-05 NOTE — Telephone Encounter (Signed)
Next Visit: 11/14/2022  Last Visit: 10/11/2022  Last Fill: 10/11/2022  Dx: Temporal arteritis   Current Dose per office note on 10/11/2022: Prednisone 35 mg by mouth a.m. for 1 week Prednisone 30 mg by mouth a.m. for 1 week Prednisone 25 mg by mouth a.m. for 1 week Prednisone 20 mg by mouth a.m. for 1 week  Okay to refill Prednisone?

## 2022-11-14 ENCOUNTER — Ambulatory Visit: Payer: BC Managed Care – PPO | Attending: Rheumatology | Admitting: Rheumatology

## 2022-11-14 ENCOUNTER — Encounter: Payer: Self-pay | Admitting: Rheumatology

## 2022-11-14 VITALS — BP 155/74 | HR 72 | Resp 15 | Ht 63.0 in | Wt 154.0 lb

## 2022-11-14 DIAGNOSIS — R635 Abnormal weight gain: Secondary | ICD-10-CM

## 2022-11-14 DIAGNOSIS — Z7952 Long term (current) use of systemic steroids: Secondary | ICD-10-CM | POA: Diagnosis not present

## 2022-11-14 DIAGNOSIS — M316 Other giant cell arteritis: Secondary | ICD-10-CM

## 2022-11-14 DIAGNOSIS — G453 Amaurosis fugax: Secondary | ICD-10-CM

## 2022-11-14 DIAGNOSIS — Z170 Estrogen receptor positive status [ER+]: Secondary | ICD-10-CM

## 2022-11-14 DIAGNOSIS — E559 Vitamin D deficiency, unspecified: Secondary | ICD-10-CM

## 2022-11-14 DIAGNOSIS — Z79899 Other long term (current) drug therapy: Secondary | ICD-10-CM

## 2022-11-14 DIAGNOSIS — E7800 Pure hypercholesterolemia, unspecified: Secondary | ICD-10-CM

## 2022-11-14 DIAGNOSIS — F411 Generalized anxiety disorder: Secondary | ICD-10-CM

## 2022-11-14 DIAGNOSIS — I10 Essential (primary) hypertension: Secondary | ICD-10-CM

## 2022-11-14 DIAGNOSIS — M810 Age-related osteoporosis without current pathological fracture: Secondary | ICD-10-CM

## 2022-11-14 DIAGNOSIS — C50212 Malignant neoplasm of upper-inner quadrant of left female breast: Secondary | ICD-10-CM

## 2022-11-14 NOTE — Patient Instructions (Addendum)
  Prednisone 30 mg by mouth a.m. for 1 week Prednisone 25 mg by mouth a.m. for 1 week Prednisone 20 mg by mouth a.m. for 1 week Prednisone 17.5 mg by mouth a.m. for 1 week Prednisone 15 mg by mouth a.m. for 1 week   Heart Disease Prevention   Your inflammatory disease increases your risk of heart disease which includes heart attack, stroke, atrial fibrillation (irregular heartbeats), high blood pressure, heart failure and atherosclerosis (plaque in the arteries).  It is important to reduce your risk by:   Keep blood pressure, cholesterol, and blood sugar at healthy levels   Smoking Cessation   Maintain a healthy weight  BMI 20-25   Eat a healthy diet  Plenty of fresh fruit, vegetables, and whole grains  Limit saturated fats, foods high in sodium, and added sugars  DASH and Mediterranean diet   Increase physical activity  Recommend moderate physically activity for 150 minutes per week/ 30 minutes a day for five days a week These can be broken up into three separate ten-minute sessions during the day.   Reduce Stress  Meditation, slow breathing exercises, yoga, coloring books  Dental visits twice a year

## 2022-11-15 DIAGNOSIS — H47011 Ischemic optic neuropathy, right eye: Secondary | ICD-10-CM | POA: Diagnosis not present

## 2022-11-15 DIAGNOSIS — H2513 Age-related nuclear cataract, bilateral: Secondary | ICD-10-CM | POA: Diagnosis not present

## 2022-11-15 DIAGNOSIS — H02831 Dermatochalasis of right upper eyelid: Secondary | ICD-10-CM | POA: Diagnosis not present

## 2022-11-15 DIAGNOSIS — H54415A Blindness right eye category 5, normal vision left eye: Secondary | ICD-10-CM | POA: Diagnosis not present

## 2022-11-15 LAB — COMPLETE METABOLIC PANEL WITH GFR
AG Ratio: 2.2 (calc) (ref 1.0–2.5)
ALT: 21 U/L (ref 6–29)
AST: 15 U/L (ref 10–35)
Albumin: 3.8 g/dL (ref 3.6–5.1)
Alkaline phosphatase (APISO): 52 U/L (ref 37–153)
BUN: 24 mg/dL (ref 7–25)
CO2: 26 mmol/L (ref 20–32)
Calcium: 8 mg/dL — ABNORMAL LOW (ref 8.6–10.4)
Chloride: 106 mmol/L (ref 98–110)
Creat: 0.76 mg/dL (ref 0.50–1.05)
Globulin: 1.7 g/dL (calc) — ABNORMAL LOW (ref 1.9–3.7)
Glucose, Bld: 102 mg/dL — ABNORMAL HIGH (ref 65–99)
Potassium: 4.1 mmol/L (ref 3.5–5.3)
Sodium: 140 mmol/L (ref 135–146)
Total Bilirubin: 0.4 mg/dL (ref 0.2–1.2)
Total Protein: 5.5 g/dL — ABNORMAL LOW (ref 6.1–8.1)
eGFR: 86 mL/min/{1.73_m2} (ref >=60)

## 2022-11-15 LAB — SEDIMENTATION RATE: Sed Rate: 2 mm/h (ref 0–30)

## 2022-11-15 NOTE — Progress Notes (Signed)
CMP is stable and sed rate is normal.  She should continue with the prednisone taper as discussed.

## 2022-11-23 DIAGNOSIS — R92323 Mammographic fibroglandular density, bilateral breasts: Secondary | ICD-10-CM | POA: Diagnosis not present

## 2022-11-23 DIAGNOSIS — Z1231 Encounter for screening mammogram for malignant neoplasm of breast: Secondary | ICD-10-CM | POA: Diagnosis not present

## 2022-12-01 ENCOUNTER — Other Ambulatory Visit: Payer: Self-pay | Admitting: Rheumatology

## 2022-12-05 ENCOUNTER — Telehealth: Payer: Self-pay

## 2022-12-05 NOTE — Telephone Encounter (Signed)
Patient called requesting refill on Prednisone(Long Term Taper) Advised will send this request to provider for decision. Next OV 12/18/22.

## 2022-12-05 NOTE — Telephone Encounter (Signed)
Patient is currently taking 20 mg prednisone, patient has enough meds until Saturday, patient has f/u appt 12/18/2022, patient should taper down by 2.5 mg weekly, patient needs 10 mg and 5 mg sent to pharmacy to last until f/u appt.

## 2022-12-05 NOTE — Telephone Encounter (Signed)
Please clarify what dose of prednisone she is taking?  She is following a prednisone taper but it is unclear what her current dose is to then determine what dose she is tapering to?

## 2022-12-05 NOTE — Telephone Encounter (Signed)
Please clarify current dose

## 2022-12-06 ENCOUNTER — Other Ambulatory Visit: Payer: Self-pay | Admitting: *Deleted

## 2022-12-06 ENCOUNTER — Other Ambulatory Visit: Payer: Self-pay | Admitting: Family

## 2022-12-06 MED ORDER — PREDNISONE 5 MG PO TABS: ORAL_TABLET | ORAL | 0 refills | Status: DC

## 2022-12-06 NOTE — Telephone Encounter (Signed)
Next Visit: 12/18/2022  Last Visit: 11/14/2022  Last Fill: 11/05/2022  Dx: Temporal arteritis   Current Dose per office note on 11/14/2022: gradual prednisone taper and currently taking prednisone 30 mg p.o. daily since last Saturday. She has been tapering prednisone by 5 mg every week until she reaches 20 mg and then she will taper by 2.5 mg every week. I called patient, patient is taking 20 mg prednisone until 12/08/2022, then taper to 17.5 mg.  Okay to refill prednisone?

## 2022-12-11 ENCOUNTER — Other Ambulatory Visit: Payer: Self-pay | Admitting: Rheumatology

## 2022-12-11 NOTE — Telephone Encounter (Signed)
Next Visit: 12/18/2022  Last Visit: 11/14/2022  Last Fill: 10/04/2022  GX:[id number] arteritis   Current Dose per office note 11/14/2022: Actemra 162 mg subcu every 7 days.   Labs: 10/11/2022 White cell count is elevated due to prednisone use.  CMP is normal.   TB Gold: 09/26/2022 Neg    Okay to refill Actemra?

## 2022-12-11 NOTE — Progress Notes (Signed)
Office Visit Note  Patient: Cindy Blake             Date of Birth: Apr 25, 1956           MRN: [id number]             PCP: Janora Norlander, DO Referring: Janora Norlander, DO Visit Date: 12/18/2022 Occupation: '@GUAROCC'$ @  Subjective:  Medication monitoring  History of Present Illness: Cindy Blake is a 67 y.o. female history of temporal arteritis.  She has been tapering prednisone gradually and is currently on prednisone grams p.o. daily for the last week.  She has been tapering prednisone by 2.5 mg weekly until she is going to reach 10 mg p.o. daily.  She is been also getting Actemra injections weekly without any interruption.  She has been tolerating Actemra well.  She has not had any temporal headaches.  Has noticed minimal improvement in her right eye vision.  An appointment with the ophthalmologist on January 04, 2022.  Zometa IV infusion is due on January 11, 2022. She has been taking vitamin D on a regular basis.  Is been watching her diet and has not gained any weight since the last visit per patient.  Patient states before she left home her blood pressure was 134/59.    Activities of Daily Living:  Patient reports morning stiffness for 0 minutes.   Patient Denies nocturnal pain.  Difficulty dressing/grooming: Denies Difficulty climbing stairs: Denies Difficulty getting out of chair: Denies Difficulty using hands for taps, buttons, cutlery, and/or writing: Denies  Review of Systems  Constitutional:  Positive for fatigue.  HENT:  Negative for mouth sores and mouth dryness.   Eyes:  Negative for dryness.  Respiratory:  Negative for shortness of breath.   Cardiovascular:  Negative for chest pain and palpitations.  Gastrointestinal:  Negative for blood in stool, constipation and diarrhea.  Endocrine: Negative for increased urination.  Genitourinary:  Negative for involuntary urination.  Musculoskeletal:  Negative for joint pain, gait problem, joint pain, joint  swelling, myalgias, muscle weakness, morning stiffness, muscle tenderness and myalgias.  Skin:  Negative for color change, rash, hair loss and sensitivity to sunlight.  Allergic/Immunologic: Negative for susceptible to infections.  Neurological:  Negative for dizziness and headaches.  Hematological:  Positive for swollen glands.  Psychiatric/Behavioral:  Positive for depressed mood. Negative for sleep disturbance. The patient is nervous/anxious.     PMFS History:  Patient Active Problem List   Diagnosis Date Noted   Osteoporosis 12/08/2019   Malignant neoplasm of left female breast ([place]) 05/01/2016   Cancer of upper-inner quadrant of female breast ([place]) 02/01/2015   Generalized anxiety disorder 11/10/2013   Allergic rhinitis 11/10/2013   Hyperlipemia 07/06/2013   Hypertension 07/06/2013   Vitamin D deficiency 07/06/2013    Past Medical History:  Diagnosis Date   Allergy    Cancer ([place])    Right Breast   Complication of anesthesia    Family history of adverse reaction to anesthesia    nausea   History of colon polyps    Hypertension    IBS (irritable bowel syndrome)    Mitral valve disorder    MVP (mitral valve prolapse)    PONV (postoperative nausea and vomiting)     Family History  Problem Relation Age of Onset   Cancer Mother    Hypertension Father    Stroke Father    Heart attack Father    Arthritis Father    Hypertension Sister  Hypertension Sister    Lung cancer Brother    Multiple sclerosis Brother    Healthy Daughter    Past Surgical History:  Procedure Laterality Date   ABDOMINAL HYSTERECTOMY  age 59   APPENDECTOMY     ARTERY BIOPSY Right 09/21/2022   Procedure: Right BIOPSY TEMPORAL ARTERY;  Surgeon: Waynetta Sandy, MD;  Location: [facility];  Service: Vascular;  Laterality: Right;   CHOLECYSTECTOMY     LASIK  2000   SHOULDER SURGERY Right    bone spur   Social History   Social History Narrative   Not on file   Immunization History   Administered Date(s) Administered   Fluad Quad(high Dose 65+) 10/10/2022   Influenza Inj Mdck Quad With Preservative 09/19/2020   Influenza,inj,Quad PF,6+ Mos 10/11/2015, 09/03/2016, 09/26/2017, 10/08/2018, 10/05/2019   Influenza-Unspecified 09/22/2020, 09/27/2021   PFIZER(Purple Top)SARS-COV-2 Vaccination 02/14/2020, 03/06/2020, 09/30/2020, 09/08/2021   Pneumococcal Conjugate-13 01/09/2017   Pneumococcal Polysaccharide-23 07/07/2021   Tdap 06/05/2018   Zoster Recombinat (Shingrix) 07/07/2021, 10/10/2021     Objective: Vital Signs: BP (!) 141/76 (BP Location: Right Arm, Patient Position: Sitting, Cuff Size: Normal)   Pulse 76   Resp 15   Ht '5\' 3"'$  (1.6 m)   Wt 157 lb (71.2 kg)   BMI 27.81 kg/m    Physical Exam Vitals and nursing note reviewed.  Constitutional:      Appearance: She is well-developed.  HENT:     Head: Normocephalic and atraumatic.  Eyes:     Conjunctiva/sclera: Conjunctivae normal.     Comments: Right eye vision no loss noted.  Cardiovascular:     Rate and Rhythm: Normal rate and regular rhythm.     Heart sounds: Normal heart sounds.  Pulmonary:     Effort: Pulmonary effort is normal.     Breath sounds: Normal breath sounds.  Abdominal:     General: Bowel sounds are normal.     Palpations: Abdomen is soft.  Musculoskeletal:     Cervical back: Normal range of motion.  Lymphadenopathy:     Cervical: No cervical adenopathy.  Skin:    General: Skin is warm and dry.     Capillary Refill: Capillary refill takes less than 2 seconds.  Neurological:     Mental Status: She is alert and oriented to person, place, and time.  Psychiatric:        Behavior: Behavior normal.      Musculoskeletal Exam: Cervical, thoracic and lumbar spine were in good range of motion.  Shoulder joints, elbow joints, wrist joints, MCPs PIPs and DIPs been good range of motion with no synovitis.  Hip joints, knee joints were in good range of motion.  There is no tenderness over ankles  or MTPs.  CDAI Exam: CDAI Score: -- Patient Global: --; Provider Global: -- Swollen: --; Tender: -- Joint Exam 12/18/2022   No joint exam has been documented for this visit   There is currently no information documented on the homunculus. Go to the Rheumatology activity and complete the homunculus joint exam.  Investigation: No additional findings.  Imaging: No results found.  Recent Labs: Lab Results  Component Value Date   WBC 14.4 (H) 10/11/2022   HGB 13.6 10/11/2022   PLT 164 10/11/2022   NA 140 11/14/2022   K 4.1 11/14/2022   CL 106 11/14/2022   CO2 26 11/14/2022   GLUCOSE 102 (H) 11/14/2022   BUN 24 11/14/2022   CREATININE 0.76 11/14/2022   BILITOT 0.4 11/14/2022   ALKPHOS 59  04/09/2022   AST 15 11/14/2022   ALT 21 11/14/2022   PROT 5.5 (L) 11/14/2022   ALBUMIN 4.2 04/09/2022   CALCIUM 8.0 (L) 11/14/2022   GFRAA 99 07/06/2020   QFTBGOLDPLUS NEGATIVE 09/26/2022    Speciality Comments: [place]  Procedures:  No procedures performed Allergies: Shellfish-derived products and Sulfonamide derivatives   Assessment / Plan:     Visit Diagnoses: Temporal arteritis ([place]) - Symptoms a started August 2023 and diagnosed October 2023.  Biopsy-proven with right eye vision loss.  She has been taking Actemra 162 mg subcu weekly without any interruption.  She is currently on prednisone 15 mg p.o. daily for the last week.  She has not had any recurrence of temporal headaches.  She denies any muscular weakness or tenderness.  She has been tolerating both medications well.  She was advised to taper prednisone by 2.5 mg every 2 weeks until she reaches 10 mg and then by 1 mg every 2 weeks.  High risk medication use - Actemra 162 mg subcu every 7 days in combination with prednisone taper.  Labs from November 14, 2022 CMP was normal with calcium being low at 8.0.  Her last CBC showed elevated white cell count of 14.4 due to prednisone therapy.   Hemoglobin and platelets were normal.  She was advised to get repeat labs in March and every 3 months to monitor for drug toxicity.  TB Gold was negative on October,18 2023.  Will obtain lipid panel with her next labs in March.  She was advised to hold Actemra if she develops an infection resume after the infection resolves.  Information on immunization was also placed in the AVS.  Long term (current) use of systemic steroids-patient understands the long term side effects of steroids including weight gain, hyperlipidemia, hypertension, diabetes, osteoporosis and early cataracts.  Amaurosis fugax of right eye - she is followed by Dr. Felix Ahmadi at [facility].  Patient has an appointment is on January 26.  She has noticed minimal improvement in her right eye vision.  Age-related osteoporosis without current pathological fracture - Zometa IV x2 by oncologist.  Her next infusion is due on February 2.  Vitamin D deficiency-she has been taking vitamin D supplement.  Pure hypercholesterolemia  Primary hypertension-blood pressure was elevated in the office at 141/76.  Patient states her blood pressure was normal at home.  Malignant neoplasm of upper-inner quadrant of left breast in female, estrogen receptor positive ([place]) - 2016 lumpectomy, LN resection, RTX and tamoxifen, followed by [facility] oncology.  Generalized anxiety disorder  Weight gain-on prednisone.  She is trying to monitor her diet closely.  Dietary modifications were discussed.  Orders: No orders of the defined types were placed in this encounter.  Meds ordered this encounter  Medications   predniSONE (DELTASONE) 5 MG tablet    Sig: Take 2 tablets by mouth daily x 2 weeks, then taper by 1 mg every 2 weeks.    Dispense:  84 tablet    Refill:  0   predniSONE (DELTASONE) 1 MG tablet    Sig: Take 4 tabs po(along with '5mg'$  tab) x 2 weeks, 3 tabs po (along with '5mg'$  tab) x 2 weeks, 2 tabs po (along with '5mg'$  tab) x 2 weeks and 1 tab  po(along with '5mg'$  tab)  x 2 weeks.    Dispense:  140 tablet    Refill:  0     Follow-Up Instructions: Return for Temporal arteritis.   Bo Merino, MD  Note - This record has been created using [employer].  Chart creation errors have been sought, but may not always  have been located. Such creation errors do not reflect on  the standard of medical care.

## 2022-12-18 ENCOUNTER — Ambulatory Visit: Payer: BC Managed Care – PPO | Attending: Rheumatology | Admitting: Rheumatology

## 2022-12-18 ENCOUNTER — Encounter: Payer: Self-pay | Admitting: Rheumatology

## 2022-12-18 ENCOUNTER — Other Ambulatory Visit: Payer: Self-pay | Admitting: Family Medicine

## 2022-12-18 VITALS — BP 141/76 | HR 76 | Resp 15 | Ht 63.0 in | Wt 157.0 lb

## 2022-12-18 DIAGNOSIS — M316 Other giant cell arteritis: Secondary | ICD-10-CM

## 2022-12-18 DIAGNOSIS — Z79899 Other long term (current) drug therapy: Secondary | ICD-10-CM

## 2022-12-18 DIAGNOSIS — R635 Abnormal weight gain: Secondary | ICD-10-CM

## 2022-12-18 DIAGNOSIS — Z7952 Long term (current) use of systemic steroids: Secondary | ICD-10-CM

## 2022-12-18 DIAGNOSIS — F411 Generalized anxiety disorder: Secondary | ICD-10-CM

## 2022-12-18 DIAGNOSIS — E876 Hypokalemia: Secondary | ICD-10-CM

## 2022-12-18 DIAGNOSIS — G453 Amaurosis fugax: Secondary | ICD-10-CM

## 2022-12-18 DIAGNOSIS — Z170 Estrogen receptor positive status [ER+]: Secondary | ICD-10-CM

## 2022-12-18 DIAGNOSIS — I10 Essential (primary) hypertension: Secondary | ICD-10-CM

## 2022-12-18 DIAGNOSIS — E559 Vitamin D deficiency, unspecified: Secondary | ICD-10-CM

## 2022-12-18 DIAGNOSIS — M810 Age-related osteoporosis without current pathological fracture: Secondary | ICD-10-CM

## 2022-12-18 DIAGNOSIS — E7800 Pure hypercholesterolemia, unspecified: Secondary | ICD-10-CM

## 2022-12-18 DIAGNOSIS — C50212 Malignant neoplasm of upper-inner quadrant of left female breast: Secondary | ICD-10-CM

## 2022-12-18 MED ORDER — PREDNISONE 1 MG PO TABS: ORAL_TABLET | ORAL | 0 refills | Status: DC

## 2022-12-18 MED ORDER — PREDNISONE 5 MG PO TABS: ORAL_TABLET | ORAL | 0 refills | Status: DC

## 2022-12-18 NOTE — Patient Instructions (Addendum)
Prednisone 12.5 mg by mouth daily for 2 weeks Prednisone 10 mg mouth. daily for 2 weeks then taper by 5 mg by mouth daily every 2 weeks   Standing Labs We placed an order today for your standing lab work.   Please have your standing labs drawn in March and every 3 months  Please have your labs drawn 2 weeks prior to your appointment so that the provider can discuss your lab results at your appointment.  Please note that you may see your imaging and lab results in [place] before we have reviewed them. We will contact you once all results are reviewed. Please allow our office up to 72 hours to thoroughly review all of the results before contacting the office for clarification of your results.  Lab hours are:   Monday through Thursday from 8:00 am -12:30 pm and 1:00 pm-5:00 pm and Friday from 8:00 am-12:00 pm.  Please be advised, all patients with office appointments requiring lab work will take precedent over walk-in lab work.   Labs are drawn by [facility]. Please bring your co-pay at the time of your lab draw.  You may receive a bill from [facility] for your lab work.  Please note if you are on Hydroxychloroquine and and an order has been placed for a Hydroxychloroquine level, you will need to have it drawn 4 hours or more after your last dose.  If you wish to have your labs drawn at another location, please call the office 24 hours in advance so we can fax the orders.  The office is located at [address] [place], [place], [place] [address] No appointment is necessary.    If you have any questions regarding directions or hours of operation,  please call [phone].   As a reminder, please drink plenty of water prior to coming for your lab work. Thanks!   Vaccines You are taking a medication(s) that can suppress your immune system.  The following immunizations are recommended: Flu annually Covid-19  Td/Tdap (tetanus, diphtheria, pertussis) every 10 years Pneumonia (Prevnar 15  then Pneumovax 23 at least 1 year apart.  Alternatively, can take Prevnar 20 without needing additional dose) Shingrix: 2 doses from 4 weeks to 6 months apart  Please check with your PCP to make sure you are up to date.   If you have signs or symptoms of an infection or start antibiotics: First, call your PCP for workup of your infection. Hold your medication through the infection, until you complete your antibiotics, and until symptoms resolve if you take the following: Injectable medication (Actemra, Benlysta, Cimzia, Cosentyx, Enbrel, Humira, Kevzara, Orencia, Remicade, Simponi, Stelara, Taltz, Tremfya) Methotrexate Leflunomide (Arava) Mycophenolate (Cellcept) Morrie Sheldon, Olumiant, or Rinvoq   COVID-19 vaccine recommendations:   COVID-19 vaccine is recommended for everyone (unless you are allergic to a vaccine component), even if you are on a medication that suppresses your immune system.    Do not take Tylenol or any anti-inflammatory medications (NSAIDs) 24 hours prior to the COVID-19 vaccination.   There is no direct evidence about the efficacy of the COVID-19 vaccine in individuals who are on medications that suppress the immune system.   Even if you are fully vaccinated, and you are on any medications that suppress your immune system, please continue to wear a mask, maintain at least six feet social distance and practice hand hygiene.    The booster vaccine is now available for immunocompromised patients.   Please see the following web sites for updated information.  [email or web address]

## 2022-12-19 ENCOUNTER — Telehealth: Payer: Self-pay | Admitting: *Deleted

## 2022-12-19 NOTE — Telephone Encounter (Signed)
Patient called the office and left message requesting a return call to discuss Prednisone dose.  Returned call to patient and discussed prednisone taper as documented in office note. Patient comfortable with instructions and will call if she has any further questions or concerns.

## 2023-01-03 ENCOUNTER — Encounter: Payer: Self-pay | Admitting: Family Medicine

## 2023-01-04 DIAGNOSIS — H54415A Blindness right eye category 5, normal vision left eye: Secondary | ICD-10-CM | POA: Diagnosis not present

## 2023-01-04 DIAGNOSIS — H40012 Open angle with borderline findings, low risk, left eye: Secondary | ICD-10-CM | POA: Diagnosis not present

## 2023-01-04 DIAGNOSIS — H47011 Ischemic optic neuropathy, right eye: Secondary | ICD-10-CM | POA: Diagnosis not present

## 2023-01-07 ENCOUNTER — Other Ambulatory Visit: Payer: Self-pay | Admitting: Family Medicine

## 2023-01-07 ENCOUNTER — Other Ambulatory Visit: Payer: Self-pay | Admitting: Rheumatology

## 2023-01-07 DIAGNOSIS — E876 Hypokalemia: Secondary | ICD-10-CM

## 2023-01-11 DIAGNOSIS — M316 Other giant cell arteritis: Secondary | ICD-10-CM | POA: Diagnosis not present

## 2023-01-11 DIAGNOSIS — Z1589 Genetic susceptibility to other disease: Secondary | ICD-10-CM | POA: Diagnosis not present

## 2023-01-11 DIAGNOSIS — Z170 Estrogen receptor positive status [ER+]: Secondary | ICD-10-CM | POA: Diagnosis not present

## 2023-01-11 DIAGNOSIS — Z79810 Long term (current) use of selective estrogen receptor modulators (SERMs): Secondary | ICD-10-CM | POA: Diagnosis not present

## 2023-01-11 DIAGNOSIS — Z79899 Other long term (current) drug therapy: Secondary | ICD-10-CM | POA: Diagnosis not present

## 2023-01-11 DIAGNOSIS — D696 Thrombocytopenia, unspecified: Secondary | ICD-10-CM | POA: Diagnosis not present

## 2023-01-11 DIAGNOSIS — C50212 Malignant neoplasm of upper-inner quadrant of left female breast: Secondary | ICD-10-CM | POA: Diagnosis not present

## 2023-01-11 DIAGNOSIS — Z1501 Genetic susceptibility to malignant neoplasm of breast: Secondary | ICD-10-CM | POA: Diagnosis not present

## 2023-01-11 DIAGNOSIS — I10 Essential (primary) hypertension: Secondary | ICD-10-CM | POA: Diagnosis not present

## 2023-01-11 DIAGNOSIS — Z1502 Genetic susceptibility to malignant neoplasm of ovary: Secondary | ICD-10-CM | POA: Diagnosis not present

## 2023-01-11 DIAGNOSIS — Z1509 Genetic susceptibility to other malignant neoplasm: Secondary | ICD-10-CM | POA: Diagnosis not present

## 2023-01-11 DIAGNOSIS — M810 Age-related osteoporosis without current pathological fracture: Secondary | ICD-10-CM | POA: Diagnosis not present

## 2023-01-11 DIAGNOSIS — Z86010 Personal history of colonic polyps: Secondary | ICD-10-CM | POA: Diagnosis not present

## 2023-01-11 DIAGNOSIS — Z7982 Long term (current) use of aspirin: Secondary | ICD-10-CM | POA: Diagnosis not present

## 2023-01-17 ENCOUNTER — Other Ambulatory Visit: Payer: Self-pay | Admitting: Family Medicine

## 2023-01-17 DIAGNOSIS — J0100 Acute maxillary sinusitis, unspecified: Secondary | ICD-10-CM

## 2023-02-01 NOTE — Progress Notes (Signed)
Office Visit Note  Patient: Cindy Blake             Date of Birth: Jan 28, 1956           MRN: [id number]             PCP: Janora Norlander, DO Referring: Janora Norlander, DO Visit Date: 02/15/2023 Occupation: '@GUAROCC'$ @  Subjective:  Medication management  History of Present Illness: Cindy Blake is a 68 y.o. female with history of temporal arteritis.  She has been gradually tapering prednisone and is currently taking prednisone 8 mg p.o. daily.  Will be lowering prednisone to 7 mg p.o. daily next week.  She has been taking Actemra injections weekly without any interruption.  She developed shingles on her right hand a week ago for which she was treated with Varivax.  She denies any headaches.  She has not noticed any change in her vision.  Had Zometa IV infusion on February 2 which she tolerated well.  Is been exercising and taking vitamin D.    Activities of Daily Living:  Patient reports morning stiffness for 0 minutes.   Patient Denies nocturnal pain.  Difficulty dressing/grooming: Denies Difficulty climbing stairs: Denies Difficulty getting out of chair: Denies Difficulty using hands for taps, buttons, cutlery, and/or writing: Denies  Review of Systems  Constitutional: Negative.  Negative for fatigue.  HENT: Negative.  Negative for mouth sores and mouth dryness.   Eyes: Negative.   Respiratory: Negative.  Negative for shortness of breath.   Cardiovascular: Negative.  Negative for chest pain and palpitations.  Gastrointestinal: Negative.  Negative for blood in stool, constipation and diarrhea.  Endocrine: Negative.  Negative for increased urination.  Genitourinary: Negative.  Negative for involuntary urination.  Musculoskeletal:  Negative for joint pain, gait problem, joint pain, joint swelling, myalgias, muscle weakness, morning stiffness, muscle tenderness and myalgias.  Skin: Negative.  Negative for color change, rash, hair loss and sensitivity to sunlight.   Allergic/Immunologic: Negative.  Negative for susceptible to infections.  Neurological:  Negative for dizziness and headaches.  Hematological:  Negative for swollen glands.  Psychiatric/Behavioral:  Negative for depressed mood and sleep disturbance. The patient is nervous/anxious.     PMFS History:  Patient Active Problem List   Diagnosis Date Noted   Osteoporosis 12/08/2019   Malignant neoplasm of left female breast ([place]) 05/01/2016   Cancer of upper-inner quadrant of female breast ([place]) 02/01/2015   Generalized anxiety disorder 11/10/2013   Allergic rhinitis 11/10/2013   Hyperlipemia 07/06/2013   Hypertension 07/06/2013   Vitamin D deficiency 07/06/2013    Past Medical History:  Diagnosis Date   Allergy    Cancer ([place])    Right Breast   Complication of anesthesia    Family history of adverse reaction to anesthesia    nausea   History of colon polyps    Hypertension    IBS (irritable bowel syndrome)    Mitral valve disorder    MVP (mitral valve prolapse)    PONV (postoperative nausea and vomiting)     Family History  Problem Relation Age of Onset   Cancer Mother    Hypertension Father    Stroke Father    Heart attack Father    Arthritis Father    Hypertension Sister    Hypertension Sister    Lung cancer Brother    Multiple sclerosis Brother    Healthy Daughter    Past Surgical History:  Procedure Laterality Date   ABDOMINAL HYSTERECTOMY  age  [place] Right 09/21/2022   Procedure: Right BIOPSY TEMPORAL ARTERY;  Surgeon: Waynetta Sandy, MD;  Location: [facility];  Service: Vascular;  Laterality: Right;   CHOLECYSTECTOMY     LASIK  2000   SHOULDER SURGERY Right    bone spur   Social History   Social History Narrative   Not on file   Immunization History  Administered Date(s) Administered   Covid-19, Mrna,Vaccine(Spikevax)60yr and older 12/24/2022   Fluad Quad(high Dose 65+) 10/10/2022   Influenza Inj Mdck Quad With  Preservative 09/19/2020   Influenza,inj,Quad PF,6+ Mos 10/11/2015, 09/03/2016, 09/26/2017, 10/08/2018, 10/05/2019   Influenza-Unspecified 09/22/2020, 09/27/2021   PFIZER(Purple Top)SARS-COV-2 Vaccination 02/14/2020, 03/06/2020, 09/30/2020, 09/08/2021   Pneumococcal Conjugate-13 01/09/2017   Pneumococcal Polysaccharide-23 07/07/2021   Tdap 06/05/2018   Zoster Recombinat (Shingrix) 07/07/2021, 10/10/2021     Objective: Vital Signs: BP 138/65 (BP Location: Left Arm, Patient Position: Sitting, Cuff Size: Normal)   Pulse 73   Resp 14   Ht '5\' 3"'$  (1.6 m)   Wt 160 lb 9.6 oz (72.8 kg)   BMI 28.45 kg/m    Physical Exam Vitals and nursing note reviewed.  Constitutional:      Appearance: She is well-developed.  HENT:     Head: Normocephalic and atraumatic.  Eyes:     Conjunctiva/sclera: Conjunctivae normal.  Cardiovascular:     Rate and Rhythm: Normal rate and regular rhythm.     Heart sounds: Normal heart sounds.  Pulmonary:     Effort: Pulmonary effort is normal.     Breath sounds: Normal breath sounds.  Abdominal:     General: Bowel sounds are normal.     Palpations: Abdomen is soft.  Musculoskeletal:     Cervical back: Normal range of motion.  Lymphadenopathy:     Cervical: No cervical adenopathy.  Skin:    General: Skin is warm and dry.     Capillary Refill: Capillary refill takes less than 2 seconds.  Neurological:     Mental Status: She is alert and oriented to person, place, and time.     Comments: No temporal artery tenderness was noted.  Psychiatric:        Behavior: Behavior normal.      Musculoskeletal Exam: Cervical, thoracic and lumbar spine were in good range of motion.  Shoulder joints, elbow joints, wrist joints, MCPs PIPs and DIPs been good range of motion with no synovitis.  Hip joints, knee joints, ankles, MTPs and PIPs with good range of motion with no synovitis.  CDAI Exam: CDAI Score: -- Patient Global: --; Provider Global: -- Swollen: --; Tender:  -- Joint Exam 02/15/2023   No joint exam has been documented for this visit   There is currently no information documented on the homunculus. Go to the Rheumatology activity and complete the homunculus joint exam.  Investigation: No additional findings.  Imaging: No results found.  Recent Labs: Lab Results  Component Value Date   WBC 6.1 02/06/2023   HGB 13.5 02/06/2023   PLT 221 02/06/2023   NA 143 02/06/2023   K 3.4 (L) 02/06/2023   CL 101 02/06/2023   CO2 23 02/06/2023   GLUCOSE 99 02/06/2023   BUN 9 02/06/2023   CREATININE 0.85 02/06/2023   BILITOT 0.7 02/06/2023   ALKPHOS 42 (L) 02/06/2023   AST 20 02/06/2023   ALT 16 02/06/2023   PROT 6.1 02/06/2023   ALBUMIN 4.8 02/06/2023   CALCIUM 8.8 02/06/2023   GFRAA 99  07/06/2020   QFTBGOLDPLUS NEGATIVE 09/26/2022    Speciality Comments: Ophthalmologist-[facility] Samantha Dunnington  Procedures:  No procedures performed Allergies: Shellfish-derived products and Sulfonamide derivatives   Assessment / Plan:     Visit Diagnoses: Temporal arteritis ([place]) - Biopsy-proven with right eye vision loss.  Diagnosed October 2023.  She has been on a prednisone taper and currently taking prednisone 8 mg p.o. daily.  She is also on Actemra 162 mg subcu every 7 days.  She has been tolerating both medications well.  She recently was diagnosed with shingles and was treated by her PCP.  Most of the lesions are healed now.  She denies any history of headaches.  She had no temporal artery tenderness on the examination.  No muscular weakness or tenderness was noted.  She has not noticed any change in the vision since the last visit.  High risk medication use - Actemra 162 mg subcu every 7 days in combination with prednisone taper.  She is currently on prednisone 8 mg p.o. daily.  She will be reducing the dose of prednisone to 7 mg p.o. daily next week.  TB Gold was negative on September 26, 2022. - Plan: Lipid panel with the next labs.  Labs  obtained on February 06, 2023 CBC and CMP were normal.  She was advised to get labs every 3 months.  Information about immunization was placed in the AVS.  She was advised to hold Actemra if she develops an infection and resume after the infection resolves.  Long term (current) use of systemic steroids-she understands the long-term side effects of prednisone including hypertension, diabetes, weight gain, cataracts, osteoporosis.  Amaurosis fugax of right eye - Followed by Dr. Felix Ahmadi at [facility].  Minimal improvement was noted at the last visit per patient.  Vision has been stable.  Age-related osteoporosis without current pathological fracture - Zometa IV x 2 by the oncologist.  Last infusion February 2024.  Patient tolerated the infusion well.  Vitamin D deficiency-she remains on vitamin D.  Vitamin D was normal at 83 on September 26, 2022.  Primary hypertension-blood pressure was normal today.  Pure hypercholesterolemia -with next labs.  Plan: Lipid panel  Malignant neoplasm of upper-inner quadrant of left breast in female, estrogen receptor positive ([place]) - 2016 lumpectomy, LN resection, RTX and tamoxifen, followed by [facility] oncology.  Generalized anxiety disorder-she believes that anxiety is worse with the prednisone.  She is feeling better with reducing the dose of prednisone.  Orders: Orders Placed This Encounter  Procedures   Lipid panel   Meds ordered this encounter  Medications   predniSONE (DELTASONE) 5 MG tablet    Sig: Take1 tablet by mouth daily.    Dispense:  90 tablet    Refill:  0     Follow-Up Instructions: Return in about 3 months (around 05/18/2023) for Temporal arteritis.   Bo Merino, MD  Note - This record has been created using Editor, commissioning.  Chart creation errors have been sought, but may not always  have been located. Such creation errors do not reflect on  the standard of medical care.

## 2023-02-06 ENCOUNTER — Encounter: Payer: Self-pay | Admitting: Family Medicine

## 2023-02-06 ENCOUNTER — Ambulatory Visit: Payer: BC Managed Care – PPO | Admitting: Family Medicine

## 2023-02-06 VITALS — BP 145/60 | HR 62 | Temp 98.6°F | Ht 63.0 in | Wt 159.0 lb

## 2023-02-06 DIAGNOSIS — Z7952 Long term (current) use of systemic steroids: Secondary | ICD-10-CM | POA: Diagnosis not present

## 2023-02-06 DIAGNOSIS — H5461 Unqualified visual loss, right eye, normal vision left eye: Secondary | ICD-10-CM | POA: Diagnosis not present

## 2023-02-06 DIAGNOSIS — M316 Other giant cell arteritis: Secondary | ICD-10-CM | POA: Diagnosis not present

## 2023-02-06 DIAGNOSIS — Z79899 Other long term (current) drug therapy: Secondary | ICD-10-CM | POA: Diagnosis not present

## 2023-02-06 DIAGNOSIS — I10 Essential (primary) hypertension: Secondary | ICD-10-CM

## 2023-02-06 DIAGNOSIS — R6889 Other general symptoms and signs: Secondary | ICD-10-CM | POA: Diagnosis not present

## 2023-02-06 DIAGNOSIS — Z1211 Encounter for screening for malignant neoplasm of colon: Secondary | ICD-10-CM

## 2023-02-06 DIAGNOSIS — B029 Zoster without complications: Secondary | ICD-10-CM | POA: Diagnosis not present

## 2023-02-06 MED ORDER — VALACYCLOVIR HCL 1 G PO TABS: 1000.0000 mg | ORAL_TABLET | Freq: Three times a day (TID) | ORAL | 0 refills | Status: AC

## 2023-02-06 MED ORDER — HYDROCHLOROTHIAZIDE 25 MG PO TABS: 25.0000 mg | ORAL_TABLET | Freq: Every day | ORAL | 3 refills | Status: DC

## 2023-02-06 MED ORDER — LOSARTAN POTASSIUM 50 MG PO TABS: 50.0000 mg | ORAL_TABLET | Freq: Every day | ORAL | 3 refills | Status: DC

## 2023-02-06 NOTE — Progress Notes (Signed)
Subjective: [id number] follow up PCP: Janora Norlander, DO [id number] Cindy Blake is a 67 y.o. female presenting to clinic today for:  1.  Giant cell arteritis Patient continues to be treated with prednisone.  She is currently tapering from this.  She is also treated with Actemra.  Under the care of Dr. Estanislado Pandy.  Next office visit will be on the eighth.  She has noticed very slight progression of her vision.  She initially had no vision in the right eye but now is seeing some light and shadows.  She does voice some frustration and sadness over the fact that her vision has not gotten near her baseline.  However, she admits that Dr. Keturah Barre did say that it may be up to a year before she achieves good results.  She does not report any polydipsia, polyurea or blurry vision in the left eye with the use of prednisone.  Getting regular CBC and CMP's.  Her eye doctor did recommend that she consider occupational therapy to try and acclimate/accommodate the right vision loss.  She is interested in this referral  2. Rash Patient reports development of a rash on her right hand about 5 days ago.  She notes it started with some tingling and then she developed some vesicular formation appeared in a cluster.  She does not report any overt burning.  She started applying calamine lotion.  She does have a history of recurrent shingles but it is affected her left side of the face and eye before.  She infected think this could be shingles because it was on her hand but denies any exposure to plants like poison ivy or poison oak.  No new creams or lotions.  No other exposures to explain lesions.  No lesions elsewhere on the body.  3.  Gastroenterology referral Patient needs new gastroenterologist as her current gastroenterologist, Dr. Cheryle Horsfall is no longer in her network.  Does not report any rectal bleeding.  Her last colonoscopy had to be deferred because she is chronically treated with prednisone for the GCA right now.   They recommended waiting until she has come off of the prednisone before proceeding  4.  Hypertension Patient reports compliance with hydrochlorothiazide, losartan.  No chest pain or shortness of breath reported.  Her blood pressures have run anywhere from systolics of [id number].  Nothing over 90 diastolic wise.   ROS: Per HPI  Allergies  Allergen Reactions   Shellfish-Derived Products Hives   Sulfonamide Derivatives     REACTION: hives, itching, swelling   Past Medical History:  Diagnosis Date   Allergy    Cancer ([place])    Right Breast   Complication of anesthesia    Family history of adverse reaction to anesthesia    nausea   History of colon polyps    Hypertension    IBS (irritable bowel syndrome)    Mitral valve disorder    MVP (mitral valve prolapse)    PONV (postoperative nausea and vomiting)     Current Outpatient Medications:    Acetaminophen (TYLENOL PO), Take by mouth as needed., Disp: , Rfl:    ACTEMRA ACTPEN 162 MG/0.9ML SOAJ, INJECT 1 PEN UNDER THE SKIN EVERY 7 DAYS., Disp: 10.8 mL, Rfl: 0   ALPRAZolam (XANAX) 1 MG tablet, TAKE 1/2 TO 1 TABLET BID AS NEEDED FOR ANXIETY & SLEEP, Disp: 45 tablet, Rfl: 5   ascorbic acid (VITAMIN C) 500 MG tablet, Take 500 mg by mouth daily., Disp: , Rfl:    aspirin  81 MG tablet, Take 81 mg by mouth daily., Disp: , Rfl:    Calcium Carbonate (CALCIUM 600 PO), Take 600 mg by mouth daily., Disp: , Rfl:    cetirizine (ZYRTEC) 10 MG tablet, Take 10 mg by mouth every morning., Disp: , Rfl:    Cholecalciferol (VITAMIN D) 2000 UNITS CAPS, Take 2,000 Units by mouth every morning., Disp: , Rfl:    cyanocobalamin (VITAMIN B12) 1000 MCG tablet, Take 1,000 mcg by mouth daily., Disp: , Rfl:    diphenhydrAMINE (BENADRYL) 25 MG tablet, Take 25 mg by mouth every 6 (six) hours as needed (Congestion)., Disp: , Rfl:    fluticasone (FLONASE) 50 MCG/ACT nasal spray, SPRAY 2 SPRAYS INTO EACH NOSTRIL EVERY DAY, Disp: 48 mL, Rfl: 2   hydrochlorothiazide  (HYDRODIURIL) 25 MG tablet, TAKE 1 TABLET (25 MG TOTAL) BY MOUTH DAILY., Disp: 90 tablet, Rfl: 0   losartan (COZAAR) 50 MG tablet, Take 1 tablet (50 mg total) by mouth daily., Disp: 90 tablet, Rfl: 3   Multiple Vitamin (MULTIVITAMIN) tablet, Take 1 tablet by mouth daily., Disp: , Rfl:    potassium chloride (KLOR-CON M10) 10 MEQ tablet, TAKE 1 TABLET TWICE DAILY MON,WED,FRIDAY AND TAKE 1 TABLET DAILY ON TUE,THUR, SAT, AND SUNDAY, Disp: 120 tablet, Rfl: 0   predniSONE (DELTASONE) 1 MG tablet, Take 4 tabs po(along with '5mg'$  tab) x 2 weeks, 3 tabs po (along with '5mg'$  tab) x 2 weeks, 2 tabs po (along with '5mg'$  tab) x 2 weeks and 1 tab po(along with '5mg'$  tab)  x 2 weeks., Disp: 140 tablet, Rfl: 0   predniSONE (DELTASONE) 5 MG tablet, Take 2 tablets by mouth daily x 2 weeks, then taper by 1 mg every 2 weeks., Disp: 84 tablet, Rfl: 0   tamoxifen (NOLVADEX) 20 MG tablet, Take 20 mg by mouth at bedtime., Disp: , Rfl:    Zoledronic Acid (ZOMETA IV), Inject 4 mg into the vein every 6 (six) months. Last IV: 07/11/2022, Disp: , Rfl:  Social History   Socioeconomic History   Marital status: Married    Spouse name: Not on file   Number of children: Not on file   Years of education: Not on file   Highest education level: Not on file  Occupational History   Not on file  Tobacco Use   Smoking status: Never    Passive exposure: Never   Smokeless tobacco: Never  Vaping Use   Vaping Use: Never used  Substance and Sexual Activity   Alcohol use: Not Currently   Drug use: No   Sexual activity: Not on file  Other Topics Concern   Not on file  Social History Narrative   Not on file   Social Determinants of Health   Financial Resource Strain: Not on file  Food Insecurity: Not on file  Transportation Needs: Not on file  Physical Activity: Not on file  Stress: Not on file  Social Connections: Not on file  Intimate Partner Violence: Not on file   Family History  Problem Relation Age of Onset   Cancer Mother     Hypertension Father    Stroke Father    Heart attack Father    Arthritis Father    Hypertension Sister    Hypertension Sister    Lung cancer Brother    Multiple sclerosis Brother    Healthy Daughter     Objective: Office vital signs reviewed. BP (!) 145/60   Pulse 62   Temp 98.6 F (37 C)   Ht  $'5\' 3"'R$  (1.6 m)   Wt 159 lb (72.1 kg)   SpO2 100%   BMI 28.17 kg/m   Physical Examination:  General: Awake, alert, well nourished, moon facies. No acute distress HEENT: sclera white, MMM Cardio: regular rate and rhythm, S1S2 heard, no murmurs appreciated Pulm: clear to auscultation bilaterally, no wheezes, rhonchi or rales; normal work of breathing on room air GI: soft, non-tender, non-distended, bowel sounds present x4, no hepatomegaly, no splenomegaly, no masses Neuro: Sluggish pupillary reflex in the right eye Skin: Red, raised, patchy rash appreciated on the right dorsal hand opposite of the thenar eminence.  There is no appreciable vesicular formation now and no crusting  Assessment/ Plan: 67 y.o. female   GCA (giant cell arteritis) ([place]) - Plan: valACYclovir (VALTREX) 1000 MG tablet, Ambulatory referral to Occupational Therapy  Vision loss, right eye - Plan: Ambulatory referral to Occupational Therapy  High risk medication use - Started Actemra subcutaneous on October 04, 2022. - Plan: CMP14+EGFR, CBC with Differential/Platelet  Herpes zoster without complication - Plan: valACYclovir (VALTREX) 1000 MG tablet  Current chronic use of systemic steroids - Plan: Bayer DCA Hb A1c Waived, valACYclovir (VALTREX) 1000 MG tablet  Essential hypertension - Plan: hydrochlorothiazide (HYDRODIURIL) 25 MG tablet, losartan (COZAAR) 50 MG tablet  Screening for malignant neoplasm of colon - Plan: Ambulatory referral to Gastroenterology  Check A1c given use of steroids.  Referral placed to occupational therapy to aid with vision loss on the right and accommodations for such.  I did encourage  her to be patient with the medications and not lose faith.  Valtrex was given for what appears to be shingles breakout on the right hand.  She is past the 72-hour mark but given immunocompromise state with both the steroids, medication for GCA and use of tamoxifen for previous breast cancer I feel that she is high risk and warrants treatment  Blood pressure is borderline but technically in acceptable range for her age.  No changes today.  Her blood pressures at home are a bit better controlled at systolics as low as [id number].  I would hesitate to advance her medication to precipitate hypotension  Referral to gastroenterology in [place] placed.  Hopefully we can find somebody that is in her network so that she can get this colonoscopy arranged when she is done with prednisone    No orders of the defined types were placed in this encounter.  No orders of the defined types were placed in this encounter.    Janora Norlander, DO [place] [phone]

## 2023-02-07 LAB — CBC WITH DIFFERENTIAL/PLATELET
Basophils Absolute: 0 10*3/uL (ref 0.0–0.2)
Basos: 1 %
EOS (ABSOLUTE): 0 10*3/uL (ref 0.0–0.4)
Eos: 0 %
Hematocrit: 40 % (ref 34.0–46.6)
Hemoglobin: 13.5 g/dL (ref 11.1–15.9)
Immature Grans (Abs): 0 10*3/uL (ref 0.0–0.1)
Immature Granulocytes: 0 %
Lymphocytes Absolute: 0.9 10*3/uL (ref 0.7–3.1)
Lymphs: 16 %
MCH: 31.8 pg (ref 26.6–33.0)
MCHC: 33.8 g/dL (ref 31.5–35.7)
MCV: 94 fL (ref 79–97)
Monocytes Absolute: 0.3 10*3/uL (ref 0.1–0.9)
Monocytes: 6 %
Neutrophils Absolute: 4.7 10*3/uL (ref 1.4–7.0)
Neutrophils: 77 %
Platelets: 221 10*3/uL (ref 150–450)
RBC: 4.24 x10E6/uL (ref 3.77–5.28)
RDW: 11.9 % (ref 11.7–15.4)
WBC: 6.1 10*3/uL (ref 3.4–10.8)

## 2023-02-07 LAB — CMP14+EGFR
ALT: 16 IU/L (ref 0–32)
AST: 20 IU/L (ref 0–40)
Albumin/Globulin Ratio: 3.7 — ABNORMAL HIGH (ref 1.2–2.2)
Albumin: 4.8 g/dL (ref 3.9–4.9)
Alkaline Phosphatase: 42 IU/L — ABNORMAL LOW (ref 44–121)
BUN/Creatinine Ratio: 11 — ABNORMAL LOW (ref 12–28)
BUN: 9 mg/dL (ref 8–27)
Bilirubin Total: 0.7 mg/dL (ref 0.0–1.2)
CO2: 23 mmol/L (ref 20–29)
Calcium: 8.8 mg/dL (ref 8.7–10.3)
Chloride: 101 mmol/L (ref 96–106)
Creatinine, Ser: 0.85 mg/dL (ref 0.57–1.00)
Globulin, Total: 1.3 g/dL — ABNORMAL LOW (ref 1.5–4.5)
Glucose: 99 mg/dL (ref 70–99)
Potassium: 3.4 mmol/L — ABNORMAL LOW (ref 3.5–5.2)
Sodium: 143 mmol/L (ref 134–144)
Total Protein: 6.1 g/dL (ref 6.0–8.5)
eGFR: 76 mL/min/{1.73_m2} (ref >59)

## 2023-02-07 LAB — BAYER DCA HB A1C WAIVED: HB A1C (BAYER DCA - WAIVED): 5.6 % (ref 4.8–5.6)

## 2023-02-07 NOTE — Progress Notes (Signed)
Potassium is low.  Please notify patient and forward results to her PCP.  CBC is normal.

## 2023-02-08 DIAGNOSIS — H54415A Blindness right eye category 5, normal vision left eye: Secondary | ICD-10-CM | POA: Diagnosis not present

## 2023-02-08 DIAGNOSIS — H40012 Open angle with borderline findings, low risk, left eye: Secondary | ICD-10-CM | POA: Diagnosis not present

## 2023-02-08 DIAGNOSIS — H2513 Age-related nuclear cataract, bilateral: Secondary | ICD-10-CM | POA: Diagnosis not present

## 2023-02-08 DIAGNOSIS — H47011 Ischemic optic neuropathy, right eye: Secondary | ICD-10-CM | POA: Diagnosis not present

## 2023-02-15 ENCOUNTER — Ambulatory Visit: Payer: BC Managed Care – PPO | Attending: Rheumatology | Admitting: Rheumatology

## 2023-02-15 ENCOUNTER — Encounter: Payer: Self-pay | Admitting: Rheumatology

## 2023-02-15 VITALS — BP 138/65 | HR 73 | Resp 14 | Ht 63.0 in | Wt 160.6 lb

## 2023-02-15 DIAGNOSIS — I10 Essential (primary) hypertension: Secondary | ICD-10-CM

## 2023-02-15 DIAGNOSIS — M316 Other giant cell arteritis: Secondary | ICD-10-CM

## 2023-02-15 DIAGNOSIS — Z7952 Long term (current) use of systemic steroids: Secondary | ICD-10-CM

## 2023-02-15 DIAGNOSIS — Z79899 Other long term (current) drug therapy: Secondary | ICD-10-CM | POA: Diagnosis not present

## 2023-02-15 DIAGNOSIS — G453 Amaurosis fugax: Secondary | ICD-10-CM | POA: Diagnosis not present

## 2023-02-15 DIAGNOSIS — E559 Vitamin D deficiency, unspecified: Secondary | ICD-10-CM

## 2023-02-15 DIAGNOSIS — F411 Generalized anxiety disorder: Secondary | ICD-10-CM

## 2023-02-15 DIAGNOSIS — C50212 Malignant neoplasm of upper-inner quadrant of left female breast: Secondary | ICD-10-CM

## 2023-02-15 DIAGNOSIS — E7800 Pure hypercholesterolemia, unspecified: Secondary | ICD-10-CM

## 2023-02-15 DIAGNOSIS — Z170 Estrogen receptor positive status [ER+]: Secondary | ICD-10-CM

## 2023-02-15 DIAGNOSIS — M810 Age-related osteoporosis without current pathological fracture: Secondary | ICD-10-CM

## 2023-02-15 MED ORDER — PREDNISONE 5 MG PO TABS: ORAL_TABLET | ORAL | 0 refills | Status: DC

## 2023-02-15 NOTE — Patient Instructions (Addendum)
Standing Labs We placed an order today for your standing lab work.   Please have your standing labs drawn in May and every 3 months  Lipid panel with your next labs.  Please have your labs drawn 2 weeks prior to your appointment so that the provider can discuss your lab results at your appointment, if possible.  Please note that you may see your imaging and lab results in [facility] before we have reviewed them. We will contact you once all results are reviewed. Please allow our office up to 72 hours to thoroughly review all of the results before contacting the office for clarification of your results.  WALK-IN LAB HOURS  Monday through Thursday from 8:00 am -12:30 pm and 1:00 pm-5:00 pm and Friday from 8:00 am-12:00 pm.  Patients with office visits requiring labs will be seen before walk-in labs.  You may encounter longer than normal wait times. Please allow additional time. Wait times may be shorter on  Monday and Thursday afternoons.  We do not book appointments for walk-in labs. We appreciate your patience and understanding with our staff.   Labs are drawn by [facility]. Please bring your co-pay at the time of your lab draw.  You may receive a bill from [facility] for your lab work.  Please note if you are on Hydroxychloroquine and and an order has been placed for a Hydroxychloroquine level,  you will need to have it drawn 4 hours or more after your last dose.  If you wish to have your labs drawn at another location, please call the office 24 hours in advance so we can fax the orders.  The office is located at [address] [place], [place],  [address]   If you have any questions regarding directions or hours of operation,  please call [phone].   As a reminder, please drink plenty of water prior to coming for your lab work. Thanks!   Vaccines You are taking a medication(s) that can suppress your immune system.  The following immunizations are recommended: Flu  annually Covid-19  RSV Td/Tdap (tetanus, diphtheria, pertussis) every 10 years Pneumonia (Prevnar 15 then Pneumovax 23 at least 1 year apart.  Alternatively, can take Prevnar 20 without needing additional dose) Shingrix: 2 doses from 4 weeks to 6 months apart  Please check with your PCP to make sure you are up to date.   If you have signs or symptoms of an infection or start antibiotics: First, call your PCP for workup of your infection. Hold your medication through the infection, until you complete your antibiotics, and until symptoms resolve if you take the following: Injectable medication (Actemra, Benlysta, Cimzia, Cosentyx, Enbrel, Humira, Kevzara, Orencia, Remicade, Simponi, Stelara, Taltz, Tremfya) Methotrexate Leflunomide (Arava) Mycophenolate (Cellcept) Morrie Sheldon, Olumiant, or Rinvoq

## 2023-02-20 ENCOUNTER — Encounter: Payer: BC Managed Care – PPO | Admitting: Rehabilitative and Restorative Service Providers"

## 2023-02-27 ENCOUNTER — Ambulatory Visit: Payer: BC Managed Care – PPO | Admitting: Rehabilitative and Restorative Service Providers"

## 2023-03-05 ENCOUNTER — Other Ambulatory Visit: Payer: Self-pay | Admitting: Family Medicine

## 2023-03-05 DIAGNOSIS — E876 Hypokalemia: Secondary | ICD-10-CM

## 2023-03-07 ENCOUNTER — Other Ambulatory Visit: Payer: Self-pay | Admitting: Physician Assistant

## 2023-03-07 NOTE — Telephone Encounter (Signed)
Last Fill: 12/11/2022  Labs: 02/06/2023 Potassium is low. CBC is normal.   TB Gold: 09/26/2022 Neg    Next Visit: 05/31/2023  Last Visit: 02/15/2023  [id number] arteritis   Current Dose per office note 02/15/2023: Actemra 162 mg subcu every 7 days   Okay to refill Actemra?

## 2023-03-12 ENCOUNTER — Other Ambulatory Visit: Payer: Self-pay | Admitting: Rheumatology

## 2023-03-12 NOTE — Telephone Encounter (Signed)
Last Fill: 12/18/2022  Next Visit: 05/31/2023  Last Visit: 02/15/2023  Dx: Temporal arteritis   Current Dose per office note on 02/15/2023: She is currently on prednisone 8 mg p.o. daily. She will be reducing the dose of prednisone to 7 mg p.o. daily   Okay to refill Prednisone?

## 2023-03-25 ENCOUNTER — Encounter: Payer: Self-pay | Admitting: Occupational Therapy

## 2023-03-25 ENCOUNTER — Ambulatory Visit: Payer: BC Managed Care – PPO | Attending: Family Medicine | Admitting: Occupational Therapy

## 2023-03-25 DIAGNOSIS — M316 Other giant cell arteritis: Secondary | ICD-10-CM | POA: Diagnosis not present

## 2023-03-25 DIAGNOSIS — R41842 Visuospatial deficit: Secondary | ICD-10-CM | POA: Diagnosis not present

## 2023-03-25 DIAGNOSIS — H5461 Unqualified visual loss, right eye, normal vision left eye: Secondary | ICD-10-CM | POA: Diagnosis not present

## 2023-03-25 DIAGNOSIS — Z789 Other specified health status: Secondary | ICD-10-CM | POA: Diagnosis not present

## 2023-03-25 DIAGNOSIS — M25512 Pain in left shoulder: Secondary | ICD-10-CM | POA: Insufficient documentation

## 2023-03-25 NOTE — Therapy (Signed)
OUTPATIENT OCCUPATIONAL THERAPY LOW VISION EVALUATION  Patient Name: Cindy Blake MRN: [id number] DOB:[birth year], 67 y.o., female Today's Date: 03/25/2023  PCP: Raliegh Ip, DO  REFERRING PROVIDER: Raliegh Ip, DO  END OF SESSION:  OT End of Session - 03/25/23 1537     Visit Number 1    Number of Visits 7    Date for OT Re-Evaluation 05/10/23    Authorization Type BCBS - VL 60    OT Start Time 1535    OT Stop Time 1655    OT Time Calculation (min) 80 min    Activity Tolerance Patient tolerated treatment well    Behavior During Therapy WFL for tasks assessed/performed            Past Medical History:  Diagnosis Date   Allergy    Cancer    Right Breast   Complication of anesthesia    Family history of adverse reaction to anesthesia    nausea   History of colon polyps    Hypertension    IBS (irritable bowel syndrome)    Mitral valve disorder    MVP (mitral valve prolapse)    PONV (postoperative nausea and vomiting)    Past Surgical History:  Procedure Laterality Date   ABDOMINAL HYSTERECTOMY  age 31   APPENDECTOMY     ARTERY BIOPSY Right 09/21/2022   Procedure: Right BIOPSY TEMPORAL ARTERY;  Surgeon: Maeola Harman, MD;  Location: [facility] OR;  Service: Vascular;  Laterality: Right;   CHOLECYSTECTOMY     LASIK  2000   SHOULDER SURGERY Right    bone spur   Patient Active Problem List   Diagnosis Date Noted   Osteoporosis 12/08/2019   Malignant neoplasm of left female breast 05/01/2016   Cancer of upper-inner quadrant of female breast 02/01/2015   Generalized anxiety disorder 11/10/2013   Allergic rhinitis 11/10/2013   Hyperlipemia 07/06/2013   Hypertension 07/06/2013   Vitamin D deficiency 07/06/2013    ONSET DATE: 09/17/2022  REFERRING DIAG: M31.6 (ICD-10-CM) - GCA (giant cell arteritis) H54.61 (ICD-10-CM) - Vision loss, right eye  THERAPY DIAG:  Visuospatial deficit  Impaired instrumental activities of daily living  (IADL)  Rationale for Evaluation and Treatment: Rehabilitation  SUBJECTIVE:   SUBJECTIVE STATEMENT: ***Sees Dr. Hanley Ben (optometrist) with [facility]. She has been on Prednisone chronically with drastic change to cataract growth. Can see spreadsheet on computer but cannot see icons on toolbars. Uses various lubricating eye drops and allergy drops to try to keep visual impairments from going to her L eye.  Pt accompanied by: significant other; [name]  PERTINENT HISTORY: ***  PAIN:  Are you having pain? No  FALLS: Has patient fallen in last 6 months? 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 Has 2 monitors (1 screen for email and one for spreadsheets)  DEVICES: hand held mag  PHONE: Android - will expand items on screen as able  HAND DOMINANCE: Right  ADLs: Overall ADLs: mod I Transfers/ambulation related to ADLs:  IADLs: Light housekeeping: Difficulty with dusting ; avoided certain cleaning tasks Meal Prep: Min A Community mobility:  Has driven short, familiar places Medication management: mod I with pill organizer Handwriting:  back to or near prior level of function  MOBILITY STATUS: Independent  FUNCTIONAL OUTCOME MEASURES: PSFS:   COGNITION: Overall cognitive status: Within functional limits for tasks assessed  VISUAL FINDINGS: Baseline vision:  wears glasses as needed Visual history: cataracts - scheduled to be re-assessed on May 1; lasix eye surgery when she was 67 y.o.   Ocular ROM: WFL Visual Fields: Right visual field  deficits and LP in superior field Diplopia assessment: none Depth perception: on Right side of body Visual Acuity with glasses: Near - 20/20 with glasses; Distance - 20/25    OBSERVATIONS: ***   TODAY'S TREATMENT:                                                                                                                               N/a This date  PATIENT EDUCATION: Education details: OT Role and POC Person educated: Patient and Spouse Education method: Explanation  Education comprehension: verbalized understanding and needs further education  HOME EXERCISE PROGRAM: Not yet initiated    GOALS:  SHORT TERM GOALS: Target date: 04/22/2023    Patient will independently recall at least 2 compensatory strategies for visual impairment without cueing. Baseline: Goal status: INITIAL  2.  Patient will return demonstration of visual adaptation use and verbalize understanding of how to obtain item(s) if desired.  Baseline:  Goal status: INITIAL  3.  *** Baseline:  Goal status: {GOALSTATUS:25110}  4.  *** Baseline:  Goal status: {GOALSTATUS:25110}  5.  *** Baseline:  Goal status: {GOALSTATUS:25110}  6.  *** Baseline:  Goal status: {GOALSTATUS:25110}  LONG TERM GOALS: Target date: ***  *** Baseline:  Goal status: {GOALSTATUS:25110}  2.  *** Baseline:  Goal status: {GOALSTATUS:25110}  3.  *** Baseline:  Goal status: {GOALSTATUS:25110}  4.  *** Baseline:  Goal status: {GOALSTATUS:25110}  5.  *** Baseline:  Goal status: {GOALSTATUS:25110}  6.  *** Baseline:  Goal status: {GOALSTATUS:25110}  ASSESSMENT:  CLINICAL IMPRESSION: Patient is a 67 y.o. y.o. female who was seen today for occupational therapy evaluation for assessment of vision related impairments to ADLs and IADLs. Hx includes HLD, HTN, GAD, breast cancer, osteoporosis. Patient currently reports difficulty with depth perception, identifying items accurately, and returning to previous  occupations at [facility] as a part of functional deficits and impairments as noted below. Pt to benefit from skilled OT services for education on visual impairment(s), compensatory and safety strategies, and use of AD as needed for more independent and safe completion of daily occupations.   PERFORMANCE DEFICITS: in functional skills including ADLs, IADLs, mobility, and vision.   IMPAIRMENTS: are limiting patient from ADLs, IADLs, work, leisure, and social participation.   CO-MORBIDITIES: may have co-morbidities  that affects occupational performance. Patient will benefit from skilled OT to address above impairments and improve overall function.  MODIFICATION OR ASSISTANCE TO COMPLETE EVALUATION: Min-Moderate modification of tasks or assist with  assess necessary to complete an evaluation.  OT OCCUPATIONAL PROFILE AND HISTORY: Detailed assessment: Review of records and additional review of physical, cognitive, psychosocial history related to current functional performance.  CLINICAL DECISION MAKING: LOW - limited treatment options, no task modification necessary  REHAB POTENTIAL: Fair given visual impairements  EVALUATION COMPLEXITY: Low    PLAN:  OT FREQUENCY: 1x/week  OT DURATION: 6 weeks  PLANNED INTERVENTIONS: self care/ADL training, therapeutic exercise, therapeutic activity, functional mobility training, patient/family education, visual/perceptual remediation/compensation, coping strategies training, DME and/or AE instructions, and Re-evaluation  RECOMMENDED OTHER SERVICES: None at this time   CONSULTED AND AGREED WITH PLAN OF CARE: Patient and spouse  PLAN FOR NEXT SESSION: Go over low vision packet; scanning; device adaptions   Delana Meyer, OT 03/25/2023, 5:16 PM

## 2023-04-02 ENCOUNTER — Encounter: Payer: Self-pay | Admitting: Occupational Therapy

## 2023-04-02 ENCOUNTER — Ambulatory Visit: Payer: BC Managed Care – PPO | Admitting: Occupational Therapy

## 2023-04-02 DIAGNOSIS — M25512 Pain in left shoulder: Secondary | ICD-10-CM | POA: Diagnosis not present

## 2023-04-02 DIAGNOSIS — H5461 Unqualified visual loss, right eye, normal vision left eye: Secondary | ICD-10-CM | POA: Diagnosis not present

## 2023-04-02 DIAGNOSIS — Z789 Other specified health status: Secondary | ICD-10-CM | POA: Diagnosis not present

## 2023-04-02 DIAGNOSIS — M316 Other giant cell arteritis: Secondary | ICD-10-CM | POA: Diagnosis not present

## 2023-04-02 DIAGNOSIS — R41842 Visuospatial deficit: Secondary | ICD-10-CM | POA: Diagnosis not present
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
## 2023-04-10 ENCOUNTER — Ambulatory Visit: Payer: BC Managed Care – PPO | Attending: Family Medicine | Admitting: Occupational Therapy

## 2023-04-10 DIAGNOSIS — M25512 Pain in left shoulder: Secondary | ICD-10-CM | POA: Insufficient documentation

## 2023-04-10 DIAGNOSIS — H40012 Open angle with borderline findings, low risk, left eye: Secondary | ICD-10-CM | POA: Diagnosis not present

## 2023-04-10 DIAGNOSIS — H47011 Ischemic optic neuropathy, right eye: Secondary | ICD-10-CM | POA: Diagnosis not present

## 2023-04-10 DIAGNOSIS — H54415A Blindness right eye category 5, normal vision left eye: Secondary | ICD-10-CM | POA: Diagnosis not present

## 2023-04-10 DIAGNOSIS — Z789 Other specified health status: Secondary | ICD-10-CM | POA: Insufficient documentation

## 2023-04-10 DIAGNOSIS — R41842 Visuospatial deficit: Secondary | ICD-10-CM | POA: Insufficient documentation

## 2023-04-10 DIAGNOSIS — H2513 Age-related nuclear cataract, bilateral: Secondary | ICD-10-CM | POA: Diagnosis not present
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
## 2023-04-16 ENCOUNTER — Ambulatory Visit: Payer: BC Managed Care – PPO | Admitting: Occupational Therapy

## 2023-04-23 ENCOUNTER — Ambulatory Visit: Payer: BC Managed Care – PPO | Admitting: Occupational Therapy

## 2023-04-23 DIAGNOSIS — R41842 Visuospatial deficit: Secondary | ICD-10-CM

## 2023-04-23 DIAGNOSIS — Z789 Other specified health status: Secondary | ICD-10-CM

## 2023-04-23 DIAGNOSIS — M25512 Pain in left shoulder: Secondary | ICD-10-CM
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
## 2023-04-30 ENCOUNTER — Ambulatory Visit: Payer: BC Managed Care – PPO | Admitting: Occupational Therapy

## 2023-05-03 NOTE — Therapy (Unsigned)
OUTPATIENT OCCUPATIONAL THERAPY LOW VISION TREATMENT  Patient Name: Cindy Blake MRN: [id number] DOB:04-Mar-1956, 67 y.o., female Today's Date: 05/03/2023  PCP: Raliegh Ip, DO  REFERRING PROVIDER: Raliegh Ip, DO  END OF SESSION:   Past Medical History:  Diagnosis Date   Allergy    Cancer (HCC)    Right Breast   Complication of anesthesia    Family history of adverse reaction to anesthesia    nausea   History of colon polyps    Hypertension    IBS (irritable bowel syndrome)    Mitral valve disorder    MVP (mitral valve prolapse)    PONV (postoperative nausea and vomiting)    Past Surgical History:  Procedure Laterality Date   ABDOMINAL HYSTERECTOMY  age 48   APPENDECTOMY     ARTERY BIOPSY Right 09/21/2022   Procedure: Right BIOPSY TEMPORAL ARTERY;  Surgeon: Maeola Harman, MD;  Location: [facility] OR;  Service: Vascular;  Laterality: Right;   CHOLECYSTECTOMY     LASIK  2000   SHOULDER SURGERY Right    bone spur   Patient Active Problem List   Diagnosis Date Noted   Osteoporosis 12/08/2019   Malignant neoplasm of left female breast (HCC) 05/01/2016   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  Rationale for Evaluation and Treatment: Rehabilitation  SUBJECTIVE:   SUBJECTIVE STATEMENT: [name] had a TIA and was in the hospital until Sunday. She was able to drive to the hospital and even back into a parking spot.   Pt accompanied by: significant other; [name]  PERTINENT HISTORY: "[name] is a 67 y.o. female who presents as a  new patient for sinus and ear concerns. She began infusions of Zometa for osteoporosis in February 2023; no side effects noted at that time. Six months later in early August, she received her second injection and began experiencing pain/pressure in the jaw (TMJ bilaterally), the back of her neck and back of the head. She saw her PCP; diagnosed with bilateral middle ear effusions and acute maxillary sinusitis. Prescribed Augmentin and Flonase. Symptoms persisted; she completed Doxycycline and prednisone. The jaw symptom has improved; still has some discomfort in the neck. Her ears feel okay; no decreased hearing during this time."  "She states she contacted her oncologist who referred her to ENT physician who did the ear examination which was normal. He also did CT scan of the sinuses which was normal per patient. She was advised to see a TMJ dentist. She was evaluated by the dentist who felt there was some wear-and-tear in the TMJ joints. On September 15, 2022 she noticed floaters in her right eye and the following day she noticed cloudy vision. October 9 she noticed complete loss of vision in her right eye and the headache resolved. She was seen an ophthalmologist with [facility] who suspected temporal arteritis and placed her on prednisone 80 mg p.o. daily which she started on October 10. She still has complete loss of vision in her right eye. She states her light left eye feels blurred. She was evaluated by Dr. Durwin Nora on October 13 and had temporal  artery biopsy on the right side. The biopsy results came positive for temporal arteritis. "  "Giant cell arteritis Patient continues to be treated with prednisone.  She is currently tapering from this.  She is also treated with Actemra.  Under the care of Dr. Corliss Skains.  Next office visit will be on the eighth.  She has noticed very slight progression of her vision.  She initially had no vision in the right eye but now is seeing some light and shadows.  She does voice  some frustration and sadness over the fact that her vision has not gotten near her baseline.  However, she admits that Dr. Algis Downs did say that it may be up to a year before she achieves good results.  She does not report any polydipsia, polyurea or blurry vision in the left eye with the use of prednisone.  Getting regular CBC and CMP's.  Her eye doctor did recommend that she consider occupational therapy to try and acclimate/accommodate the right vision loss.  She is interested in this referral"  PAIN:  Are you having pain? No  FALLS: Has patient fallen in last 6 months? 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  Has 2 monitors (1 screen for email and one for spreadsheets) Side indicators on Jeep  DEVICES: hand held mag  PHONE: Android - will expand items on screen as able  HAND DOMINANCE: Right  ADLs: Overall ADLs: mod I Transfers/ambulation related to ADLs:  IADLs: Light housekeeping: Difficulty with dusting ; avoided certain cleaning tasks Meal Prep: Min A Community mobility: Has driven short, familiar places Medication management: mod I with pill organizer Handwriting:  back to or near prior level of function  MOBILITY STATUS: Independent  FUNCTIONAL OUTCOME MEASURES: PSFS:    Total score = sum of the activity scores/number of activities Minimum detectable change (90%CI) for average score = 2 points Minimum detectable change (90%CI) for single activity score = 3 points   COGNITION: Overall  cognitive status: Within functional limits for tasks assessed  VISUAL FINDINGS: Baseline vision:  wears glasses as needed Visual history: cataracts - scheduled to be re-assessed on May 1; lasix eye surgery when she was 67 y.o.   Ocular ROM: WFL Visual Fields: Right visual field deficits and LP in superior field Diplopia assessment: none Depth perception: on Right side of body Visual Acuity with glasses: Near - 20/20 with glasses; Distance - 20/25   OBSERVATIONS: Pt ambulates without use of AD. No loss of balance. The pt appears well kept and has glasses donned.   TODAY'S TREATMENT:                                                                                                                               -  Self-care/home management completed for duration as noted below including: OT educated pt on use of blind spot elimination method for side mirrors to help improve driving safety. Education also provided on panoramic rear-view mirror for additional viewing abilities.  Pt completed medium peg drops into boom-whacker at various planes and distances to help with depth perception impairments.  PATIENT EDUCATION: Education details: completed low vision recommendations Person educated: Patient and Spouse Education method: Explanation, demonstrate, handouts  Education comprehension: verbalized understanding, return demonstration, and needs further education  HOME EXERCISE PROGRAM: Not yet initiated   GOALS:  LONG TERM GOALS: Target date: 05/10/2023    Patient will independently recall at least 2 compensatory strategies for visual impairment without cueing. Baseline: Goal status: MET  2.  Patient will return demonstration of visual adaptation use and verbalize understanding of how to obtain item(s) if desired.  Baseline:  Goal status: IN PROGRESS  3.  Patient will report at least two-point increase in average PSFS score or at least three-point increase in a single activity score  indicating functionally significant improvement given minimum detectable change.   Baseline: 3.7 total score (See above for individual activity scores) Goal status: IN PROGRESS   ASSESSMENT:  CLINICAL IMPRESSION: Pt reporting and demonstrating good carry-over of therapy recommendations to improve independence and safety with daily tasks. She continues to benefit from skilled services to address remaining occupational barriers.   PERFORMANCE DEFICITS: in functional skills including ADLs, IADLs, mobility, and vision.   IMPAIRMENTS: are limiting patient from ADLs, IADLs, work, leisure, and social participation.   CO-MORBIDITIES: may have co-morbidities  that affects occupational performance. Patient will benefit from skilled OT to address above impairments and improve overall function.  REHAB POTENTIAL: Fair given visual impairements  PLAN:  OT FREQUENCY: 1x/week  OT DURATION: 6 weeks  PLANNED INTERVENTIONS: self care/ADL training, therapeutic exercise, therapeutic activity, functional mobility training, patient/family education, visual/perceptual remediation/compensation, coping strategies training, DME and/or AE instructions, and Re-evaluation  RECOMMENDED OTHER SERVICES: None at this time   CONSULTED AND AGREED WITH PLAN OF CARE: Patient and spouse  PLAN FOR NEXT SESSION: review scanning; device adaptations; How is side mirror adjustment; depth perception   Delana Meyer, OT 05/03/2023, 3:42 PM

## 2023-05-07 ENCOUNTER — Ambulatory Visit: Payer: BC Managed Care – PPO | Admitting: Occupational Therapy

## 2023-05-07 DIAGNOSIS — Z789 Other specified health status: Secondary | ICD-10-CM | POA: Diagnosis not present

## 2023-05-07 DIAGNOSIS — R41842 Visuospatial deficit: Secondary | ICD-10-CM

## 2023-05-07 DIAGNOSIS — M25512 Pain in left shoulder: Secondary | ICD-10-CM | POA: Diagnosis not present

## 2023-05-10 ENCOUNTER — Ambulatory Visit: Payer: BC Managed Care – PPO | Admitting: Family Medicine

## 2023-05-10 ENCOUNTER — Encounter: Payer: Self-pay | Admitting: Family Medicine

## 2023-05-10 VITALS — BP 137/79 | HR 73 | Temp 98.3°F | Ht 63.0 in | Wt 161.0 lb

## 2023-05-10 DIAGNOSIS — I10 Essential (primary) hypertension: Secondary | ICD-10-CM

## 2023-05-10 DIAGNOSIS — F411 Generalized anxiety disorder: Secondary | ICD-10-CM

## 2023-05-10 DIAGNOSIS — M316 Other giant cell arteritis: Secondary | ICD-10-CM | POA: Diagnosis not present

## 2023-05-10 DIAGNOSIS — E7800 Pure hypercholesterolemia, unspecified: Secondary | ICD-10-CM | POA: Diagnosis not present

## 2023-05-10 DIAGNOSIS — Z79899 Other long term (current) drug therapy: Secondary | ICD-10-CM

## 2023-05-10 MED ORDER — ALPRAZOLAM 1 MG PO TABS: ORAL_TABLET | ORAL | 5 refills | Status: DC

## 2023-05-10 NOTE — Addendum Note (Signed)
Addended byViviann Spare on: 05/10/2023 04:10 PM   Modules accepted: Orders

## 2023-05-10 NOTE — Progress Notes (Signed)
Subjective: CC:GCA, GAD PCP: Raliegh Ip, DO ZOX:[id number] Cindy Blake is a 67 y.o. female presenting to clinic today for:  1. GCA Continues to struggle with blindness in her right eye.  She is able to see some light but nothing really has been very clear on that side.  She has plan for cataract extraction next year and is closely monitored for that.  She does not have to follow-up with her eye doctor until February of next year.  Has weaned down to about 7 mg of prednisone and continues to get the injection q7 days  2. GAD Has had more anxiety as of late.  She reports to me that the family that was in the news recently for double homicide/suicide was the child of her best friend.  They are going to be burying her soon.  Her spirit has subsequently been heavy and she has been utilizing her Xanax pretty regularly.  She still only rarely uses it during the daytime and will be ready for transition whenever I think this is appropriate.  She denies any memory changes, falls, respiratory depression or visual auditory hallucinations   ROS: Per HPI  Allergies  Allergen Reactions   Shellfish-Derived Products Hives   Sulfonamide Derivatives     REACTION: hives, itching, swelling   Past Medical History:  Diagnosis Date   Allergy    Cancer (HCC)    Right Breast   Complication of anesthesia    Family history of adverse reaction to anesthesia    nausea   History of colon polyps    Hypertension    IBS (irritable bowel syndrome)    Mitral valve disorder    MVP (mitral valve prolapse)    PONV (postoperative nausea and vomiting)     Current Outpatient Medications:    Acetaminophen (TYLENOL PO), Take by mouth as needed., Disp: , Rfl:    ACTEMRA ACTPEN 162 MG/0.9ML SOAJ, INJECT 1 PEN UNDER THE SKIN EVERY 7 DAYS, Disp: 10.8 mL, Rfl: 0   ALPRAZolam (XANAX) 1 MG tablet, TAKE 1/2 TO 1 TABLET BID AS NEEDED FOR ANXIETY & SLEEP, Disp: 45 tablet, Rfl: 5   ascorbic acid (VITAMIN C) 500 MG tablet,  Take 500 mg by mouth daily., Disp: , Rfl:    aspirin 81 MG tablet, Take 81 mg by mouth daily., Disp: , Rfl:    Calcium Carbonate (CALCIUM 600 PO), Take 600 mg by mouth daily., Disp: , Rfl:    cetirizine (ZYRTEC) 10 MG tablet, Take 10 mg by mouth every morning., Disp: , Rfl:    Cholecalciferol (VITAMIN D) 2000 UNITS CAPS, Take 2,000 Units by mouth every morning., Disp: , Rfl:    cyanocobalamin (VITAMIN B12) 1000 MCG tablet, Take 1,000 mcg by mouth daily., Disp: , Rfl:    diphenhydrAMINE (BENADRYL) 25 MG tablet, Take 25 mg by mouth every 6 (six) hours as needed (Congestion)., Disp: , Rfl:    fluticasone (FLONASE) 50 MCG/ACT nasal spray, SPRAY 2 SPRAYS INTO EACH NOSTRIL EVERY DAY, Disp: 48 mL, Rfl: 2   hydrochlorothiazide (HYDRODIURIL) 25 MG tablet, Take 1 tablet (25 mg total) by mouth daily., Disp: 90 tablet, Rfl: 3   losartan (COZAAR) 50 MG tablet, Take 1 tablet (50 mg total) by mouth daily., Disp: 90 tablet, Rfl: 3   Multiple Vitamin (MULTIVITAMIN) tablet, Take 1 tablet by mouth daily., Disp: , Rfl:    potassium chloride (KLOR-CON M10) 10 MEQ tablet, TAKE 1 TABLET TWICE DAILY MON,WED,FRIDAY AND TAKE 1 TABLET DAILY ON TUE,THUR, SAT,  AND SUNDAY, Disp: 120 tablet, Rfl: 1   predniSONE (DELTASONE) 1 MG tablet, Take 2 tablets (2 mg total) by mouth daily with breakfast. Take along with 5 mg tablet., Disp: 180 tablet, Rfl: 0   tamoxifen (NOLVADEX) 20 MG tablet, Take 20 mg by mouth at bedtime., Disp: , Rfl:    Zoledronic Acid (ZOMETA IV), Inject 4 mg into the vein every 6 (six) months. Last IV: 07/11/2022, Disp: , Rfl:  Social History   Socioeconomic History   Marital status: Married    Spouse name: Not on file   Number of children: Not on file   Years of education: Not on file   Highest education level: Some college, no degree  Occupational History   Not on file  Tobacco Use   Smoking status: Never    Passive exposure: Never   Smokeless tobacco: Never  Vaping Use   Vaping Use: Never used   Substance and Sexual Activity   Alcohol use: Not Currently   Drug use: No   Sexual activity: Not on file  Other Topics Concern   Not on file  Social History Narrative   Not on file   Social Determinants of Health   Financial Resource Strain: Low Risk  (05/06/2023)   Overall Financial Resource Strain (CARDIA)    Difficulty of Paying Living Expenses: Not hard at all  Food Insecurity: No Food Insecurity (05/06/2023)   Hunger Vital Sign    Worried About Running Out of Food in the Last Year: Never true    Ran Out of Food in the Last Year: Never true  Transportation Needs: No Transportation Needs (05/06/2023)   PRAPARE - Administrator, Civil Service (Medical): No    Lack of Transportation (Non-Medical): No  Physical Activity: Insufficiently Active (05/06/2023)   Exercise Vital Sign    Days of Exercise per Week: 3 days    Minutes of Exercise per Session: 30 min  Stress: No Stress Concern Present (05/06/2023)   [employer] of Occupational Health - Occupational Stress Questionnaire    Feeling of Stress : Not at all  Social Connections: Moderately Integrated (05/06/2023)   Social Connection and Isolation Panel [NHANES]    Frequency of Communication with Friends and Family: More than three times a week    Frequency of Social Gatherings with Friends and Family: Three times a week    Attends Religious Services: More than 4 times per year    Active Member of Clubs or Organizations: No    Attends Engineer, structural: Not on file    Marital Status: Married  Catering manager Violence: Not on file   Family History  Problem Relation Age of Onset   Cancer Mother    Hypertension Father    Stroke Father    Heart attack Father    Arthritis Father    Hypertension Sister    Hypertension Sister    Lung cancer Brother    Multiple sclerosis Brother    Healthy Daughter     Objective: Office vital signs reviewed. BP 137/79   Pulse 73   Temp 98.3 F (36.8 C)   Ht  5\' 3"  (1.6 m)   Wt 161 lb (73 kg)   SpO2 100%   BMI 28.52 kg/m   Physical Examination:  General: Awake, alert, well nourished, No acute distress HEENT:  sclera white, MMM Cardio: regular rate and rhythm, S1S2 heard, no murmurs appreciated Pulm: clear to auscultation bilaterally, no wheezes, rhonchi or rales; normal work of  breathing on room air Psych: Mood stable, speech normal.  She is somewhat tearful when talking about her friend and the other recent changes/stressors     02/06/2023    3:19 PM 10/10/2022    4:36 PM 07/27/2022   10:37 AM  Depression screen PHQ 2/9  Decreased Interest 0 0 0  Down, Depressed, Hopeless 0 0 0  PHQ - 2 Score 0 0 0  Altered sleeping 0  0  Tired, decreased energy 0  0  Change in appetite 0  0  Feeling bad or failure about yourself  0  0  Trouble concentrating 0  0  Moving slowly or fidgety/restless 0  0  Suicidal thoughts 0  0  PHQ-9 Score 0  0  Difficult doing work/chores Not difficult at all  Not difficult at all      02/06/2023    3:19 PM 10/10/2022    4:36 PM 07/27/2022   10:37 AM 04/09/2022    3:25 PM  GAD 7 : Generalized Anxiety Score  Nervous, Anxious, on Edge 0 0 0 0  Control/stop worrying 0 0 0 0  Worry too much - different things 0 0 0 0  Trouble relaxing 0 0 0   Restless 0 0 0 0  Easily annoyed or irritable 0 0 0 0  Afraid - awful might happen 0 0 0 0  Total GAD 7 Score 0 0 0   Anxiety Difficulty Not difficult at all Not difficult at all Not difficult at all Not difficult at all    Assessment/ Plan: 67 y.o. female   GCA (giant cell arteritis) (HCC)  Generalized anxiety disorder - Plan: ALPRAZolam (XANAX) 1 MG tablet  Chronic prescription benzodiazepine use  Essential hypertension  GCA is stable at this time.  Continue follow-up as directed with specialist.  Labs were collected today for her specialist  She is up-to-date on UDS and CSC.  National narcotic database reviewed and there were no red flags.  I am interested in  getting her weaned from the alprazolam given its risk, particularly now that she is over 14.  We discussed that I think that given recent events this is probably not the best time to start this wean so we will wait until our next visit in 3 to 4 months.  I am glad to transition her over to an alternative for sleep if she is interested in this and I think that it may be worth at some point seeing a therapist especially if she still has ongoing stressors or difficulty coping with recent events  Blood pressure is controlled.  No changes needed  No orders of the defined types were placed in this encounter.  No orders of the defined types were placed in this encounter.  Total time spent with patient 28 minutes.  Greater than 50% of encounter spent in coordination of care/counseling.   Raliegh Ip, DO [facility] [phone]

## 2023-05-11 LAB — CBC WITH DIFFERENTIAL/PLATELET
Basophils Absolute: 0 10*3/uL (ref 0.0–0.2)
Basos: 1 %
EOS (ABSOLUTE): 0 10*3/uL (ref 0.0–0.4)
Eos: 1 %
Hematocrit: 35.7 % (ref 34.0–46.6)
Hemoglobin: 12.5 g/dL (ref 11.1–15.9)
Immature Grans (Abs): 0 10*3/uL (ref 0.0–0.1)
Immature Granulocytes: 0 %
Lymphocytes Absolute: 1.4 10*3/uL (ref 0.7–3.1)
Lymphs: 31 %
MCH: 32.5 pg (ref 26.6–33.0)
MCHC: 35 g/dL (ref 31.5–35.7)
MCV: 93 fL (ref 79–97)
Monocytes Absolute: 0.4 10*3/uL (ref 0.1–0.9)
Monocytes: 8 %
Neutrophils Absolute: 2.6 10*3/uL (ref 1.4–7.0)
Neutrophils: 59 %
Platelets: 171 10*3/uL (ref 150–450)
RBC: 3.85 x10E6/uL (ref 3.77–5.28)
RDW: 12.6 % (ref 11.7–15.4)
WBC: 4.5 10*3/uL (ref 3.4–10.8)

## 2023-05-11 LAB — LIPID PANEL
Chol/HDL Ratio: 2.5 ratio (ref 0.0–4.4)
Cholesterol, Total: 221 mg/dL — ABNORMAL HIGH (ref 100–199)
HDL: 87 mg/dL (ref >39)
LDL Chol Calc (NIH): 113 mg/dL — ABNORMAL HIGH (ref 0–99)
Triglycerides: 121 mg/dL (ref 0–149)
VLDL Cholesterol Cal: 21 mg/dL (ref 5–40)

## 2023-05-11 LAB — CMP14+EGFR
ALT: 19 IU/L (ref 0–32)
AST: 26 IU/L (ref 0–40)
Albumin/Globulin Ratio: 3.2 — ABNORMAL HIGH (ref 1.2–2.2)
Albumin: 4.5 g/dL (ref 3.9–4.9)
Alkaline Phosphatase: 44 IU/L (ref 44–121)
BUN/Creatinine Ratio: 11 — ABNORMAL LOW (ref 12–28)
BUN: 9 mg/dL (ref 8–27)
Bilirubin Total: 0.6 mg/dL (ref 0.0–1.2)
CO2: 23 mmol/L (ref 20–29)
Calcium: 9 mg/dL (ref 8.7–10.3)
Chloride: 101 mmol/L (ref 96–106)
Creatinine, Ser: 0.85 mg/dL (ref 0.57–1.00)
Globulin, Total: 1.4 g/dL — ABNORMAL LOW (ref 1.5–4.5)
Glucose: 81 mg/dL (ref 70–99)
Potassium: 3.5 mmol/L (ref 3.5–5.2)
Sodium: 142 mmol/L (ref 134–144)
Total Protein: 5.9 g/dL — ABNORMAL LOW (ref 6.0–8.5)
eGFR: 76 mL/min/{1.73_m2} (ref >59)

## 2023-05-12 NOTE — Progress Notes (Signed)
LDL is elevated at 113, CMP is stable.  CBC is normal.  Please notify patient and forward results to her PCP.

## 2023-05-13 ENCOUNTER — Telehealth: Payer: Self-pay

## 2023-05-13 NOTE — Telephone Encounter (Signed)
Patient contacted the office and states she just started taking her 1 MG prednisone and that she will be taking that for two weeks. Patient states she just wanted to call to be sure that after finishing the 1 MG Prednisone she will not be taking the prednisone anymore. Patient call back number is [phone]. Please advise.

## 2023-05-13 NOTE — Telephone Encounter (Signed)
Reviewed office visit note from 02/15/23-no mention of prednisone taper recommendations.   Please advise

## 2023-05-14 NOTE — Telephone Encounter (Signed)
I called patient and advised her to stay on prednisone 1 mg p.o. daily until her follow-up visit.  Patient is asymptomatic.

## 2023-05-16 DIAGNOSIS — M25511 Pain in right shoulder: Secondary | ICD-10-CM | POA: Diagnosis not present

## 2023-05-27 ENCOUNTER — Other Ambulatory Visit: Payer: Self-pay

## 2023-05-27 ENCOUNTER — Ambulatory Visit: Payer: BC Managed Care – PPO | Attending: Orthopedic Surgery | Admitting: Physical Therapy

## 2023-05-27 ENCOUNTER — Encounter: Payer: Self-pay | Admitting: Physical Therapy

## 2023-05-27 ENCOUNTER — Other Ambulatory Visit: Payer: Self-pay | Admitting: Physician Assistant

## 2023-05-27 DIAGNOSIS — M62838 Other muscle spasm: Secondary | ICD-10-CM | POA: Diagnosis not present

## 2023-05-27 DIAGNOSIS — M25511 Pain in right shoulder: Secondary | ICD-10-CM | POA: Diagnosis not present

## 2023-05-27 DIAGNOSIS — M6281 Muscle weakness (generalized): Secondary | ICD-10-CM | POA: Diagnosis not present

## 2023-05-27 NOTE — Telephone Encounter (Signed)
Last Fill: 03/07/2023  Labs: 05/10/2023 CMP is stable.  CBC is normal   TB Gold:  09/26/2022 Neg    Next Visit: 06/20/2023  Last Visit: 02/15/2023  DX: Temporal arteritis   Current Dose per office note 02/15/2023: Actemra 162 mg subcu every 7 days   Okay to refill Actemra?

## 2023-05-27 NOTE — Therapy (Signed)
OUTPATIENT PHYSICAL THERAPY SHOULDER EVALUATION   Patient Name: Cindy Blake MRN: [id number] DOB:08-31-1956, 67 y.o., female Today's Date: 05/27/2023  END OF SESSION:  PT End of Session - 05/27/23 1506     Visit Number 1    Number of Visits 12    Date for PT Re-Evaluation 06/24/23    Authorization Type FOTO AT LEAST EVERY 5TH VISIT.  PROGRESS NOTE AT 10TH VISIT.  KX MODIFIER AFTER 15 VISITS.    PT Start Time 0140    PT Stop Time 0228    PT Time Calculation (min) 48 min    Activity Tolerance Patient tolerated treatment well    Behavior During Therapy WFL for tasks assessed/performed             Past Medical History:  Diagnosis Date   Allergy    Cancer (HCC)    Right Breast   Complication of anesthesia    Family history of adverse reaction to anesthesia    nausea   History of colon polyps    Hypertension    IBS (irritable bowel syndrome)    Mitral valve disorder    MVP (mitral valve prolapse)    PONV (postoperative nausea and vomiting)    Past Surgical History:  Procedure Laterality Date   ABDOMINAL HYSTERECTOMY  age 27   APPENDECTOMY     ARTERY BIOPSY Right 09/21/2022   Procedure: Right BIOPSY TEMPORAL ARTERY;  Surgeon: Maeola Harman, MD;  Location: [facility] OR;  Service: Vascular;  Laterality: Right;   CHOLECYSTECTOMY     LASIK  2000   SHOULDER SURGERY Right    bone spur   Patient Active Problem List   Diagnosis Date Noted   Osteoporosis 12/08/2019   Malignant neoplasm of left female breast (HCC) 05/01/2016   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  THERAPY DIAG:  Acute pain of right shoulder  Muscle weakness (generalized)  Other muscle spasm  Rationale for Evaluation  and Treatment: Rehabilitation  ONSET DATE: ~2-3 months ago.  SUBJECTIVE:                                                                                                                                                                                      SUBJECTIVE STATEMENT: The patient presents to the clinic with c/o right shoulder pain that came on with no known injury about 2-3 months ago.  A recent injection was very  helpful.  She has had pain with attempting to sleep on her right side and carry things (ie:  Pocketbook) on her right shoulder.    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  PAIN:  Are you having pain? Yes: NPRS scale: 2/10 Pain location: Right shoulder. Pain description: Ache and sore. Aggravating factors: Sleeping on right shoulder, carrying something on her right shoulder. Relieving factors: Rest.  Recent injection.  PRECAUTIONS: None  WEIGHT BEARING RESTRICTIONS: No  FALLS:  Has patient fallen in last 6 months? No  LIVING ENVIRONMENT: Lives in: House/apartment Has following equipment at home: None   PLOF: Independent  PATIENT GOALS:Not have right shoulder pain.  NEXT MD VISIT:   OBJECTIVE:   PATIENT SURVEYS:  FOTO .  POSTURE: Forward head and rounded shoulders.  UPPER EXTREMITY ROM:  Full right shoulder AROM.  UPPER EXTREMITY MMT:  Right shoulder ER is 4/5.  SHOULDER SPECIAL TESTS: No significant pain reproduction with impingement testing of right shoulder.  Normal UE DTR's.  PALPATION:  Tender to palpation with increased tone over right posterior cuff musculature.   TODAY'S TREATMENT:                                                                                                                                         DATE: HMP and IFC at 80-150 Hz on 40% scan x 20 minutes to patient's left posterior cuff musculature.   Normal modality response following removal of modality.  PATIENT  EDUCATION: Education details: As below. Person educated: Patient Education method: Explanation, Demonstration, and Handouts Education comprehension: verbalized understanding  HOME EXERCISE PROGRAM: HOME EXERCISE PROGRAM Created by [place] Hanni Milford Jun 17th, 2024 View at [email or web address] using code: [email or web address]  Page 1 of 1 1 Exercise external Rotation Sidelying - Lie on uninvolved side with the involved shoulder lying on folded towel at side - Keep the elbow bent and arm against towel throughout this exercise. - Lift your wrist and forearm away from your abdomen. Slowly lower - Perform this motion only to a point that is pain-free. Repeat 15 Times Hold 2 Seconds Complete 2 Sets Perform 1 Time a Day  ASSESSMENT:  CLINICAL IMPRESSION: The patient presents to OPPT with c/o right shoulder pain.  A recent injection has been very helpful.  She has normal right shoulder AROM.  She has some weakness into ER.  She had no significant pain reproduction with Impingement testing.  She is tender to palpation over her right posterior cuff musculature with increased tone.  Pain has increased with attempts at lying on her right shoulder and carrying object over her shoulder.  Patient will benefit from skilled physical therapy intervention to address pain and deficits.   OBJECTIVE IMPAIRMENTS: decreased activity tolerance, decreased strength, increased muscle spasms, and pain.   ACTIVITY LIMITATIONS: carrying and reach over head  PARTICIPATION LIMITATIONS: laundry  PERSONAL FACTORS:  1 comorbidity: prior right shoulder surgery  are also affecting patient's functional outcome.   REHAB POTENTIAL: Excellent  CLINICAL DECISION MAKING: Stable/uncomplicated  EVALUATION COMPLEXITY: Low   GOALS:  SHORT TERM GOALS: Target date: 06/24/23  Ind with a HEP. Goal status: INITIAL  2.  Perform ADL's with right shoulder pain not > 2/10.  Goal status: INITIAL  3.  Patient able to sleep on right  shoulder.  Goal status: INITIAL  4.  Carry pocketbook over right shoulder.  Goal status: INITIAL  PLAN:  PT FREQUENCY:  2-3 times a week  PT DURATION: 4 weeks  PLANNED INTERVENTIONS: Therapeutic exercises, Therapeutic activity, Gait training, Patient/Family education, Self Care, Dry Needling, Electrical stimulation, Cryotherapy, Moist heat, Vasopneumatic device, Ultrasound, and Manual therapy  PLAN FOR NEXT SESSION: Review HEP, RW4, Combo e'stim/US, STW/M.   Demarkus Remmel, [place], PT 05/27/2023, 3:28 PM

## 2023-05-29 ENCOUNTER — Ambulatory Visit: Payer: BC Managed Care – PPO | Admitting: Physical Therapy

## 2023-05-29 DIAGNOSIS — M62838 Other muscle spasm: Secondary | ICD-10-CM

## 2023-05-29 DIAGNOSIS — M6281 Muscle weakness (generalized): Secondary | ICD-10-CM | POA: Diagnosis not present

## 2023-05-29 DIAGNOSIS — M25511 Pain in right shoulder: Secondary | ICD-10-CM

## 2023-05-29 NOTE — Therapy (Signed)
OUTPATIENT PHYSICAL THERAPY SHOULDER TREATMENT  Patient Name: Cindy Blake MRN: [id number] DOB:1956/10/17, 67 y.o., female Today's Date: 05/29/2023  END OF SESSION:  PT End of Session - 05/29/23 1422     Visit Number 2    Number of Visits 12    Date for PT Re-Evaluation 06/24/23    Authorization Type FOTO AT LEAST EVERY 5TH VISIT.  PROGRESS NOTE AT 10TH VISIT.  KX MODIFIER AFTER 15 VISITS.    PT Start Time 0138    PT Stop Time 0231    PT Time Calculation (min) 53 min    Activity Tolerance Patient tolerated treatment well    Behavior During Therapy WFL for tasks assessed/performed             Past Medical History:  Diagnosis Date   Allergy    Cancer (HCC)    Right Breast   Complication of anesthesia    Family history of adverse reaction to anesthesia    nausea   History of colon polyps    Hypertension    IBS (irritable bowel syndrome)    Mitral valve disorder    MVP (mitral valve prolapse)    PONV (postoperative nausea and vomiting)    Past Surgical History:  Procedure Laterality Date   ABDOMINAL HYSTERECTOMY  age 22   APPENDECTOMY     ARTERY BIOPSY Right 09/21/2022   Procedure: Right BIOPSY TEMPORAL ARTERY;  Surgeon: Maeola Harman, MD;  Location: [facility] OR;  Service: Vascular;  Laterality: Right;   CHOLECYSTECTOMY     LASIK  2000   SHOULDER SURGERY Right    bone spur   Patient Active Problem List   Diagnosis Date Noted   Osteoporosis 12/08/2019   Malignant neoplasm of left female breast (HCC) 05/01/2016   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  THERAPY DIAG:  Acute pain of right shoulder  Muscle weakness (generalized)  Other muscle spasm  Rationale for Evaluation and  Treatment: Rehabilitation  ONSET DATE: ~2-3 months ago.  SUBJECTIVE:                                                                                                                                                                                      SUBJECTIVE STATEMENT: No new complaints.  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  PAIN:  Are you having pain? Yes: NPRS scale: 2/10  Pain location: Right shoulder. Pain description: Ache and sore. Aggravating factors: Sleeping on right shoulder, carrying something on her right shoulder. Relieving factors: Rest.  Recent injection.  PRECAUTIONS: None  WEIGHT BEARING RESTRICTIONS: No  FALLS:  Has patient fallen in last 6 months? No  LIVING ENVIRONMENT: Lives in: House/apartment Has following equipment at home: None   PLOF: Independent  PATIENT GOALS:Not have right shoulder pain.  NEXT MD VISIT:   OBJECTIVE:   PATIENT SURVEYS:  FOTO .  POSTURE: Forward head and rounded shoulders.  UPPER EXTREMITY ROM:  Full right shoulder AROM.  UPPER EXTREMITY MMT:  Right shoulder ER is 4/5.  SHOULDER SPECIAL TESTS: No significant pain reproduction with impingement testing of right shoulder.  Normal UE DTR's.  PALPATION:  Tender to palpation with increased tone over right posterior cuff musculature.   TODAY'S TREATMENT:                                                                                                                                         DATE: Combo e'stim/US at 1.50 W/CM2 x 12 minutes f/b STW/M x 12 minutes utilizing ischemic release technique f/b HMP and IFC at 80-150 Hz on 40% scan x 20 minutes to patient's left posterior cuff musculature.   Normal modality response following removal of modality.  PATIENT EDUCATION: Education details: As below. Person educated: Patient Education method: Explanation, Demonstration, and Handouts Education comprehension:  verbalized understanding  HOME EXERCISE PROGRAM: HOME EXERCISE PROGRAM Created by [place] Karman Biswell Jun 17th, 2024 View at [email or web address] using code: [email or web address]  Page 1 of 1 1 Exercise external Rotation Sidelying - Lie on uninvolved side with the involved shoulder lying on folded towel at side - Keep the elbow bent and arm against towel throughout this exercise. - Lift your wrist and forearm away from your abdomen. Slowly lower - Perform this motion only to a point that is pain-free. Repeat 15 Times Hold 2 Seconds Complete 2 Sets Perform 1 Time a Day  ASSESSMENT:  CLINICAL IMPRESSION: Patient with increased tone over her right posterior cuff musculature with good response to STW/M today.   OBJECTIVE IMPAIRMENTS: decreased activity tolerance, decreased strength, increased muscle spasms, and pain.   ACTIVITY LIMITATIONS: carrying and reach over head  PARTICIPATION LIMITATIONS: laundry  PERSONAL FACTORS: 1 comorbidity: prior right shoulder surgery  are also affecting patient's functional outcome.   REHAB POTENTIAL: Excellent  CLINICAL DECISION MAKING: Stable/uncomplicated  EVALUATION COMPLEXITY: Low   GOALS:  SHORT TERM GOALS: Target date: 06/24/23  Ind with a HEP. Goal status: INITIAL  2.  Perform ADL's with right shoulder pain not > 2/10.  Goal status: INITIAL  3.  Patient able to sleep on right shoulder.  Goal status: INITIAL  4.  Carry pocketbook over right shoulder.  Goal status: INITIAL  PLAN:  PT FREQUENCY:  2-3 times a week  PT DURATION: 4 weeks  PLANNED INTERVENTIONS: Therapeutic exercises,  Therapeutic activity, Gait training, Patient/Family education, Self Care, Dry Needling, Electrical stimulation, Cryotherapy, Moist heat, Vasopneumatic device, Ultrasound, and Manual therapy  PLAN FOR NEXT SESSION: Review HEP, RW4, Combo e'stim/US, STW/M.   Genevive Printup, [place], PT 05/29/2023, 2:35 PM

## 2023-05-31 ENCOUNTER — Ambulatory Visit: Payer: BC Managed Care – PPO | Admitting: Rheumatology

## 2023-06-03 ENCOUNTER — Telehealth: Payer: Self-pay

## 2023-06-03 NOTE — Telephone Encounter (Signed)
Patient contacted the office and states she was trying to get a refill of her Actemra and was told by [facility] that she was out of refills. After reviewing the chart, advised the patient a 3 month supply of Actemra was sent in to [facility] on 05/27/2023. Advised patient to call the pharmacy back and to tell them we have sent in the refill about a week ago. Patient verbalized understanding.

## 2023-06-04 ENCOUNTER — Ambulatory Visit: Payer: BC Managed Care – PPO | Admitting: Physical Therapy

## 2023-06-04 DIAGNOSIS — M25511 Pain in right shoulder: Secondary | ICD-10-CM

## 2023-06-04 DIAGNOSIS — M62838 Other muscle spasm: Secondary | ICD-10-CM | POA: Diagnosis not present

## 2023-06-04 DIAGNOSIS — M6281 Muscle weakness (generalized): Secondary | ICD-10-CM

## 2023-06-04 NOTE — Therapy (Signed)
OUTPATIENT PHYSICAL THERAPY SHOULDER TREATMENT  Patient Name: Cindy Blake MRN: [id number] DOB:16-Apr-1956, 67 y.o., female Today's Date: 06/04/2023  END OF SESSION:  PT End of Session - 06/04/23 1042     Visit Number 3    Number of Visits 12    Date for PT Re-Evaluation 06/24/23    Authorization Type FOTO AT LEAST EVERY 5TH VISIT.  PROGRESS NOTE AT 10TH VISIT.  KX MODIFIER AFTER 15 VISITS.    PT Start Time 0845    PT Stop Time 0941    PT Time Calculation (min) 56 min    Activity Tolerance Patient tolerated treatment well    Behavior During Therapy WFL for tasks assessed/performed             Past Medical History:  Diagnosis Date   Allergy    Cancer (HCC)    Right Breast   Complication of anesthesia    Family history of adverse reaction to anesthesia    nausea   History of colon polyps    Hypertension    IBS (irritable bowel syndrome)    Mitral valve disorder    MVP (mitral valve prolapse)    PONV (postoperative nausea and vomiting)    Past Surgical History:  Procedure Laterality Date   ABDOMINAL HYSTERECTOMY  age 74   APPENDECTOMY     ARTERY BIOPSY Right 09/21/2022   Procedure: Right BIOPSY TEMPORAL ARTERY;  Surgeon: Maeola Harman, MD;  Location: [facility] OR;  Service: Vascular;  Laterality: Right;   CHOLECYSTECTOMY     LASIK  2000   SHOULDER SURGERY Right    bone spur   Patient Active Problem List   Diagnosis Date Noted   Osteoporosis 12/08/2019   Malignant neoplasm of left female breast (HCC) 05/01/2016   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  THERAPY DIAG:  Acute pain of right shoulder  Muscle weakness (generalized)  Other muscle spasm  Rationale for Evaluation and  Treatment: Rehabilitation  ONSET DATE: ~2-3 months ago.  SUBJECTIVE:                                                                                                                                                                                      SUBJECTIVE STATEMENT: Much better after last treatment.  Was able to plant flowers.  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  PAIN:  Are you having pain? Yes: NPRS scale: 2/10 Pain location: Right shoulder. Pain description: Ache and sore. Aggravating factors: Sleeping on right shoulder, carrying something on her right shoulder. Relieving factors: Rest.  Recent injection.  PRECAUTIONS: None  WEIGHT BEARING RESTRICTIONS: No  FALLS:  Has patient fallen in last 6 months? No  LIVING ENVIRONMENT: Lives in: House/apartment Has following equipment at home: None   PLOF: Independent  PATIENT GOALS:Not have right shoulder pain.  NEXT MD VISIT:   OBJECTIVE:   PATIENT SURVEYS:  FOTO .  POSTURE: Forward head and rounded shoulders.  UPPER EXTREMITY ROM:  Full right shoulder AROM.  UPPER EXTREMITY MMT:  Right shoulder ER is 4/5.  SHOULDER SPECIAL TESTS: No significant pain reproduction with impingement testing of right shoulder.  Normal UE DTR's.  PALPATION:  Tender to palpation with increased tone over right posterior cuff musculature.   TODAY'S TREATMENT:                                                                                                                                         DATE: UBE x 6 minutes at Combo e'stim/US at 1.50 W/CM2 x 8 minutes f/b STW/M x 9 minutes utilizing ischemic release technique f/b HMP and IFC at 80-150 Hz on 40% scan x 20 minutes to patient's left posterior cuff musculature.   Normal modality response following removal of modality.  PATIENT EDUCATION: Education details: As below. Person educated: Patient Education method: Explanation,  Demonstration, and Handouts Education comprehension: verbalized understanding  HOME EXERCISE PROGRAM: HOME EXERCISE PROGRAM Created by [place] Kent Riendeau Jun 17th, 2024 View at [email or web address] using code: [email or web address]  Page 1 of 1 1 Exercise external Rotation Sidelying - Lie on uninvolved side with the involved shoulder lying on folded towel at side - Keep the elbow bent and arm against towel throughout this exercise. - Lift your wrist and forearm away from your abdomen. Slowly lower - Perform this motion only to a point that is pain-free. Repeat 15 Times Hold 2 Seconds Complete 2 Sets Perform 1 Time a Day  ASSESSMENT:  CLINICAL IMPRESSION: Patient responding very well to treatments.  Decreased pain and increased function reported.    OBJECTIVE IMPAIRMENTS: decreased activity tolerance, decreased strength, increased muscle spasms, and pain.   ACTIVITY LIMITATIONS: carrying and reach over head  PARTICIPATION LIMITATIONS: laundry  PERSONAL FACTORS: 1 comorbidity: prior right shoulder surgery  are also affecting patient's functional outcome.   REHAB POTENTIAL: Excellent  CLINICAL DECISION MAKING: Stable/uncomplicated  EVALUATION COMPLEXITY: Low   GOALS:  SHORT TERM GOALS: Target date: 06/24/23  Ind with a HEP. Goal status: INITIAL  2.  Perform ADL's with right shoulder pain not > 2/10.  Goal status: INITIAL  3.  Patient able to sleep on right shoulder.  Goal status: INITIAL  4.  Carry pocketbook over right shoulder.  Goal status: INITIAL  PLAN:  PT FREQUENCY:  2-3 times a  week  PT DURATION: 4 weeks  PLANNED INTERVENTIONS: Therapeutic exercises, Therapeutic activity, Gait training, Patient/Family education, Self Care, Dry Needling, Electrical stimulation, Cryotherapy, Moist heat, Vasopneumatic device, Ultrasound, and Manual therapy  PLAN FOR NEXT SESSION: Review HEP, RW4, Combo e'stim/US, STW/M.   Sumire Halbleib, [place], PT 06/04/2023, 10:49 AM

## 2023-06-06 ENCOUNTER — Ambulatory Visit: Payer: BC Managed Care – PPO | Admitting: Physical Therapy

## 2023-06-06 DIAGNOSIS — M25511 Pain in right shoulder: Secondary | ICD-10-CM | POA: Diagnosis not present

## 2023-06-06 DIAGNOSIS — M62838 Other muscle spasm: Secondary | ICD-10-CM

## 2023-06-06 DIAGNOSIS — M6281 Muscle weakness (generalized): Secondary | ICD-10-CM | POA: Diagnosis not present

## 2023-06-06 NOTE — Therapy (Signed)
OUTPATIENT PHYSICAL THERAPY SHOULDER TREATMENT  Patient Name: Cindy Blake MRN: [id number] DOB:1956/05/28, 67 y.o., female Today's Date: 06/06/2023  END OF SESSION:  PT End of Session - 06/06/23 1548     Visit Number 4    Number of Visits 12    Date for PT Re-Evaluation 06/24/23    Authorization Type FOTO AT LEAST EVERY 5TH VISIT.  PROGRESS NOTE AT 10TH VISIT.  KX MODIFIER AFTER 15 VISITS.    PT Start Time 0230    PT Stop Time 0326    PT Time Calculation (min) 56 min    Activity Tolerance Patient tolerated treatment well    Behavior During Therapy WFL for tasks assessed/performed             Past Medical History:  Diagnosis Date   Allergy    Cancer (HCC)    Right Breast   Complication of anesthesia    Family history of adverse reaction to anesthesia    nausea   History of colon polyps    Hypertension    IBS (irritable bowel syndrome)    Mitral valve disorder    MVP (mitral valve prolapse)    PONV (postoperative nausea and vomiting)    Past Surgical History:  Procedure Laterality Date   ABDOMINAL HYSTERECTOMY  age 18   APPENDECTOMY     ARTERY BIOPSY Right 09/21/2022   Procedure: Right BIOPSY TEMPORAL ARTERY;  Surgeon: Maeola Harman, MD;  Location: [facility] OR;  Service: Vascular;  Laterality: Right;   CHOLECYSTECTOMY     LASIK  2000   SHOULDER SURGERY Right    bone spur   Patient Active Problem List   Diagnosis Date Noted   Osteoporosis 12/08/2019   Malignant neoplasm of left female breast (HCC) 05/01/2016   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  THERAPY DIAG:  Acute pain of right shoulder  Muscle weakness (generalized)  Other muscle spasm  Rationale for Evaluation and  Treatment: Rehabilitation  ONSET DATE: ~2-3 months ago.  SUBJECTIVE:                                                                                                                                                                                      SUBJECTIVE STATEMENT: Shoulder feeling better.  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  PAIN:  Are you having pain? Yes: NPRS scale: 2/10  Pain location: Right shoulder. Pain description: Ache and sore. Aggravating factors: Sleeping on right shoulder, carrying something on her right shoulder. Relieving factors: Rest.  Recent injection.  PRECAUTIONS: None  WEIGHT BEARING RESTRICTIONS: No  FALLS:  Has patient fallen in last 6 months? No  LIVING ENVIRONMENT: Lives in: House/apartment Has following equipment at home: None   PLOF: Independent  PATIENT GOALS:Not have right shoulder pain.  NEXT MD VISIT:   OBJECTIVE:   PATIENT SURVEYS:  FOTO .  POSTURE: Forward head and rounded shoulders.  UPPER EXTREMITY ROM:  Full right shoulder AROM.  UPPER EXTREMITY MMT:  Right shoulder ER is 4/5.  SHOULDER SPECIAL TESTS: No significant pain reproduction with impingement testing of right shoulder.  Normal UE DTR's.  PALPATION:  Tender to palpation with increased tone over right posterior cuff musculature.   TODAY'S TREATMENT:                                                                                                                                         DATE: UBE x 8 minutes f/b RW4 with yellow theraband f/b Combo e'stim/US at 1.50 W/CM2 x 8 minutes f/b STW/M x 5 minutes utilizing ischemic release technique f/b HMP and IFC at 80-150 Hz on 40% scan x 20 minutes to patient's left posterior cuff musculature.   Normal modality response following removal of modality.  PATIENT EDUCATION: Education details: As below. Person educated: Patient Education method: Explanation,  Demonstration, and Handouts Education comprehension: verbalized understanding  HOME EXERCISE PROGRAM: HOME EXERCISE PROGRAM Created by [place] Clarie Camey Jun 17th, 2024 View at [email or web address] using code: [email or web address]  Page 1 of 1 1 Exercise external Rotation Sidelying - Lie on uninvolved side with the involved shoulder lying on folded towel at side - Keep the elbow bent and arm against towel throughout this exercise. - Lift your wrist and forearm away from your abdomen. Slowly lower - Perform this motion only to a point that is pain-free. Repeat 15 Times Hold 2 Seconds Complete 2 Sets Perform 1 Time a Day  ASSESSMENT:  CLINICAL IMPRESSION: Patient making excellent progress and did very well with theraband resisted RW4 exercise.  She remembered these from last year when she came to PT for her left shoulder.  She still has the handout of the exercises and yellow and red theraband at home.  OBJECTIVE IMPAIRMENTS: decreased activity tolerance, decreased strength, increased muscle spasms, and pain.   ACTIVITY LIMITATIONS: carrying and reach over head  PARTICIPATION LIMITATIONS: laundry  PERSONAL FACTORS: 1 comorbidity: prior right shoulder surgery  are also affecting patient's functional outcome.   REHAB POTENTIAL: Excellent  CLINICAL DECISION MAKING: Stable/uncomplicated  EVALUATION COMPLEXITY: Low   GOALS:  SHORT TERM GOALS: Target date: 06/24/23  Ind with a HEP. Goal status: INITIAL  2.  Perform ADL's with right shoulder pain not > 2/10.  Goal status: INITIAL  3.  Patient able to sleep on  right shoulder.  Goal status: INITIAL  4.  Carry pocketbook over right shoulder.  Goal status: INITIAL  PLAN:  PT FREQUENCY:  2-3 times a week  PT DURATION: 4 weeks  PLANNED INTERVENTIONS: Therapeutic exercises, Therapeutic activity, Gait training, Patient/Family education, Self Care, Dry Needling, Electrical stimulation, Cryotherapy, Moist heat, Vasopneumatic device,  Ultrasound, and Manual therapy  PLAN FOR NEXT SESSION: Review HEP, RW4, Combo e'stim/US, STW/M.   Maeve Debord, [place], PT 06/06/2023, 3:53 PM

## 2023-06-07 NOTE — Progress Notes (Signed)
Office Visit Note  Patient: Cindy Blake             Date of Birth: [birth year]           MRN: [id number]             PCP: Raliegh Ip, DO Referring: Raliegh Ip, DO Visit Date: 06/20/2023 Occupation: @GUAROCC @  Subjective:  Medication management  History of Present Illness: Cindy Blake is a 67 y.o. female with temporal arteritis returns today after her last visit in March 2024.  Patient states she has been taking prednisone 1 mg p.o. daily since the last month.  She has been taking Actemra injections weekly without any interruptions.  She has not had any temporal headaches.  She has had some sensitivity in the back of her scalp while combing her hair intermittently.  She has noticed some perception of shadow with her right eye.  She has not started driving.  She gets Zometa IV infusions every 6 months through her oncologist.  She has been taking calcium and vitamin D.  She denies any joint pain or joint swelling.    Activities of Daily Living:  Patient reports morning stiffness for 0  none .   Patient Denies nocturnal pain.  Difficulty dressing/grooming: Denies Difficulty climbing stairs: Denies Difficulty getting out of chair: Denies Difficulty using hands for taps, buttons, cutlery, and/or writing: Denies  Review of Systems  Constitutional:  Negative for fatigue.  HENT:  Negative for mouth sores and mouth dryness.   Eyes:  Positive for visual disturbance and dryness.  Respiratory:  Negative for shortness of breath.   Cardiovascular:  Negative for chest pain and palpitations.  Gastrointestinal:  Negative for blood in stool, constipation and diarrhea.  Endocrine: Negative for increased urination.  Genitourinary:  Negative for involuntary urination.  Musculoskeletal:  Positive for myalgias and myalgias. Negative for joint pain, gait problem, joint pain, joint swelling, muscle weakness, morning stiffness and muscle tenderness.  Skin:  Positive for hair loss.  Negative for color change, rash and sensitivity to sunlight.  Allergic/Immunologic: Negative for susceptible to infections.  Neurological:  Negative for dizziness and headaches.  Hematological:  Negative for swollen glands.  Psychiatric/Behavioral:  Negative for depressed mood and sleep disturbance. The patient is nervous/anxious.     PMFS History:  Patient Active Problem List   Diagnosis Date Noted   Osteoporosis 12/08/2019   Malignant neoplasm of left female breast (HCC) 05/01/2016   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) 02/01/2015   Generalized anxiety disorder 11/10/2013   Allergic rhinitis 11/10/2013   Hyperlipemia 07/06/2013   Hypertension 07/06/2013   Vitamin D deficiency 07/06/2013    Past Medical History:  Diagnosis Date   Allergy    Cancer (HCC)    Right Breast   Complication of anesthesia    Family history of adverse reaction to anesthesia    nausea   History of colon polyps    Hypertension    IBS (irritable bowel syndrome)    Mitral valve disorder    MVP (mitral valve prolapse)    PONV (postoperative nausea and vomiting)     Family History  Problem Relation Age of Onset   Cancer Mother    Hypertension Father    Stroke Father    Heart attack Father    Arthritis Father    Hypertension Sister    Hypertension Sister    Lung cancer Brother    Multiple sclerosis Brother    Healthy Daughter  Past Surgical History:  Procedure Laterality Date   ABDOMINAL HYSTERECTOMY  age 87   APPENDECTOMY     ARTERY BIOPSY Right 09/21/2022   Procedure: Right BIOPSY TEMPORAL ARTERY;  Surgeon: Maeola Harman, MD;  Location: [facility] OR;  Service: Vascular;  Laterality: Right;   CHOLECYSTECTOMY     LASIK  2000   SHOULDER SURGERY Right    bone spur   Social History   Social History Narrative   Not on file   Immunization History  Administered Date(s) Administered   Covid-19, Mrna,Vaccine(Spikevax)60yrs and older 12/24/2022   Fluad Quad(high Dose  65+) 10/10/2022   Influenza Inj Mdck Quad With Preservative 09/19/2020   Influenza,inj,Quad PF,6+ Mos 10/11/2015, 09/03/2016, 09/26/2017, 10/08/2018, 10/05/2019   Influenza-Unspecified 09/22/2020, 09/27/2021   PFIZER(Purple Top)SARS-COV-2 Vaccination 02/14/2020, 03/06/2020, 09/30/2020, 09/08/2021   Pneumococcal Conjugate-13 01/09/2017   Pneumococcal Polysaccharide-23 07/07/2021   Tdap 06/05/2018   Zoster Recombinant(Shingrix) 07/07/2021, 10/10/2021     Objective: Vital Signs: BP (!) 147/69 (BP Location: Left Arm, Patient Position: Sitting, Cuff Size: Normal)   Pulse (!) 56   Resp 15   Ht 5\' 3"  (1.6 m)   Wt 158 lb (71.7 kg)   BMI 27.99 kg/m    Physical Exam Vitals and nursing note reviewed.  Constitutional:      Appearance: She is well-developed.  HENT:     Head: Normocephalic and atraumatic.  Eyes:     Conjunctiva/sclera: Conjunctivae normal.  Cardiovascular:     Rate and Rhythm: Normal rate and regular rhythm.     Heart sounds: Normal heart sounds.  Pulmonary:     Effort: Pulmonary effort is normal.     Breath sounds: Normal breath sounds.  Abdominal:     General: Bowel sounds are normal.     Palpations: Abdomen is soft.  Musculoskeletal:     Cervical back: Normal range of motion.  Lymphadenopathy:     Cervical: No cervical adenopathy.  Skin:    General: Skin is warm and dry.     Capillary Refill: Capillary refill takes less than 2 seconds.  Neurological:     Mental Status: She is alert and oriented to person, place, and time.     Comments: Patient had no temporal artery tenderness.  Psychiatric:        Behavior: Behavior normal.      Musculoskeletal Exam: Cervical, thoracic and lumbar spine were in good range of motion.  She had no temporal artery tenderness.  Shoulders, elbows, wrist, MCPs PIPs and DIPs were in good range of motion with no synovitis.  Hip joints and knee joints were in good range of motion without any warmth swelling or effusion.  There was no  tenderness over ankles or MTPs.  CDAI Exam: CDAI Score: -- Patient Global: --; Provider Global: -- Swollen: --; Tender: -- Joint Exam 06/20/2023   No joint exam has been documented for this visit   There is currently no information documented on the homunculus. Go to the Rheumatology activity and complete the homunculus joint exam.  Investigation: No additional findings.  Imaging: No results found.  Recent Labs: Lab Results  Component Value Date   WBC 4.5 05/10/2023   HGB 12.5 05/10/2023   PLT 171 05/10/2023   NA 142 05/10/2023   K 3.5 05/10/2023   CL 101 05/10/2023   CO2 23 05/10/2023   GLUCOSE 81 05/10/2023   BUN 9 05/10/2023   CREATININE 0.85 05/10/2023   BILITOT 0.6 05/10/2023   ALKPHOS 44 05/10/2023   AST 26  05/10/2023   ALT 19 05/10/2023   PROT 5.9 (L) 05/10/2023   ALBUMIN 4.5 05/10/2023   CALCIUM 9.0 05/10/2023   GFRAA 99 07/06/2020   QFTBGOLDPLUS NEGATIVE 09/26/2022    Speciality Comments: Ophthalmologist-[facility] Samantha Dunnington  Procedures:  No procedures performed Allergies: Shellfish-derived products and Sulfonamide derivatives   Assessment / Plan:     Visit Diagnoses: Temporal arteritis (HCC) - Biopsy-proven with right eye vision loss.  Diagnosed October 2023. -Patient has been on prednisone taper and has been taking prednisone 1 mg p.o. daily for the last month.  She continues to be on Actemra 162 mg subcu weekly without any interruption.  She denies any side effects on Actemra.  She has not had any recurrence of headaches.  She has had some scalp tenderness in the occipital region intermittently.  No temporal artery tenderness was noted on the examination today.  No muscular weakness was noted.  She has noticed some visualization of shadows with her right eye.  Plan: Sedimentation rate with next labs.  High risk medication use - Actemra 162 mg subcu every 7 days in combination with prednisone taper.  She is currently on prednisone 1 mg p.o.  daily.  Labs obtained on May 10 2023 CBC and CMP were normal.  TB Gold was negative on September 26, 2022.  She was advised to get labs in August and then every 3 months to monitor for drug toxicity.  She was advised to get TB Gold in November 2024.  Information on immunization was placed in the AVS.  She was advised to stop Actemra if she develops an infection and resume it when the infection resolves.  Long term (current) use of systemic steroids-she is aware of the long-term side effects of steroid use.  She has been on tapering dose.  She was advised to come off prednisone completely.  Patient would prefer to take prednisone 1 mg every other day for 1 month before discontinuing it.  Amaurosis fugax of right eye - Followed by Dr. Hanley Ben at [facility] eye care.  Her next appointment is in March next year per patient.  Age-related osteoporosis without current pathological fracture - Zometa IV x 2 by the oncologist.  Last infusion February 2024.  Patient tolerated the infusion well.  Vitamin D deficiency - 09/21/22 Vit D 83  Has been taking vitamin D.  Primary hypertension-blood pressure was 158/71 and repeat blood pressure was 147/69.  Patient states her blood pressure has been running normal at home.  She was advised to continue to monitor blood pressure closely.  Pure hypercholesterolemia-dietary modifications exercise and muscle strengthening was discussed.  Generalized anxiety disorder-she takes Xanax and as needed basis.  Malignant neoplasm of upper-inner quadrant of left breast in female, estrogen receptor positive (HCC) - 2016 lumpectomy, LN resection, RTX and tamoxifen, followed by [facility] oncology.  Orders: Orders Placed This Encounter  Procedures   Sedimentation rate   No orders of the defined types were placed in this encounter.    Follow-Up Instructions: Return in about 3 months (around 09/20/2023) for Temporal arteritis.   Pollyann Savoy, MD  Note - This record has been  created using Animal nutritionist.  Chart creation errors have been sought, but may not always  have been located. Such creation errors do not reflect on  the standard of medical care.

## 2023-06-08 ENCOUNTER — Other Ambulatory Visit: Payer: Self-pay | Admitting: Rheumatology

## 2023-06-10 NOTE — Telephone Encounter (Signed)
Last Fill: 03/12/2023  Next Visit: 06/20/2023  Last Visit: 02/15/2023   Dx: Temporal arteritis   Current Dose per office note on 02/15/2023: She is currently on prednisone 8 mg p.o. daily. She will be reducing the dose of prednisone to 7 mg p.o. daily   Patient states she is taking Prednisone 1 mg daily.   Okay to refill Prednisone?

## 2023-06-11 ENCOUNTER — Ambulatory Visit: Payer: BC Managed Care – PPO | Attending: Orthopedic Surgery | Admitting: *Deleted

## 2023-06-11 ENCOUNTER — Encounter: Payer: Self-pay | Admitting: *Deleted

## 2023-06-11 DIAGNOSIS — M25511 Pain in right shoulder: Secondary | ICD-10-CM | POA: Diagnosis not present

## 2023-06-11 DIAGNOSIS — M6281 Muscle weakness (generalized): Secondary | ICD-10-CM | POA: Diagnosis not present

## 2023-06-11 DIAGNOSIS — M62838 Other muscle spasm: Secondary | ICD-10-CM | POA: Insufficient documentation

## 2023-06-11 NOTE — Therapy (Signed)
OUTPATIENT PHYSICAL THERAPY SHOULDER TREATMENT  Patient Name: Cindy Blake MRN: [id number] DOB:1956/03/13, 67 y.o., female Today's Date: 06/11/2023  END OF SESSION:  PT End of Session - 06/11/23 1523     Visit Number 5    Number of Visits 12    Date for PT Re-Evaluation 06/24/23    Authorization Type FOTO AT LEAST EVERY 5TH VISIT.  PROGRESS NOTE AT 10TH VISIT.  KX MODIFIER AFTER 15 VISITS.    PT Start Time 1515    PT Stop Time 1605    PT Time Calculation (min) 50 min             Past Medical History:  Diagnosis Date   Allergy    Cancer (HCC)    Right Breast   Complication of anesthesia    Family history of adverse reaction to anesthesia    nausea   History of colon polyps    Hypertension    IBS (irritable bowel syndrome)    Mitral valve disorder    MVP (mitral valve prolapse)    PONV (postoperative nausea and vomiting)    Past Surgical History:  Procedure Laterality Date   ABDOMINAL HYSTERECTOMY  age 66   APPENDECTOMY     ARTERY BIOPSY Right 09/21/2022   Procedure: Right BIOPSY TEMPORAL ARTERY;  Surgeon: Maeola Harman, MD;  Location: [facility] OR;  Service: Vascular;  Laterality: Right;   CHOLECYSTECTOMY     LASIK  2000   SHOULDER SURGERY Right    bone spur   Patient Active Problem List   Diagnosis Date Noted   Osteoporosis 12/08/2019   Malignant neoplasm of left female breast (HCC) 05/01/2016   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  THERAPY DIAG:  Acute pain of right shoulder  Muscle weakness (generalized)  Other muscle spasm  Rationale for Evaluation and Treatment: Rehabilitation  ONSET DATE: ~2-3 months ago.  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  PAIN:  Are you having pain? Yes: NPRS scale: 2/10 Pain location: Right shoulder. Pain description: Ache and sore. Aggravating factors: Sleeping on right shoulder, carrying something  on her right shoulder. Relieving factors: Rest.  Recent injection.  PRECAUTIONS: None  WEIGHT BEARING RESTRICTIONS: No  FALLS:  Has patient fallen in last 6 months? No  LIVING ENVIRONMENT: Lives in: House/apartment Has following equipment at home: None   PLOF: Independent  PATIENT GOALS:Not have right shoulder pain.  NEXT MD VISIT:   OBJECTIVE:   PATIENT SURVEYS:  FOTO .  POSTURE: Forward head and rounded shoulders.  UPPER EXTREMITY ROM:  Full right shoulder AROM.  UPPER EXTREMITY MMT:  Right shoulder ER is 4/5.  SHOULDER SPECIAL TESTS: No significant pain reproduction with impingement testing of right shoulder.  Normal UE DTR's.  PALPATION:  Tender to palpation with increased tone over right posterior cuff musculature.   TODAY'S TREATMENT:                                                                                                                                         DATE: 06-11-23   UBE x 8 minutes   RW4 with yellow theraband x10-15 reps  Combo e'stim/US at 1.50 W/CM2 x 10 minutes   STW/M x 5 minutes utilizing ischemic release technique   HMP and IFC at 80-150 Hz on 40% scan x  minutes to patient's left posterior cuff musculature.    Normal modality response following removal of modality.  PATIENT EDUCATION: Education details: As below. Person educated: Patient Education method: Explanation, Demonstration, and Handouts Education comprehension: verbalized understanding  HOME  EXERCISE PROGRAM: HOME EXERCISE PROGRAM Created by [place] Applegate Jun 17th, 2024 View at [email or web address] using code: [email or web address]  Page 1 of 1 1 Exercise external Rotation Sidelying - Lie on uninvolved side with the involved shoulder lying on folded towel at side - Keep the elbow bent and arm against towel throughout this exercise. - Lift your wrist and forearm away from your abdomen. Slowly lower - Perform this motion only to a point that is pain-free. Repeat 15 Times Hold 2 Seconds Complete 2 Sets Perform 1 Time a Day  ASSESSMENT:  CLINICAL IMPRESSION: Patient arrived today doing very well with decreased RT shldr pain. She was able to continue with light strengthening exs f/b [name] combo and STW. IFC and HMP end of session. No increased pain with exs today. Pt did great.   making excellent progress and did very well with theraband resisted RW4 exercise.  She remembered these from last year when she came to PT for her left shoulder.  She still has the handout of the exercises and yellow and red theraband at home.  OBJECTIVE IMPAIRMENTS: decreased activity tolerance, decreased strength, increased muscle spasms, and pain.   ACTIVITY LIMITATIONS: carrying and reach over head  PARTICIPATION LIMITATIONS: laundry  PERSONAL FACTORS: 1 comorbidity: prior right shoulder surgery  are also affecting patient's functional outcome.   REHAB POTENTIAL: Excellent  CLINICAL DECISION MAKING: Stable/uncomplicated  EVALUATION COMPLEXITY: Low   GOALS:  SHORT TERM GOALS: Target date:  06/24/23  Ind with a HEP. Goal status: INITIAL  2.  Perform ADL's with right shoulder pain not > 2/10.  Goal status: INITIAL  3.  Patient able to sleep on right shoulder.  Goal status: INITIAL  4.  Carry pocketbook over right shoulder.  Goal status: INITIAL  PLAN:  PT FREQUENCY:  2-3 times a week  PT DURATION: 4 weeks  PLANNED INTERVENTIONS: Therapeutic exercises, Therapeutic activity, Gait  training, Patient/Family education, Self Care, Dry Needling, Electrical stimulation, Cryotherapy, Moist heat, Vasopneumatic device, Ultrasound, and Manual therapy  PLAN FOR NEXT SESSION: Review HEP, RW4, Combo e'stim/US, STW/M.   Cylis Ayars,CHRIS, PTA 06/11/2023, 5:00 PM

## 2023-06-18 ENCOUNTER — Ambulatory Visit: Payer: BC Managed Care – PPO | Admitting: Physical Therapy

## 2023-06-18 DIAGNOSIS — M62838 Other muscle spasm: Secondary | ICD-10-CM | POA: Diagnosis not present

## 2023-06-18 DIAGNOSIS — M25511 Pain in right shoulder: Secondary | ICD-10-CM

## 2023-06-18 DIAGNOSIS — M6281 Muscle weakness (generalized): Secondary | ICD-10-CM | POA: Diagnosis not present

## 2023-06-18 NOTE — Therapy (Signed)
OUTPATIENT PHYSICAL THERAPY SHOULDER TREATMENT  Patient Name: Cindy Blake MRN: [id number] DOB:[birth year], 67 y.o., female Today's Date: 06/18/2023  END OF SESSION:  PT End of Session - 06/18/23 1656     Visit Number 6    Number of Visits 12    Date for PT Re-Evaluation 06/24/23    Authorization Type FOTO AT LEAST EVERY 5TH VISIT.  PROGRESS NOTE AT 10TH VISIT.  KX MODIFIER AFTER 15 VISITS.    PT Start Time 0445    PT Stop Time 0541    PT Time Calculation (min) 56 min    Activity Tolerance Patient tolerated treatment well    Behavior During Therapy WFL for tasks assessed/performed             Past Medical History:  Diagnosis Date   Allergy    Cancer (HCC)    Right Breast   Complication of anesthesia    Family history of adverse reaction to anesthesia    nausea   History of colon polyps    Hypertension    IBS (irritable bowel syndrome)    Mitral valve disorder    MVP (mitral valve prolapse)    PONV (postoperative nausea and vomiting)    Past Surgical History:  Procedure Laterality Date   ABDOMINAL HYSTERECTOMY  age 15   APPENDECTOMY     ARTERY BIOPSY Right 09/21/2022   Procedure: Right BIOPSY TEMPORAL ARTERY;  Surgeon: Maeola Harman, MD;  Location: [facility] OR;  Service: Vascular;  Laterality: Right;   CHOLECYSTECTOMY     LASIK  2000   SHOULDER SURGERY Right    bone spur   Patient Active Problem List   Diagnosis Date Noted   Osteoporosis 12/08/2019   Malignant neoplasm of left female breast (HCC) 05/01/2016   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  THERAPY DIAG:  Acute pain of right shoulder  Muscle weakness (generalized)  Other muscle spasm  Rationale for Evaluation and  Treatment: Rehabilitation  ONSET DATE: ~2-3 months ago.  SUBJECTIVE:                                                                                                                                                                                      SUBJECTIVE STATEMENT: Low pain.  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  PAIN:  Are you having pain? Yes: NPRS scale: Low/10 Pain  location: Right shoulder. Pain description: Ache and sore. Aggravating factors: Sleeping on right shoulder, carrying something on her right shoulder. Relieving factors: Rest.  Recent injection.  PRECAUTIONS: None  WEIGHT BEARING RESTRICTIONS: No  FALLS:  Has patient fallen in last 6 months? No  LIVING ENVIRONMENT: Lives in: House/apartment Has following equipment at home: None   PLOF: Independent  PATIENT GOALS:Not have right shoulder pain.  NEXT MD VISIT:   OBJECTIVE:   PATIENT SURVEYS:  FOTO .  POSTURE: Forward head and rounded shoulders.  UPPER EXTREMITY ROM:  Full right shoulder AROM.  UPPER EXTREMITY MMT:  Right shoulder ER is 4/5.  SHOULDER SPECIAL TESTS: No significant pain reproduction with impingement testing of right shoulder.  Normal UE DTR's.  PALPATION:  Tender to palpation with increased tone over right posterior cuff musculature.   TODAY'S TREATMENT:                                                                                                                                         DATE: 06-18-23   UBE x 8 minutes f/b pulleys x 3 minutes f/b   RW4 with red theraband one minute each direction f/b   STW/M x 9 minutes utilizing ischemic release technique   HMP and IFC at 80-150 Hz on 40% scan x  minutes to patient's left posterior cuff musculature x 20 minutes.    Normal modality response following removal of modality.  PATIENT EDUCATION: Education details: As below. Person educated: Patient Education method:  Explanation, Demonstration, and Handouts Education comprehension: verbalized understanding  HOME EXERCISE PROGRAM: HOME EXERCISE PROGRAM Created by [place] Davarious Tumbleson Jun 17th, 2024 View at [email or web address] using code: [email or web address]  Page 1 of 1 1 Exercise external Rotation Sidelying - Lie on uninvolved side with the involved shoulder lying on folded towel at side - Keep the elbow bent and arm against towel throughout this exercise. - Lift your wrist and forearm away from your abdomen. Slowly lower - Perform this motion only to a point that is pain-free. Repeat 15 Times Hold 2 Seconds Complete 2 Sets Perform 1 Time a Day  ASSESSMENT:  CLINICAL IMPRESSION: Patient arrived today doing very well with decreased RT shldr pain.  Advanced to red theraband resisted RW4 exercise.    OBJECTIVE IMPAIRMENTS: decreased activity tolerance, decreased strength, increased muscle spasms, and pain.   ACTIVITY LIMITATIONS: carrying and reach over head  PARTICIPATION LIMITATIONS: laundry  PERSONAL FACTORS: 1 comorbidity: prior right shoulder surgery  are also affecting patient's functional outcome.   REHAB POTENTIAL: Excellent  CLINICAL DECISION MAKING: Stable/uncomplicated  EVALUATION COMPLEXITY: Low   GOALS:  SHORT TERM GOALS: Target date: 06/24/23  Ind with a HEP. Goal status: INITIAL  2.  Perform ADL's with right shoulder pain not > 2/10.  Goal status: INITIAL  3.  Patient able to sleep on right shoulder.  Goal status: INITIAL  4.  Carry pocketbook over right  shoulder.  Goal status: INITIAL  PLAN:  PT FREQUENCY:  2-3 times a week  PT DURATION: 4 weeks  PLANNED INTERVENTIONS: Therapeutic exercises, Therapeutic activity, Gait training, Patient/Family education, Self Care, Dry Needling, Electrical stimulation, Cryotherapy, Moist heat, Vasopneumatic device, Ultrasound, and Manual therapy  PLAN FOR NEXT SESSION: Review HEP, RW4, Combo e'stim/US, STW/M.   Saphronia Ozdemir, [place],  PT 06/18/2023, 5:48 PM

## 2023-06-20 ENCOUNTER — Encounter: Payer: Self-pay | Admitting: *Deleted

## 2023-06-20 ENCOUNTER — Ambulatory Visit: Payer: BC Managed Care – PPO | Attending: Rheumatology | Admitting: Rheumatology

## 2023-06-20 ENCOUNTER — Encounter: Payer: Self-pay | Admitting: Rheumatology

## 2023-06-20 ENCOUNTER — Ambulatory Visit: Payer: BC Managed Care – PPO | Admitting: *Deleted

## 2023-06-20 VITALS — BP 147/69 | HR 56 | Resp 15 | Ht 63.0 in | Wt 158.0 lb

## 2023-06-20 DIAGNOSIS — Z170 Estrogen receptor positive status [ER+]: Secondary | ICD-10-CM

## 2023-06-20 DIAGNOSIS — E7800 Pure hypercholesterolemia, unspecified: Secondary | ICD-10-CM

## 2023-06-20 DIAGNOSIS — G453 Amaurosis fugax: Secondary | ICD-10-CM | POA: Diagnosis not present

## 2023-06-20 DIAGNOSIS — C50212 Malignant neoplasm of upper-inner quadrant of left female breast: Secondary | ICD-10-CM

## 2023-06-20 DIAGNOSIS — M316 Other giant cell arteritis: Secondary | ICD-10-CM

## 2023-06-20 DIAGNOSIS — M62838 Other muscle spasm: Secondary | ICD-10-CM

## 2023-06-20 DIAGNOSIS — F411 Generalized anxiety disorder: Secondary | ICD-10-CM

## 2023-06-20 DIAGNOSIS — M6281 Muscle weakness (generalized): Secondary | ICD-10-CM | POA: Diagnosis not present

## 2023-06-20 DIAGNOSIS — M25511 Pain in right shoulder: Secondary | ICD-10-CM | POA: Diagnosis not present

## 2023-06-20 DIAGNOSIS — E559 Vitamin D deficiency, unspecified: Secondary | ICD-10-CM

## 2023-06-20 DIAGNOSIS — Z79899 Other long term (current) drug therapy: Secondary | ICD-10-CM

## 2023-06-20 DIAGNOSIS — M810 Age-related osteoporosis without current pathological fracture: Secondary | ICD-10-CM

## 2023-06-20 DIAGNOSIS — Z7952 Long term (current) use of systemic steroids: Secondary | ICD-10-CM

## 2023-06-20 DIAGNOSIS — I10 Essential (primary) hypertension: Secondary | ICD-10-CM

## 2023-06-20 NOTE — Therapy (Signed)
OUTPATIENT PHYSICAL THERAPY SHOULDER TREATMENT  Patient Name: Cindy Blake MRN: [id number] DOB:28-Jan-1956, 67 y.o., female Today's Date: 06/20/2023  END OF SESSION:  PT End of Session - 06/20/23 1308     Visit Number 7    Number of Visits 12    Date for PT Re-Evaluation 06/24/23    Authorization Type FOTO AT LEAST EVERY 5TH VISIT.  PROGRESS NOTE AT 10TH VISIT.  KX MODIFIER AFTER 15 VISITS.    PT Start Time 1300    PT Stop Time 1349    PT Time Calculation (min) 49 min             Past Medical History:  Diagnosis Date   Allergy    Cancer (HCC)    Right Breast   Complication of anesthesia    Family history of adverse reaction to anesthesia    nausea   History of colon polyps    Hypertension    IBS (irritable bowel syndrome)    Mitral valve disorder    MVP (mitral valve prolapse)    PONV (postoperative nausea and vomiting)    Past Surgical History:  Procedure Laterality Date   ABDOMINAL HYSTERECTOMY  age 51   APPENDECTOMY     ARTERY BIOPSY Right 09/21/2022   Procedure: Right BIOPSY TEMPORAL ARTERY;  Surgeon: Maeola Harman, MD;  Location: [facility] OR;  Service: Vascular;  Laterality: Right;   CHOLECYSTECTOMY     LASIK  2000   SHOULDER SURGERY Right    bone spur   Patient Active Problem List   Diagnosis Date Noted   Osteoporosis 12/08/2019   Malignant neoplasm of left female breast (HCC) 05/01/2016   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  THERAPY DIAG:  Acute pain of right shoulder  Muscle weakness (generalized)  Other muscle spasm  Rationale for Evaluation and Treatment: Rehabilitation  ONSET DATE: ~2-3 months ago.  SUBJECTIVE:                                                                                                                                                                                       SUBJECTIVE STATEMENT: RT shldr no pain today, some soreness  PERTINENT HISTORY: Right shoulder ATS (2000), blind in right eye ( giant cell arteritis).  PAIN:  Are you having pain? Yes: NPRS scale: Low/10 Pain location: Right shoulder. Pain description: Ache and sore. Aggravating factors: Sleeping on right shoulder,  carrying something on her right shoulder. Relieving factors: Rest.  Recent injection.  PRECAUTIONS: None  WEIGHT BEARING RESTRICTIONS: No  FALLS:  Has patient fallen in last 6 months? No  LIVING ENVIRONMENT: Lives in: House/apartment Has following equipment at home: None   PLOF: Independent  PATIENT GOALS:Not have right shoulder pain.  NEXT MD VISIT:   OBJECTIVE:   PATIENT SURVEYS:  FOTO .  POSTURE: Forward head and rounded shoulders.  UPPER EXTREMITY ROM:  Full right shoulder AROM.  UPPER EXTREMITY MMT:  Right shoulder ER is 4/5.  SHOULDER SPECIAL TESTS: No significant pain reproduction with impingement testing of right shoulder.  Normal UE DTR's.  PALPATION:  Tender to palpation with increased tone over right posterior cuff musculature.   TODAY'S TREATMENT:                                                                                                                                         DATE: 06-20-23   UBE x 8 minutes  pulleys x 3 minutes   RW4 with red theraband one minute each direction    STW/M x 9 minutes utilizing ischemic release technique   HMP and IFC at 80-150 Hz on 40% scan x  minutes to patient's left posterior cuff musculature x 16 minutes.    Normal modality response following removal of modality.  PATIENT EDUCATION: Education details: As below. Person educated: Patient Education method: Explanation, Demonstration, and Handouts Education comprehension: verbalized understanding  HOME  EXERCISE PROGRAM: HOME EXERCISE PROGRAM Created by [place] Applegate Jun 17th, 2024 View at [email or web address] using code: [email or web address]  Page 1 of 1 1 Exercise external Rotation Sidelying - Lie on uninvolved side with the involved shoulder lying on folded towel at side - Keep the elbow bent and arm against towel throughout this exercise. - Lift your wrist and forearm away from your abdomen. Slowly lower - Perform this motion only to a point that is pain-free. Repeat 15 Times Hold 2 Seconds Complete 2 Sets Perform 1 Time a Day  ASSESSMENT:  CLINICAL IMPRESSION: Patient arrived today still doing well with RT shldr. She was able to continue with ex progression and did great. Mainly fatigue end of session with minimal soreness now. IFC and HMP end of session. Notable trigger point posterior cuff  OBJECTIVE IMPAIRMENTS: decreased activity tolerance, decreased strength, increased muscle spasms, and pain.   ACTIVITY LIMITATIONS: carrying and reach over head  PARTICIPATION LIMITATIONS: laundry  PERSONAL FACTORS: 1 comorbidity: prior right shoulder surgery  are also affecting patient's functional outcome.   REHAB POTENTIAL: Excellent  CLINICAL DECISION MAKING: Stable/uncomplicated  EVALUATION COMPLEXITY: Low   GOALS:  SHORT TERM GOALS: Target date: 06/24/23  Ind with a HEP. Goal status: MET  2.  Perform ADL's with right shoulder pain not > 2/10.  Goal status: MET  3.  Patient able to sleep on right shoulder.  Goal status: On going  4.  Carry pocketbook over right shoulder.  Goal status: On going  PLAN:  PT FREQUENCY:  2-3 times a week  PT DURATION: 4 weeks  PLANNED INTERVENTIONS: Therapeutic exercises, Therapeutic activity, Gait training, Patient/Family education, Self Care, Dry Needling, Electrical stimulation, Cryotherapy, Moist heat, Vasopneumatic device, Ultrasound, and Manual therapy  PLAN FOR NEXT SESSION: Review HEP, RW4, Combo e'stim/US,  STW/M.   Kamari Bilek,CHRIS, PTA 06/20/2023, 5:22 PM11

## 2023-06-20 NOTE — Patient Instructions (Signed)
Standing Labs We placed an order today for your standing lab work.   Please have your standing labs drawn in August and every 3 months Get TB Gold with your November labs  Please have your labs drawn 2 weeks prior to your appointment so that the provider can discuss your lab results at your appointment, if possible.  Please note that you may see your imaging and lab results in MyChart before we have reviewed them. We will contact you once all results are reviewed. Please allow our office up to 72 hours to thoroughly review all of the results before contacting the office for clarification of your results.  WALK-IN LAB HOURS  Monday through Thursday from 8:00 am -12:30 pm and 1:00 pm-5:00 pm and Friday from 8:00 am-12:00 pm.  Patients with office visits requiring labs will be seen before walk-in labs.  You may encounter longer than normal wait times. Please allow additional time. Wait times may be shorter on  Monday and Thursday afternoons.  We do not book appointments for walk-in labs. We appreciate your patience and understanding with our staff.   Labs are drawn by [facility]. Please bring your co-pay at the time of your lab draw.  You may receive a bill from [facility] for your lab work.  Please note if you are on Hydroxychloroquine and and an order has been placed for a Hydroxychloroquine level,  you will need to have it drawn 4 hours or more after your last dose.  If you wish to have your labs drawn at another location, please call the office 24 hours in advance so we can fax the orders.  The office is located at [address], [place], [place] [address]   If you have any questions regarding directions or hours of operation,  please call [phone].   As a reminder, please drink plenty of water prior to coming for your lab work. Thanks!   Vaccines You are taking a medication(s) that can suppress your immune system.  The following immunizations are recommended: Flu  annually Covid-19  RSV Td/Tdap (tetanus, diphtheria, pertussis) every 10 years Pneumonia (Prevnar 15 then Pneumovax 23 at least 1 year apart.  Alternatively, can take Prevnar 20 without needing additional dose) Shingrix: 2 doses from 4 weeks to 6 months apart  Please check with your PCP to make sure you are up to date.   If you have signs or symptoms of an infection or start antibiotics: First, call your PCP for workup of your infection. Hold your medication through the infection, until you complete your antibiotics, and until symptoms resolve if you take the following: Injectable medication (Actemra, Benlysta, Cimzia, Cosentyx, Enbrel, Humira, Kevzara, Orencia, Remicade, Simponi, Stelara, Taltz, Tremfya) Methotrexate Leflunomide (Arava) Mycophenolate (Cellcept) Harriette Ohara, Olumiant, or Rinvoq

## 2023-07-02 ENCOUNTER — Ambulatory Visit: Payer: BC Managed Care – PPO | Admitting: *Deleted

## 2023-07-02 DIAGNOSIS — M62838 Other muscle spasm: Secondary | ICD-10-CM

## 2023-07-02 DIAGNOSIS — M6281 Muscle weakness (generalized): Secondary | ICD-10-CM | POA: Diagnosis not present

## 2023-07-02 DIAGNOSIS — M25511 Pain in right shoulder: Secondary | ICD-10-CM

## 2023-07-02 NOTE — Therapy (Signed)
OUTPATIENT PHYSICAL THERAPY SHOULDER TREATMENT  Patient Name: Cindy Blake MRN: [id number] DOB:1956-02-13, 67 y.o., female Today's Date: 07/02/2023  END OF SESSION:  PT End of Session - 07/02/23 1611     Visit Number 8    Number of Visits 12    Date for PT Re-Evaluation 06/24/23    Authorization Type FOTO AT LEAST EVERY 5TH VISIT.  PROGRESS NOTE AT 10TH VISIT.  KX MODIFIER AFTER 15 VISITS.    PT Start Time 1600    PT Stop Time 1650    PT Time Calculation (min) 50 min             Past Medical History:  Diagnosis Date   Allergy    Cancer (HCC)    Right Breast   Complication of anesthesia    Family history of adverse reaction to anesthesia    nausea   History of colon polyps    Hypertension    IBS (irritable bowel syndrome)    Mitral valve disorder    MVP (mitral valve prolapse)    PONV (postoperative nausea and vomiting)    Past Surgical History:  Procedure Laterality Date   ABDOMINAL HYSTERECTOMY  age 54   APPENDECTOMY     ARTERY BIOPSY Right 09/21/2022   Procedure: Right BIOPSY TEMPORAL ARTERY;  Surgeon: Maeola Harman, MD;  Location: [facility] OR;  Service: Vascular;  Laterality: Right;   CHOLECYSTECTOMY     LASIK  2000   SHOULDER SURGERY Right    bone spur   Patient Active Problem List   Diagnosis Date Noted   Osteoporosis 12/08/2019   Malignant neoplasm of left female breast (HCC) 05/01/2016   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  THERAPY DIAG:  Acute pain of right shoulder  Muscle weakness (generalized)  Other muscle spasm  Rationale for Evaluation and Treatment: Rehabilitation  ONSET DATE: ~2-3 months ago.  SUBJECTIVE:                                                                                                                                                                                       SUBJECTIVE STATEMENT: RT shldr no pain today, just soreness. 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  PAIN:  Are you having pain? Yes: NPRS scale: Low/10 Pain location: Right shoulder. Pain description: Ache and sore. Aggravating factors: Sleeping  on right shoulder, carrying something on her right shoulder. Relieving factors: Rest.  Recent injection.  PRECAUTIONS: None  WEIGHT BEARING RESTRICTIONS: No  FALLS:  Has patient fallen in last 6 months? No  LIVING ENVIRONMENT: Lives in: House/apartment Has following equipment at home: None   PLOF: Independent  PATIENT GOALS:Not have right shoulder pain.  NEXT MD VISIT:   OBJECTIVE:   PATIENT SURVEYS:  FOTO .  POSTURE: Forward head and rounded shoulders.  UPPER EXTREMITY ROM:  Full right shoulder AROM.  UPPER EXTREMITY MMT:  Right shoulder ER is 4/5.  SHOULDER SPECIAL TESTS: No significant pain reproduction with impingement testing of right shoulder.  Normal UE DTR's.  PALPATION:  Tender to palpation with increased tone over right posterior cuff musculature.   TODAY'S TREATMENT:                                                                                                                                         DATE: 07-02-23                     LT shldr   UBE x 8 minutes  pulleys x 3 minutes   RW4 with red theraband one minute each direction    STW/M x 9 minutes utilizing ischemic release technique   HMP and IFC at 80-150 Hz on 40% scan x  minutes to patient's left posterior cuff musculature x 15 minutes.    Normal modality response following removal of modality.  PATIENT EDUCATION: Education details: As below. Person educated: Patient Education method: Explanation, Demonstration, and Handouts Education  comprehension: verbalized understanding  HOME EXERCISE PROGRAM: HOME EXERCISE PROGRAM Created by [place] Applegate Jun 17th, 2024 View at [email or web address] using code: [email or web address]  Page 1 of 1 1 Exercise external Rotation Sidelying - Lie on uninvolved side with the involved shoulder lying on folded towel at side - Keep the elbow bent and arm against towel throughout this exercise. - Lift your wrist and forearm away from your abdomen. Slowly lower - Perform this motion only to a point that is pain-free. Repeat 15 Times Hold 2 Seconds Complete 2 Sets Perform 1 Time a Day  ASSESSMENT:  CLINICAL IMPRESSION: Patient arrived today still doing well with RT shldr and was able to continue with AROM  and light strengthening exs. Mainly fatigue end of session with minimal soreness again. IFC and HMP end of session. Notable trigger point posterior cuff, but less now.Pt able to sleep on RT shldr now.  OBJECTIVE IMPAIRMENTS: decreased activity tolerance, decreased strength, increased muscle spasms, and pain.   ACTIVITY LIMITATIONS: carrying and reach over head  PARTICIPATION LIMITATIONS: laundry  PERSONAL FACTORS: 1 comorbidity: prior right shoulder surgery  are also affecting patient's functional outcome.   REHAB POTENTIAL: Excellent  CLINICAL DECISION MAKING: Stable/uncomplicated  EVALUATION COMPLEXITY: Low   GOALS:  SHORT TERM GOALS: Target date: 06/24/23  Ind with a HEP.  Goal status: MET  2.  Perform ADL's with right shoulder pain not > 2/10.  Goal status: MET  3.  Patient able to sleep on right shoulder.  Goal status: MET         4.  Carry pocketbook over right shoulder.  Goal status: On going  PLAN:  PT FREQUENCY:  2-3 times a week  PT DURATION: 4 weeks  PLANNED INTERVENTIONS: Therapeutic exercises, Therapeutic activity, Gait training, Patient/Family education, Self Care, Dry Needling, Electrical stimulation, Cryotherapy, Moist heat, Vasopneumatic device,  Ultrasound, and Manual therapy  PLAN FOR NEXT SESSION: Review HEP, RW4, Combo e'stim/US, STW/M.   Bernardo Brayman,CHRIS, PTA 07/02/2023, 5:06 PM11

## 2023-07-04 ENCOUNTER — Ambulatory Visit: Payer: BC Managed Care – PPO | Admitting: *Deleted

## 2023-07-04 DIAGNOSIS — M6281 Muscle weakness (generalized): Secondary | ICD-10-CM | POA: Diagnosis not present

## 2023-07-04 DIAGNOSIS — M25511 Pain in right shoulder: Secondary | ICD-10-CM | POA: Diagnosis not present

## 2023-07-04 DIAGNOSIS — M62838 Other muscle spasm: Secondary | ICD-10-CM | POA: Diagnosis not present

## 2023-07-04 NOTE — Therapy (Signed)
OUTPATIENT PHYSICAL THERAPY SHOULDER TREATMENT  Patient Name: Cindy Blake MRN: [id number] DOB:1956/03/08, 67 y.o., female Today's Date: 07/04/2023  END OF SESSION:  PT End of Session - 07/04/23 1600     Visit Number 9    Number of Visits 12    Date for PT Re-Evaluation 06/24/23    Authorization Type FOTO AT LEAST EVERY 5TH VISIT.  PROGRESS NOTE AT 10TH VISIT.  KX MODIFIER AFTER 15 VISITS.    PT Start Time 1600    PT Stop Time 1651    PT Time Calculation (min) 51 min             Past Medical History:  Diagnosis Date   Allergy    Cancer (HCC)    Right Breast   Complication of anesthesia    Family history of adverse reaction to anesthesia    nausea   History of colon polyps    Hypertension    IBS (irritable bowel syndrome)    Mitral valve disorder    MVP (mitral valve prolapse)    PONV (postoperative nausea and vomiting)    Past Surgical History:  Procedure Laterality Date   ABDOMINAL HYSTERECTOMY  age 38   APPENDECTOMY     ARTERY BIOPSY Right 09/21/2022   Procedure: Right BIOPSY TEMPORAL ARTERY;  Surgeon: Maeola Harman, MD;  Location: [facility] OR;  Service: Vascular;  Laterality: Right;   CHOLECYSTECTOMY     LASIK  2000   SHOULDER SURGERY Right    bone spur   Patient Active Problem List   Diagnosis Date Noted   Osteoporosis 12/08/2019   Malignant neoplasm of left female breast (HCC) 05/01/2016   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  THERAPY DIAG:  No diagnosis found.  Rationale for Evaluation and Treatment: Rehabilitation  ONSET DATE: ~2-3 months ago.  SUBJECTIVE:                                                                                                                                                                                       SUBJECTIVE STATEMENT: RT shldr no pain today, just soreness. 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  PAIN:  Are you having pain? Yes: NPRS scale: Low/10 Pain location: Right shoulder. Pain description: Ache and sore. Aggravating factors: Sleeping on right shoulder, carrying something on her right shoulder. Relieving  factors: Rest.  Recent injection.  PRECAUTIONS: None  WEIGHT BEARING RESTRICTIONS: No  FALLS:  Has patient fallen in last 6 months? No  LIVING ENVIRONMENT: Lives in: House/apartment Has following equipment at home: None   PLOF: Independent  PATIENT GOALS:Not have right shoulder pain.  NEXT MD VISIT:   OBJECTIVE:   PATIENT SURVEYS:  FOTO .  POSTURE: Forward head and rounded shoulders.  UPPER EXTREMITY ROM:  Full right shoulder AROM.  UPPER EXTREMITY MMT:  Right shoulder ER is 4/5.  SHOULDER SPECIAL TESTS: No significant pain reproduction with impingement testing of right shoulder.  Normal UE DTR's.  PALPATION:  Tender to palpation with increased tone over right posterior cuff musculature.   TODAY'S TREATMENT:                                                                                                                                         DATE: 07-02-23                     LT shldr   UBE x 8 minutes  pulleys x 4 minutes   RW4  with red theraband one minute each direction    STW/M x 9 minutes utilizing ischemic release technique   HMP and IFC at 80-150 Hz on 40% scan x  minutes to patient's left posterior cuff musculature x 15 minutes.    Normal modality response following removal of modality.  PATIENT EDUCATION: Education details: As below. Person educated: Patient Education method: Explanation, Demonstration, and Handouts Education comprehension: verbalized understanding  HOME EXERCISE  PROGRAM: HOME EXERCISE PROGRAM Created by [place] Applegate Jun 17th, 2024 View at [email or web address] using code: [email or web address]  Page 1 of 1 1 Exercise external Rotation Sidelying - Lie on uninvolved side with the involved shoulder lying on folded towel at side - Keep the elbow bent and arm against towel throughout this exercise. - Lift your wrist and forearm away from your abdomen. Slowly lower - Perform this motion only to a point that is pain-free. Repeat 15 Times Hold 2 Seconds Complete 2 Sets Perform 1 Time a Day  ASSESSMENT:  CLINICAL IMPRESSION: Patient still doing well with RT shldr and was able to continue with AROM  and light strengthening exs. Mainly fatigue end of session again and  with minimal soreness. IFC and HMP end of session to RT shldr Notable trigger point posterior cuff, but less now.Pt able to sleep on RT shldr now.   OBJECTIVE IMPAIRMENTS: decreased activity tolerance, decreased strength, increased muscle spasms, and pain.   ACTIVITY LIMITATIONS: carrying and reach over head  PARTICIPATION LIMITATIONS: laundry  PERSONAL FACTORS: 1 comorbidity: prior right shoulder surgery  are also affecting patient's functional outcome.   REHAB POTENTIAL: Excellent  CLINICAL DECISION MAKING: Stable/uncomplicated  EVALUATION COMPLEXITY: Low   GOALS:  SHORT TERM GOALS: Target date: 06/24/23  Ind with a HEP. Goal status: MET  2.  Perform ADL's with right shoulder pain not > 2/10.  Goal status: MET  3.  Patient able to sleep on right shoulder.  Goal status: MET         4.  Carry pocketbook over right shoulder.  Goal status: On going  PLAN:  PT FREQUENCY:  2-3 times a week  PT DURATION: 4 weeks  PLANNED INTERVENTIONS: Therapeutic exercises, Therapeutic activity, Gait training, Patient/Family education, Self Care, Dry Needling, Electrical stimulation, Cryotherapy, Moist heat, Vasopneumatic device, Ultrasound, and Manual therapy  PLAN FOR NEXT SESSION:  Review HEP, RW4, Combo e'stim/US, STW/M.   Lorena Clearman,CHRIS, PTA 07/04/2023, 5:49 PM11

## 2023-07-07 ENCOUNTER — Other Ambulatory Visit: Payer: Self-pay | Admitting: Physician Assistant

## 2023-07-08 NOTE — Telephone Encounter (Signed)
Last Fill: 06/10/2023  Next Visit: 09/26/2023  Last Visit: 06/20/2023  Dx: Temporal arteritis   Current Dose per office note on 06/20/2023: prednisone 1 mg p.o. daily   Okay to refill Prednisone?

## 2023-07-09 ENCOUNTER — Ambulatory Visit: Payer: BC Managed Care – PPO | Admitting: Physical Therapy

## 2023-07-09 DIAGNOSIS — M6281 Muscle weakness (generalized): Secondary | ICD-10-CM | POA: Diagnosis not present

## 2023-07-09 DIAGNOSIS — M62838 Other muscle spasm: Secondary | ICD-10-CM

## 2023-07-09 DIAGNOSIS — M25511 Pain in right shoulder: Secondary | ICD-10-CM

## 2023-07-09 NOTE — Therapy (Signed)
OUTPATIENT PHYSICAL THERAPY SHOULDER TREATMENT  Patient Name: Cindy Blake MRN: [id number] DOB:05-30-1956, 67 y.o., female Today's Date: 07/09/2023  END OF SESSION:  PT End of Session - 07/09/23 1634     Visit Number 10    Number of Visits 12    Date for PT Re-Evaluation 06/24/23    Authorization Type FOTO AT LEAST EVERY 5TH VISIT.  PROGRESS NOTE AT 10TH VISIT.  KX MODIFIER AFTER 15 VISITS.    PT Start Time 0400    PT Stop Time 0451    PT Time Calculation (min) 51 min    Activity Tolerance Patient tolerated treatment well    Behavior During Therapy WFL for tasks assessed/performed             Past Medical History:  Diagnosis Date   Allergy    Cancer (HCC)    Right Breast   Complication of anesthesia    Family history of adverse reaction to anesthesia    nausea   History of colon polyps    Hypertension    IBS (irritable bowel syndrome)    Mitral valve disorder    MVP (mitral valve prolapse)    PONV (postoperative nausea and vomiting)    Past Surgical History:  Procedure Laterality Date   ABDOMINAL HYSTERECTOMY  age 84   APPENDECTOMY     ARTERY BIOPSY Right 09/21/2022   Procedure: Right BIOPSY TEMPORAL ARTERY;  Surgeon: Maeola Harman, MD;  Location: [facility] OR;  Service: Vascular;  Laterality: Right;   CHOLECYSTECTOMY     LASIK  2000   SHOULDER SURGERY Right    bone spur   Patient Active Problem List   Diagnosis Date Noted   Osteoporosis 12/08/2019   Malignant neoplasm of left female breast (HCC) 05/01/2016   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  THERAPY DIAG:  Acute pain of right shoulder  Muscle weakness (generalized)  Other muscle spasm  Rationale for Evaluation and  Treatment: Rehabilitation  ONSET DATE: ~2-3 months ago.  SUBJECTIVE:                                                                                                                                                                                      SUBJECTIVE STATEMENT: Just soreness. 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  PAIN:  Are you having pain? Yes: NPRS  scale: Low/10 Pain location: Right shoulder. Pain description: Ache and sore. Aggravating factors: Sleeping on right shoulder, carrying something on her right shoulder. Relieving factors: Rest.  Recent injection.  PRECAUTIONS: None  WEIGHT BEARING RESTRICTIONS: No  FALLS:  Has patient fallen in last 6 months? No  LIVING ENVIRONMENT: Lives in: House/apartment Has following equipment at home: None   PLOF: Independent  PATIENT GOALS:Not have right shoulder pain.  NEXT MD VISIT:   OBJECTIVE:   PATIENT SURVEYS:  FOTO .  POSTURE: Forward head and rounded shoulders.  UPPER EXTREMITY ROM:  Full right shoulder AROM.  UPPER EXTREMITY MMT:  Right shoulder ER is 4/5.  SHOULDER SPECIAL TESTS: No significant pain reproduction with impingement testing of right shoulder.  Normal UE DTR's.  PALPATION:  Tender to palpation with increased tone over right posterior cuff musculature.   TODAY'S TREATMENT:                                                                                                                                         DATE: 07-09-23                     LT shldr   UBE x 8 minutes  pulleys x 5 minutes f/b wall ladder x 5 minutes f/b UE Ranger on wall x 5 minutes f/b    HMP and IFC at 80-150 Hz on 40% scan x  minutes to patient's left posterior cuff musculature x 20 minutes.    Normal modality response following removal of modality.  PATIENT EDUCATION: Education details: As below. Person educated: Patient Education method:  Explanation, Demonstration, and Handouts Education comprehension: verbalized understanding  HOME EXERCISE PROGRAM: HOME EXERCISE PROGRAM Created by [place] Mashal Slavick Jun 17th, 2024 View at [email or web address] using code: [email or web address]  Page 1 of 1 1 Exercise external Rotation Sidelying - Lie on uninvolved side with the involved shoulder lying on folded towel at side - Keep the elbow bent and arm against towel throughout this exercise. - Lift your wrist and forearm away from your abdomen. Slowly lower - Perform this motion only to a point that is pain-free. Repeat 15 Times Hold 2 Seconds Complete 2 Sets Perform 1 Time a Day  ASSESSMENT:  CLINICAL IMPRESSION: Patient very pleased.  Current goals are met.  OBJECTIVE IMPAIRMENTS: decreased activity tolerance, decreased strength, increased muscle spasms, and pain.   ACTIVITY LIMITATIONS: carrying and reach over head  PARTICIPATION LIMITATIONS: laundry  PERSONAL FACTORS: 1 comorbidity: prior right shoulder surgery  are also affecting patient's functional outcome.   REHAB POTENTIAL: Excellent  CLINICAL DECISION MAKING: Stable/uncomplicated  EVALUATION COMPLEXITY: Low   GOALS:  SHORT TERM GOALS: Target date: 06/24/23  Ind with a HEP. Goal status: MET  2.  Perform ADL's with right shoulder pain not > 2/10.  Goal status: MET  3.  Patient able to sleep on right shoulder.  Goal status: MET  4.  Carry pocketbook over right shoulder.  Goal status: MET  PLAN:  PT FREQUENCY:  2-3 times a week  PT DURATION: 4 weeks  PLANNED INTERVENTIONS: Therapeutic exercises, Therapeutic activity, Gait training, Patient/Family education, Self Care, Dry Needling, Electrical stimulation, Cryotherapy, Moist heat, Vasopneumatic device, Ultrasound, and Manual therapy  PLAN FOR NEXT SESSION: Review HEP, RW4, Combo e'stim/US, STW/M.  Progress Note Reporting Period 05/27/23 to 07/09/23  See note below for Objective Data and Assessment of  Progress/Goals. Excellent progress.  Current goals are met.      Gage Treiber, [place], PT 07/09/2023, 5:36 PM11

## 2023-07-11 DIAGNOSIS — M25511 Pain in right shoulder: Secondary | ICD-10-CM | POA: Diagnosis not present

## 2023-07-12 DIAGNOSIS — Z170 Estrogen receptor positive status [ER+]: Secondary | ICD-10-CM | POA: Diagnosis not present

## 2023-07-12 DIAGNOSIS — C50212 Malignant neoplasm of upper-inner quadrant of left female breast: Secondary | ICD-10-CM | POA: Diagnosis not present

## 2023-07-12 DIAGNOSIS — Z7983 Long term (current) use of bisphosphonates: Secondary | ICD-10-CM | POA: Diagnosis not present

## 2023-07-12 DIAGNOSIS — Z79899 Other long term (current) drug therapy: Secondary | ICD-10-CM | POA: Diagnosis not present

## 2023-07-12 DIAGNOSIS — Z1502 Genetic susceptibility to malignant neoplasm of ovary: Secondary | ICD-10-CM | POA: Diagnosis not present

## 2023-07-12 DIAGNOSIS — Z1501 Genetic susceptibility to malignant neoplasm of breast: Secondary | ICD-10-CM | POA: Diagnosis not present

## 2023-07-12 DIAGNOSIS — Z7982 Long term (current) use of aspirin: Secondary | ICD-10-CM | POA: Diagnosis not present

## 2023-07-12 DIAGNOSIS — Z79810 Long term (current) use of selective estrogen receptor modulators (SERMs): Secondary | ICD-10-CM | POA: Diagnosis not present

## 2023-07-12 DIAGNOSIS — M810 Age-related osteoporosis without current pathological fracture: Secondary | ICD-10-CM | POA: Diagnosis not present

## 2023-07-12 DIAGNOSIS — Z1589 Genetic susceptibility to other disease: Secondary | ICD-10-CM | POA: Diagnosis not present

## 2023-07-12 DIAGNOSIS — Z1509 Genetic susceptibility to other malignant neoplasm: Secondary | ICD-10-CM | POA: Diagnosis not present

## 2023-07-12 DIAGNOSIS — I10 Essential (primary) hypertension: Secondary | ICD-10-CM | POA: Diagnosis not present

## 2023-08-05 ENCOUNTER — Other Ambulatory Visit: Payer: BC Managed Care – PPO

## 2023-08-05 DIAGNOSIS — Z79899 Other long term (current) drug therapy: Secondary | ICD-10-CM

## 2023-08-05 LAB — CMP14+EGFR
ALT: 20 IU/L (ref 0–32)
AST: 26 IU/L (ref 0–40)
Albumin: 4.4 g/dL (ref 3.9–4.9)
Alkaline Phosphatase: 49 IU/L (ref 44–121)
BUN/Creatinine Ratio: 18 (ref 12–28)
BUN: 15 mg/dL (ref 8–27)
Bilirubin Total: 0.5 mg/dL (ref 0.0–1.2)
CO2: 24 mmol/L (ref 20–29)
Calcium: 8.8 mg/dL (ref 8.7–10.3)
Chloride: 104 mmol/L (ref 96–106)
Creatinine, Ser: 0.84 mg/dL (ref 0.57–1.00)
Globulin, Total: 1.5 g/dL (ref 1.5–4.5)
Glucose: 80 mg/dL (ref 70–99)
Potassium: 3.5 mmol/L (ref 3.5–5.2)
Sodium: 144 mmol/L (ref 134–144)
Total Protein: 5.9 g/dL — ABNORMAL LOW (ref 6.0–8.5)
eGFR: 76 mL/min/{1.73_m2} (ref >59)

## 2023-08-05 LAB — CBC WITH DIFFERENTIAL/PLATELET
Basophils Absolute: 0 10*3/uL (ref 0.0–0.2)
Basos: 1 %
EOS (ABSOLUTE): 0.1 10*3/uL (ref 0.0–0.4)
Eos: 3 %
Hematocrit: 39.9 % (ref 34.0–46.6)
Hemoglobin: 12.9 g/dL (ref 11.1–15.9)
Immature Grans (Abs): 0 10*3/uL (ref 0.0–0.1)
Immature Granulocytes: 0 %
Lymphocytes Absolute: 1.3 10*3/uL (ref 0.7–3.1)
Lymphs: 38 %
MCH: 29.7 pg (ref 26.6–33.0)
MCHC: 32.3 g/dL (ref 31.5–35.7)
MCV: 92 fL (ref 79–97)
Monocytes Absolute: 0.3 10*3/uL (ref 0.1–0.9)
Monocytes: 10 %
Neutrophils Absolute: 1.7 10*3/uL (ref 1.4–7.0)
Neutrophils: 48 %
Platelets: 156 10*3/uL (ref 150–450)
RBC: 4.34 x10E6/uL (ref 3.77–5.28)
RDW: 12.3 % (ref 11.7–15.4)
WBC: 3.5 10*3/uL (ref 3.4–10.8)

## 2023-08-06 NOTE — Progress Notes (Signed)
CBC and CMP are stable.

## 2023-08-17 ENCOUNTER — Other Ambulatory Visit: Payer: Self-pay | Admitting: Family Medicine

## 2023-08-17 DIAGNOSIS — E876 Hypokalemia: Secondary | ICD-10-CM

## 2023-08-19 ENCOUNTER — Other Ambulatory Visit: Payer: Self-pay | Admitting: Family Medicine

## 2023-08-19 ENCOUNTER — Other Ambulatory Visit: Payer: Self-pay | Admitting: Physician Assistant

## 2023-08-19 DIAGNOSIS — E876 Hypokalemia: Secondary | ICD-10-CM

## 2023-08-19 NOTE — Telephone Encounter (Signed)
Last Fill: 05/27/2023  Labs: 08/05/2023 CBC and CMP are stable   TB Gold: 09/26/2022 Neg    Next Visit: 09/25/2022  Last Visit: 06/20/2023  DX: Temporal arteritis   Current Dose per office note 06/20/2023: Actemra 162 mg subcu every 7 days   Okay to refill Actemra?

## 2023-09-05 ENCOUNTER — Telehealth: Payer: Self-pay | Admitting: Pharmacist

## 2023-09-05 NOTE — Telephone Encounter (Signed)
Submitted a Prior Authorization request to [facility] for ACTEMRA SQ via fax. Will update once we receive a response.  Case # [id number] Phone: [phone] Fax: [phone]  Chesley Mires, PharmD, MPH, BCPS, CPP Clinical Pharmacist (Rheumatology and Pulmonology)

## 2023-09-10 ENCOUNTER — Encounter: Payer: Self-pay | Admitting: Family Medicine

## 2023-09-10 ENCOUNTER — Ambulatory Visit: Payer: BC Managed Care – PPO | Admitting: Family Medicine

## 2023-09-10 VITALS — BP 121/61 | HR 60 | Temp 98.4°F | Ht 63.0 in | Wt 161.0 lb

## 2023-09-10 DIAGNOSIS — M7662 Achilles tendinitis, left leg: Secondary | ICD-10-CM | POA: Diagnosis not present

## 2023-09-10 DIAGNOSIS — Z23 Encounter for immunization: Secondary | ICD-10-CM | POA: Diagnosis not present

## 2023-09-10 DIAGNOSIS — Z79899 Other long term (current) drug therapy: Secondary | ICD-10-CM

## 2023-09-10 DIAGNOSIS — F411 Generalized anxiety disorder: Secondary | ICD-10-CM | POA: Diagnosis not present

## 2023-09-10 MED ORDER — ALPRAZOLAM 1 MG PO TABS
ORAL_TABLET | ORAL | 5 refills | Status: DC
Start: 2023-09-10 — End: 2024-01-10

## 2023-09-10 NOTE — Progress Notes (Signed)
Subjective: CC: Chronic follow-up PCP: Cindy Blake is a 67 y.o. female presenting to clinic today for:  1.  Generalized anxiety disorder with panic Patient is continuing to take 1 mg of Xanax roughly every day.  She still not quite ready for taper but wants to have this as a goal for January.  She is continuing to navigate with the murder suicide of her close family friend's child and has been very present in her friend's life, who will now be adopting the child that was left behind after that tragedy.  She reports no changes in memory, no difficulty with excessive daytime sleepiness or falls.  She is off prednisone and seems to be doing well with the Actemra for her GCA.  2.  Heel pain She reports several week history of posterior heel pain on the left side.  This seems to come and go in severity.  She denies any preceding injury.  She suffers from right sided shoulder pain at baseline and is under the care of orthopedics for this.  She has had corticosteroid injections and physical therapy with minimal relief of symptoms as of late   ROS: Per HPI  Allergies  Allergen Reactions   Shellfish-Derived Products Hives   Sulfonamide Derivatives     REACTION: hives, itching, swelling   Past Medical History:  Diagnosis Date   Allergy    Cancer (HCC)    Right Breast   Complication of anesthesia    Family history of adverse reaction to anesthesia    nausea   History of colon polyps    Hypertension    IBS (irritable bowel syndrome)    Mitral valve disorder    MVP (mitral valve prolapse)    PONV (postoperative nausea and vomiting)     Current Outpatient Medications:    Acetaminophen (TYLENOL PO), Take by mouth as needed., Disp: , Rfl:    ACTEMRA ACTPEN 162 MG/0.9ML SOAJ, INJECT 1 PEN UNDER THE SKIN EVERY 7 DAYS., Disp: 10.8 mL, Rfl: 0   ascorbic acid (VITAMIN C) 500 MG tablet, Take 500 mg by mouth daily., Disp: , Rfl:    aspirin 81 MG tablet, Take 81 mg  by mouth daily., Disp: , Rfl:    Calcium Carbonate (CALCIUM 600 PO), Take 600 mg by mouth daily., Disp: , Rfl:    cetirizine (ZYRTEC) 10 MG tablet, Take 10 mg by mouth every morning., Disp: , Rfl:    Cholecalciferol (VITAMIN D) 2000 UNITS CAPS, Take 2,000 Units by mouth every morning., Disp: , Rfl:    cyanocobalamin (VITAMIN B12) 1000 MCG tablet, Take 1,000 mcg by mouth daily., Disp: , Rfl:    diphenhydrAMINE (BENADRYL) 25 MG tablet, Take 25 mg by mouth every 6 (six) hours as needed (Congestion)., Disp: , Rfl:    hydrochlorothiazide (HYDRODIURIL) 25 MG tablet, Take 1 tablet (25 mg total) by mouth daily., Disp: 90 tablet, Rfl: 3   losartan (COZAAR) 50 MG tablet, Take 1 tablet (50 mg total) by mouth daily., Disp: 90 tablet, Rfl: 3   potassium chloride (KLOR-CON M10) 10 MEQ tablet, TAKE 1 TABLET TWICE DAILY MON,WED,FRIDAY AND TAKE 1 TABLET DAILY ON TUE,THUR, SAT, AND SUNDAY, Disp: 120 tablet, Rfl: 0   tamoxifen (NOLVADEX) 20 MG tablet, Take 20 mg by mouth at bedtime., Disp: , Rfl:    Zoledronic Acid (ZOMETA IV), Inject 4 mg into the vein every 6 (six) months. Last IV: 07/11/2022, Disp: , Rfl:    ALPRAZolam (XANAX) 1 MG tablet,  TAKE 1/2 TO 1 TABLET BID AS NEEDED FOR ANXIETY & SLEEP, Disp: 45 tablet, Rfl: 5 Social History   Socioeconomic History   Marital status: Married    Spouse name: Not on file   Number of children: Not on file   Years of education: Not on file   Highest education level: Some college, no degree  Occupational History   Not on file  Tobacco Use   Smoking status: Never    Passive exposure: Never   Smokeless tobacco: Never  Vaping Use   Vaping status: Never Used  Substance and Sexual Activity   Alcohol use: Not Currently   Drug use: No   Sexual activity: Not on file  Other Topics Concern   Not on file  Social History Narrative   Not on file   Social Determinants of Health   Financial Resource Strain: Low Risk  (07/12/2023)   Received from [facility]   Overall  Financial Resource Strain (CARDIA)    Difficulty of Paying Living Expenses: Not hard at all  Food Insecurity: No Food Insecurity (07/12/2023)   [facility]   Hunger Vital Sign    Worried About Running Out of Food in the Last Year: Never true    Ran Out of Food in the Last Year: Never true  Transportation Needs: No Transportation Needs (07/12/2023)   Received from [facility] - Transportation    Lack of Transportation (Medical): No    Lack of Transportation (Non-Medical): No  Physical Activity: Insufficiently Active (05/06/2023)   Exercise Vital Sign    Days of Exercise per Week: 3 days    Minutes of Exercise per Session: 30 min  Stress: No Stress Concern Present (05/06/2023)   [employer] of Occupational Health - Occupational Stress Questionnaire    Feeling of Stress : Not at all  Social Connections: Moderately Integrated (05/06/2023)   Social Connection and Isolation Panel [NHANES]    Frequency of Communication with Friends and Family: More than three times a week    Frequency of Social Gatherings with Friends and Family: Three times a week    Attends Religious Services: More than 4 times per year    Active Member of Clubs or Organizations: No    Attends Banker Meetings: Not on file    Marital Status: Married  Intimate Partner Violence: Not At Risk (11/04/2022)   Received from [facility]   HITS    Over the last 12 months how often did your partner physically hurt you?: 1    Over the last 12 months how often did your partner insult you or talk down to you?: 1    Over the last 12 months how often did your partner threaten you with physical harm?: 1    Over the last 12 months how often did your partner scream or curse at you?: 1   Family History  Problem Relation Age of Onset   Cancer Mother    Hypertension Father    Stroke Father    Heart attack Father    Arthritis Father    Hypertension Sister    Hypertension Sister     Lung cancer Brother    Multiple sclerosis Brother    Healthy Daughter     Objective: Office vital signs reviewed. BP 121/61   Pulse 60   Temp 98.4 F (36.9 C)   Ht 5\' 3"  (1.6 m)   Wt 161 lb (73 kg)   SpO2 98%  BMI 28.52 kg/m   Physical Examination:  General: Awake, alert, well nourished, No acute distress HEENT: Sclera white.  Moist mucous membranes.  Moon facies are improving Cardio: regular rate and rhythm, S1S2 heard, no murmurs appreciated Pulm: clear to auscultation bilaterally, no wheezes, rhonchi or rales; normal work of breathing on room air MSK: Ambulating independently with normal gait and station.  She had no gross soft tissue swelling or significant tenderness along the insertion site of the Achilles tendon.  Mild thickening of the tendon however noted. Psych: Mood stable, speech normal, affect appropriate very pleasant, interactive with linear thought process and good eye contact     09/10/2023    1:09 PM 02/06/2023    3:19 PM 10/10/2022    4:36 PM  Depression screen PHQ 2/9  Decreased Interest 0 0 0  Down, Depressed, Hopeless 0 0 0  PHQ - 2 Score 0 0 0  Altered sleeping 0 0   Tired, decreased energy 0 0   Change in appetite 0 0   Feeling bad or failure about yourself  0 0   Trouble concentrating 0 0   Moving slowly or fidgety/restless 0 0   Suicidal thoughts 0 0   PHQ-9 Score 0 0   Difficult doing work/chores Not difficult at all Not difficult at all       09/10/2023    1:09 PM 02/06/2023    3:19 PM 10/10/2022    4:36 PM 07/27/2022   10:37 AM  GAD 7 : Generalized Anxiety Score  Nervous, Anxious, on Edge 0 0 0 0  Control/stop worrying 0 0 0 0  Worry too much - different things 0 0 0 0  Trouble relaxing 0 0 0 0  Restless 0 0 0 0  Easily annoyed or irritable 0 0 0 0  Afraid - awful might happen 0 0 0 0  Total GAD 7 Score 0 0 0 0  Anxiety Difficulty Not difficult at all Not difficult at all Not difficult at all Not difficult at all       Assessment/ Plan: 67 y.o. female   Generalized anxiety disorder - Plan: ToxASSURE Select 13 (MW), Urine, ALPRAZolam (XANAX) 1 MG tablet  Chronic prescription benzodiazepine use - Plan: ToxASSURE Select 13 (MW), Urine  Controlled substance agreement signed - Plan: ToxASSURE Select 13 (MW), Urine  Achilles tendinitis of left lower extremity  Generalized anxiety disorder is fairly stable.  She still has quite a bit going on and she would like to wait until after the first of the year to start tapering from the alprazolam.  I think this is understandable given recent events.  She demonstrates no red flag signs or symptoms right now.  We updated her UDS and CSC as per office policy and the national narcotic database was reviewed with no red flags.  Future order for alprazolam placed on file as she still has 2 remaining refills on current prescription.    Sounds like she is suffering from Achilles tendinitis.  Home physical therapy exercises provided.  We discussed that if symptoms progress, low threshold to have her see either sports medicine clinic or orthopedics for ultrasound and and possible nitroglycerin treatment.  I will see her back sometime in February, sooner if concerns arise   Cindy Ip, DO [facility] [phone]

## 2023-09-10 NOTE — Telephone Encounter (Signed)
Spoke with [facility] rep to check on status of ACTEMRA SQ PA. Per rep this was approved on 09/05/2023 through 09/04/2024. She stated a fax was sent but it was not received by the clinic. I requested the approval to be re-faxed to [phone].   Sofie Rower, PharmD [employer] PGY1

## 2023-09-11 DIAGNOSIS — Z23 Encounter for immunization: Secondary | ICD-10-CM

## 2023-09-11 DIAGNOSIS — F411 Generalized anxiety disorder: Secondary | ICD-10-CM | POA: Diagnosis not present

## 2023-09-12 NOTE — Telephone Encounter (Signed)
Actemra SQ approval letter received. Sent to media tab  Chesley Mires, PharmD, MPH, BCPS, CPP Clinical Pharmacist (Rheumatology and Pulmonology)

## 2023-09-13 LAB — TOXASSURE SELECT 13 (MW), URINE

## 2023-09-16 NOTE — Progress Notes (Signed)
Office Visit Note  Patient: Cindy Blake             Date of Birth: Feb 04, 1956           MRN: [id number]             PCP: Raliegh Ip, DO Referring: Raliegh Ip, DO Visit Date: 09/26/2023 Occupation: @GUAROCC @  Subjective:  Medication management  History of Present Illness: Cindy Blake is a 68 y.o. female with temporal arteritis and osteoporosis.  She is on Actemra 162 mg subcu every 7 days.  Her last injection was today.  She has been off prednisone since July 20, 2023.  She denies any visual changes.  She states she has been having some discomfort in her right shoulder joint.  She states she had calcium deposit in her right shoulder joint per Dr. Aundria Rud.  She has a follow-up appointment coming up.  She had some discomfort in her left ankle for which she has been doing some exercises given by her PCP.  She had her last Zometa infusion in August by her oncologist.  She has a DEXA scan is scheduled in December.  Patient states that she has been working remotely since the onset of COVID-19 pandemic.  She states after the temporal arteritis she has difficulty driving.  She does not like to drive a long distance.  She is also concerned that due to his immunosuppression she would have increased risk of catching the infection.  She is requested to work remotely.    Activities of Daily Living:  Patient reports morning stiffness for a few minutes.   Patient Denies nocturnal pain.  Difficulty dressing/grooming: Denies Difficulty climbing stairs: Denies Difficulty getting out of chair: Reports Difficulty using hands for taps, buttons, cutlery, and/or writing: Denies  Review of Systems  Constitutional:  Negative for fatigue.  HENT:  Negative for mouth sores and mouth dryness.   Eyes:  Negative for dryness.  Respiratory:  Negative for shortness of breath.   Cardiovascular:  Negative for chest pain and palpitations.  Gastrointestinal:  Negative for blood in stool,  constipation and diarrhea.  Endocrine: Negative for increased urination.  Genitourinary:  Negative for involuntary urination.  Musculoskeletal:  Positive for joint pain, joint pain and morning stiffness. Negative for gait problem, joint swelling, myalgias, muscle weakness, muscle tenderness and myalgias.  Skin:  Negative for color change, rash, hair loss and sensitivity to sunlight.  Allergic/Immunologic: Negative for susceptible to infections.  Neurological:  Negative for dizziness and headaches.  Hematological:  Negative for swollen glands.  Psychiatric/Behavioral:  Negative for depressed mood and sleep disturbance. The patient is not nervous/anxious.     PMFS History:  Patient Active Problem List   Diagnosis Date Noted   Osteoporosis 12/08/2019   Malignant neoplasm of left female breast (HCC) 05/01/2016   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) 02/01/2015   Generalized anxiety disorder 11/10/2013   Allergic rhinitis 11/10/2013   Hyperlipemia 07/06/2013   Hypertension 07/06/2013   Vitamin D deficiency 07/06/2013    Past Medical History:  Diagnosis Date   Allergy    Cancer (HCC)    Right Breast   Complication of anesthesia    Family history of adverse reaction to anesthesia    nausea   History of colon polyps    Hypertension    IBS (irritable bowel syndrome)    Mitral valve disorder    MVP (mitral valve prolapse)    PONV (postoperative nausea and vomiting)  Family History  Problem Relation Age of Onset   Cancer Mother    Hypertension Father    Stroke Father    Heart attack Father    Arthritis Father    Hypertension Sister    Hypertension Sister    Lung cancer Brother    Multiple sclerosis Brother    Healthy Daughter    Past Surgical History:  Procedure Laterality Date   ABDOMINAL HYSTERECTOMY  age 40   APPENDECTOMY     ARTERY BIOPSY Right 09/21/2022   Procedure: Right BIOPSY TEMPORAL ARTERY;  Surgeon: Maeola Harman, MD;  Location:  [facility] OR;  Service: Vascular;  Laterality: Right;   CHOLECYSTECTOMY     LASIK  2000   SHOULDER SURGERY Right    bone spur   Social History   Social History Narrative   Not on file   Immunization History  Administered Date(s) Administered   Fluad Quad(high Dose 65+) 10/10/2022   Fluad Trivalent(High Dose 65+) 09/11/2023   Influenza Inj Mdck Quad With Preservative 09/19/2020   Influenza,inj,Quad PF,6+ Mos 10/11/2015, 09/03/2016, 09/26/2017, 10/08/2018, 10/05/2019   Influenza-Unspecified 09/22/2020, 09/27/2021   Moderna Covid-19 Fall Seasonal Vaccine 62yrs & older 12/24/2022   PFIZER(Purple Top)SARS-COV-2 Vaccination 02/14/2020, 03/06/2020, 09/30/2020, 09/08/2021   Pneumococcal Conjugate-13 01/09/2017   Pneumococcal Polysaccharide-23 07/07/2021   Tdap 06/05/2018   Zoster Recombinant(Shingrix) 07/07/2021, 10/10/2021     Objective: Vital Signs: BP (!) 148/75 (BP Location: Right Arm, Patient Position: Sitting, Cuff Size: Normal)   Pulse 65   Resp 14   Ht 5\' 3"  (1.6 m)   Wt 164 lb 3.2 oz (74.5 kg)   BMI 29.09 kg/m    Physical Exam Vitals and nursing note reviewed.  Constitutional:      Appearance: She is well-developed.  HENT:     Head: Normocephalic and atraumatic.  Eyes:     Conjunctiva/sclera: Conjunctivae normal.  Cardiovascular:     Rate and Rhythm: Normal rate and regular rhythm.     Heart sounds: Normal heart sounds.  Pulmonary:     Effort: Pulmonary effort is normal.     Breath sounds: Normal breath sounds.  Abdominal:     General: Bowel sounds are normal.     Palpations: Abdomen is soft.  Musculoskeletal:     Cervical back: Normal range of motion.  Lymphadenopathy:     Cervical: No cervical adenopathy.  Skin:    General: Skin is warm and dry.     Capillary Refill: Capillary refill takes less than 2 seconds.  Neurological:     Mental Status: She is alert and oriented to person, place, and time.  Psychiatric:        Behavior: Behavior normal.       Musculoskeletal Exam: Cervical, thoracic and lumbar spine 1 good range of motion.  She had no temporal artery tenderness.  Shoulders, elbows, wrist joints, MCPs PIPs and DIPs were in good range of motion.  She had some discomfort range of motion of her right shoulder joint.  Hip joints, knee joints, ankles, MTPs and PIPs were in good range of motion without any synovitis.  No muscular weakness or tenderness was noted.  CDAI Exam: CDAI Score: -- Patient Global: --; Provider Global: -- Swollen: --; Tender: -- Joint Exam 09/26/2023   No joint exam has been documented for this visit   There is currently no information documented on the homunculus. Go to the Rheumatology activity and complete the homunculus joint exam.  Investigation: No additional findings.  Imaging: No results found.  Recent Labs: Lab Results  Component Value Date   WBC 3.5 08/05/2023   HGB 12.9 08/05/2023   PLT 156 08/05/2023   NA 144 08/05/2023   K 3.5 08/05/2023   CL 104 08/05/2023   CO2 24 08/05/2023   GLUCOSE 80 08/05/2023   BUN 15 08/05/2023   CREATININE 0.84 08/05/2023   BILITOT 0.5 08/05/2023   ALKPHOS 49 08/05/2023   AST 26 08/05/2023   ALT 20 08/05/2023   PROT 5.9 (L) 08/05/2023   ALBUMIN 4.4 08/05/2023   CALCIUM 8.8 08/05/2023   GFRAA 99 07/06/2020   QFTBGOLDPLUS NEGATIVE 09/26/2022    Speciality Comments: Ophthalmologist-[facility] Samantha Dunnington  Procedures:  No procedures performed Allergies: Shellfish-derived products and Sulfonamide derivatives   Assessment / Plan:     Visit Diagnoses: Temporal arteritis (HCC) - Biopsy-proven with right eye vision loss.  Diagnosed October 2023.  Patient has been off prednisone since July 20, 2023.  She has been on Actemra since October 04, 2022.  She has been tolerating Actemra without any side effects.  She had no recurrence of temporal arteritis.  She denies any temporal artery tenderness on the examination today.  No muscular weakness or  tenderness was noted.  High risk medication use - Actemra 162 mg subcu every 7 days.  She has been off prednisone since July 20, 2023.  Labs obtained on August 05, 2023 CBC and CMP were normal.  TB Gold was negative on September 26, 2022.  She was advised to get repeat labs in November and every 3 months.  Will check TB Gold with her next labs.- Plan: QuantiFERON-TB Gold Plus.  Information on immunization was placed in the AVS.  She was advised to hold Actemra if she develops an infection. Patient has been working from home since the onset of the COVID-19 pandemic.  She is concerned about him driving due to her impaired vision and also immunosuppression.  She is requested accommodations to work from home.  I was in agreement with the patient.  Long term (current) use of systemic steroids-she was on prednisone from September 2023 until August 2024.  Amaurosis fugax of right eye - Followed by Dr. Hanley Ben at [facility].  Patient has an appointment coming up soon.  Age-related osteoporosis without current pathological fracture - Zometa IV x 2 by the oncologist.  Last infusion February 2024.  Vitamin D deficiency-vitamin D was 59 on September 26, 2022.  Primary hypertension-blood pressure was elevated at 148/70 5 repeat blood pressure was 149/75.  Patient states her blood pressure has been running normal at home.  She is on losartan.  Generalized anxiety disorder  Pure hypercholesterolemia  Malignant neoplasm of upper-inner quadrant of left breast in female, estrogen receptor positive (HCC) - 2016 lumpectomy, LN resection, RTX and tamoxifen, followed by [facility] oncology.  Orders: Orders Placed This Encounter  Procedures   QuantiFERON-TB Gold Plus   No orders of the defined types were placed in this encounter.    Follow-Up Instructions: Return for Osteoporosis.   Pollyann Savoy, MD  Note - This record has been created using Animal nutritionist.  Chart creation errors have been sought,  but may not always  have been located. Such creation errors do not reflect on  the standard of medical care.

## 2023-09-17 ENCOUNTER — Telehealth: Payer: Self-pay | Admitting: Rheumatology

## 2023-09-17 ENCOUNTER — Encounter: Payer: Self-pay | Admitting: Family Medicine

## 2023-09-17 NOTE — Telephone Encounter (Signed)
Patient called stating she has filed an "alternative work request for disability" so she can continue to work from home.  Patient states [employer] will be faxing/mailing the forms to the office for Dr. Corliss Skains to complete.  Patient states she is scheduled for an appointment on 09/26/23 and can discuss it at her appointment.

## 2023-09-26 ENCOUNTER — Ambulatory Visit: Payer: BC Managed Care – PPO | Attending: Rheumatology | Admitting: Rheumatology

## 2023-09-26 ENCOUNTER — Encounter: Payer: Self-pay | Admitting: Rheumatology

## 2023-09-26 VITALS — BP 149/75 | HR 60 | Resp 14 | Ht 63.0 in | Wt 164.2 lb

## 2023-09-26 DIAGNOSIS — E559 Vitamin D deficiency, unspecified: Secondary | ICD-10-CM

## 2023-09-26 DIAGNOSIS — M316 Other giant cell arteritis: Secondary | ICD-10-CM | POA: Diagnosis not present

## 2023-09-26 DIAGNOSIS — Z170 Estrogen receptor positive status [ER+]: Secondary | ICD-10-CM

## 2023-09-26 DIAGNOSIS — E7800 Pure hypercholesterolemia, unspecified: Secondary | ICD-10-CM

## 2023-09-26 DIAGNOSIS — Z79899 Other long term (current) drug therapy: Secondary | ICD-10-CM | POA: Diagnosis not present

## 2023-09-26 DIAGNOSIS — Z7952 Long term (current) use of systemic steroids: Secondary | ICD-10-CM

## 2023-09-26 DIAGNOSIS — M810 Age-related osteoporosis without current pathological fracture: Secondary | ICD-10-CM

## 2023-09-26 DIAGNOSIS — C50212 Malignant neoplasm of upper-inner quadrant of left female breast: Secondary | ICD-10-CM

## 2023-09-26 DIAGNOSIS — G453 Amaurosis fugax: Secondary | ICD-10-CM | POA: Diagnosis not present

## 2023-09-26 DIAGNOSIS — F411 Generalized anxiety disorder: Secondary | ICD-10-CM

## 2023-09-26 DIAGNOSIS — I10 Essential (primary) hypertension: Secondary | ICD-10-CM

## 2023-09-26 NOTE — Patient Instructions (Signed)
Standing Labs We placed an order today for your standing lab work.   Please have your standing labs drawn in November and every 3 months  Please have your labs drawn 2 weeks prior to your appointment so that the provider can discuss your lab results at your appointment, if possible.  Please note that you may see your imaging and lab results in MyChart before we have reviewed them. We will contact you once all results are reviewed. Please allow our office up to 72 hours to thoroughly review all of the results before contacting the office for clarification of your results.  WALK-IN LAB HOURS  Monday through Thursday from 8:00 am -12:30 pm and 1:00 pm-5:00 pm and Friday from 8:00 am-12:00 pm.  Patients with office visits requiring labs will be seen before walk-in labs.  You may encounter longer than normal wait times. Please allow additional time. Wait times may be shorter on  Monday and Thursday afternoons.  We do not book appointments for walk-in labs. We appreciate your patience and understanding with our staff.   Labs are drawn by [facility]. Please bring your co-pay at the time of your lab draw.  You may receive a bill from [facility] for your lab work.  Please note if you are on Hydroxychloroquine and and an order has been placed for a Hydroxychloroquine level,  you will need to have it drawn 4 hours or more after your last dose.  If you wish to have your labs drawn at another location, please call the office 24 hours in advance so we can fax the orders.  The office is located at [address], [address], [place], [place] [address]   If you have any questions regarding directions or hours of operation,  please call [phone].   As a reminder, please drink plenty of water prior to coming for your lab work. Thanks!   Vaccines You are taking a medication(s) that can suppress your immune system.  The following immunizations are recommended: Flu annually Covid-19 RSV  Td/Tdap (tetanus,  diphtheria, pertussis) every 10 years Pneumonia (Prevnar 15 then Pneumovax 23 at least 1 year apart.  Alternatively, can take Prevnar 20 without needing additional dose) Shingrix: 2 doses from 4 weeks to 6 months apart  Please check with your PCP to make sure you are up to date.  If you have signs or symptoms of an infection or start antibiotics: First, call your PCP for workup of your infection. Hold your medication through the infection, until you complete your antibiotics, and until symptoms resolve if you take the following: Injectable medication (Actemra, Benlysta, Cimzia, Cosentyx, Enbrel, Humira, Kevzara, Orencia, Remicade, Simponi, Stelara, Taltz, Tremfya) Methotrexate Leflunomide (Arava) Mycophenolate (Cellcept) Harriette Ohara, Olumiant, or Rinvoq

## 2023-10-09 ENCOUNTER — Telehealth: Payer: Self-pay | Admitting: *Deleted

## 2023-10-09 NOTE — Telephone Encounter (Signed)
Patient contacted the office and left message asking if we have received the medical verification form for [employer]. She states they were supposed to have faxed it to our office on 10/01/2023. She is requesting to work from home.   Reached out to patient to advise we have not received her paperwork. Advised patient to have paperwork faxed to our office and we would complete the paperwork as advised per Dr. Fatima Sanger last note.

## 2023-10-17 DIAGNOSIS — M7531 Calcific tendinitis of right shoulder: Secondary | ICD-10-CM | POA: Diagnosis not present

## 2023-11-04 ENCOUNTER — Other Ambulatory Visit: Payer: Self-pay | Admitting: Physician Assistant

## 2023-11-04 ENCOUNTER — Other Ambulatory Visit: Payer: Self-pay | Admitting: *Deleted

## 2023-11-04 DIAGNOSIS — Z79899 Other long term (current) drug therapy: Secondary | ICD-10-CM

## 2023-11-04 NOTE — Addendum Note (Signed)
Addended by: Henriette Combs on: 11/04/2023 10:51 AM   Modules accepted: Orders

## 2023-11-04 NOTE — Telephone Encounter (Signed)
Last Fill: 08/19/2023  Labs: 08/05/2023 CBC and CMP are stable.   TB Gold: 09/26/2022 Neg   Next Visit: 02/06/2024  Last Visit: 09/26/2023  [id number] arteritis   Current Dose per office note 09/26/2023: Actemra 162 mg subcu every 7 days   Patient advised she is due to update labs. Patient will update them tomorrow.   Okay to refill Actemra?

## 2023-11-05 ENCOUNTER — Other Ambulatory Visit: Payer: BC Managed Care – PPO

## 2023-11-05 DIAGNOSIS — Z79899 Other long term (current) drug therapy: Secondary | ICD-10-CM | POA: Diagnosis not present

## 2023-11-06 ENCOUNTER — Other Ambulatory Visit: Payer: Self-pay | Admitting: *Deleted

## 2023-11-06 DIAGNOSIS — Z79899 Other long term (current) drug therapy: Secondary | ICD-10-CM

## 2023-11-06 DIAGNOSIS — M316 Other giant cell arteritis: Secondary | ICD-10-CM

## 2023-11-06 DIAGNOSIS — Z7952 Long term (current) use of systemic steroids: Secondary | ICD-10-CM

## 2023-11-06 NOTE — Progress Notes (Signed)
Potassium is low.  Notified patient and report results to her PCP.  CBC is normal except platelets are mildly decreased.  Will continue to monitor labs.  She should have repeat labs in 3 months as planned.  TB Gold is pending.

## 2023-11-09 LAB — CMP14+EGFR
ALT: 17 [IU]/L (ref 0–32)
AST: 22 [IU]/L (ref 0–40)
Albumin: 4.2 g/dL (ref 3.9–4.9)
Alkaline Phosphatase: 54 [IU]/L (ref 44–121)
BUN/Creatinine Ratio: 16 (ref 12–28)
BUN: 13 mg/dL (ref 8–27)
Bilirubin Total: 0.5 mg/dL (ref 0.0–1.2)
CO2: 26 mmol/L (ref 20–29)
Calcium: 9 mg/dL (ref 8.7–10.3)
Chloride: 101 mmol/L (ref 96–106)
Creatinine, Ser: 0.83 mg/dL (ref 0.57–1.00)
Globulin, Total: 1.5 g/dL (ref 1.5–4.5)
Glucose: 68 mg/dL — ABNORMAL LOW (ref 70–99)
Potassium: 3.4 mmol/L — ABNORMAL LOW (ref 3.5–5.2)
Sodium: 140 mmol/L (ref 134–144)
Total Protein: 5.7 g/dL — ABNORMAL LOW (ref 6.0–8.5)
eGFR: 77 mL/min/{1.73_m2} (ref >59)

## 2023-11-09 LAB — CBC WITH DIFFERENTIAL/PLATELET
Basophils Absolute: 0.1 10*3/uL (ref 0.0–0.2)
Basos: 1 %
EOS (ABSOLUTE): 0.1 10*3/uL (ref 0.0–0.4)
Eos: 4 %
Hematocrit: 39.4 % (ref 34.0–46.6)
Hemoglobin: 13 g/dL (ref 11.1–15.9)
Immature Grans (Abs): 0 10*3/uL (ref 0.0–0.1)
Immature Granulocytes: 1 %
Lymphocytes Absolute: 1.5 10*3/uL (ref 0.7–3.1)
Lymphs: 37 %
MCH: 30.7 pg (ref 26.6–33.0)
MCHC: 33 g/dL (ref 31.5–35.7)
MCV: 93 fL (ref 79–97)
Monocytes Absolute: 0.5 10*3/uL (ref 0.1–0.9)
Monocytes: 12 %
Neutrophils Absolute: 1.8 10*3/uL (ref 1.4–7.0)
Neutrophils: 45 %
Platelets: 145 10*3/uL — ABNORMAL LOW (ref 150–450)
RBC: 4.23 x10E6/uL (ref 3.77–5.28)
RDW: 12.3 % (ref 11.7–15.4)
WBC: 4 10*3/uL (ref 3.4–10.8)

## 2023-11-09 LAB — QUANTIFERON-TB GOLD PLUS
QuantiFERON Mitogen Value: >10 [IU]/mL
QuantiFERON Nil Value: 0.02 [IU]/mL
QuantiFERON TB1 Ag Value: 0.09 [IU]/mL
QuantiFERON TB2 Ag Value: 0.09 [IU]/mL
QuantiFERON-TB Gold Plus: NEGATIVE

## 2023-11-11 DIAGNOSIS — Z01419 Encounter for gynecological examination (general) (routine) without abnormal findings: Secondary | ICD-10-CM | POA: Diagnosis not present

## 2023-11-11 DIAGNOSIS — Z6827 Body mass index (BMI) 27.0-27.9, adult: Secondary | ICD-10-CM | POA: Diagnosis not present

## 2023-11-11 DIAGNOSIS — Z853 Personal history of malignant neoplasm of breast: Secondary | ICD-10-CM | POA: Diagnosis not present

## 2023-11-11 DIAGNOSIS — Z90710 Acquired absence of both cervix and uterus: Secondary | ICD-10-CM | POA: Diagnosis not present

## 2023-11-11 NOTE — Progress Notes (Signed)
TB gold is negative.

## 2023-11-25 DIAGNOSIS — Z1231 Encounter for screening mammogram for malignant neoplasm of breast: Secondary | ICD-10-CM | POA: Diagnosis not present

## 2023-11-25 DIAGNOSIS — R92323 Mammographic fibroglandular density, bilateral breasts: Secondary | ICD-10-CM | POA: Diagnosis not present

## 2023-11-25 DIAGNOSIS — C50212 Malignant neoplasm of upper-inner quadrant of left female breast: Secondary | ICD-10-CM | POA: Diagnosis not present

## 2023-11-25 DIAGNOSIS — Z170 Estrogen receptor positive status [ER+]: Secondary | ICD-10-CM | POA: Diagnosis not present

## 2023-11-25 DIAGNOSIS — M85851 Other specified disorders of bone density and structure, right thigh: Secondary | ICD-10-CM | POA: Diagnosis not present

## 2023-11-25 DIAGNOSIS — M810 Age-related osteoporosis without current pathological fracture: Secondary | ICD-10-CM | POA: Diagnosis not present

## 2023-11-25 LAB — HM MAMMOGRAPHY

## 2024-01-10 ENCOUNTER — Encounter: Payer: Self-pay | Admitting: Family Medicine

## 2024-01-10 ENCOUNTER — Ambulatory Visit: Payer: BC Managed Care – PPO | Admitting: Family Medicine

## 2024-01-10 VITALS — BP 132/63 | HR 96 | Temp 98.3°F | Ht 63.0 in | Wt 157.0 lb

## 2024-01-10 DIAGNOSIS — M810 Age-related osteoporosis without current pathological fracture: Secondary | ICD-10-CM | POA: Diagnosis not present

## 2024-01-10 DIAGNOSIS — F411 Generalized anxiety disorder: Secondary | ICD-10-CM | POA: Diagnosis not present

## 2024-01-10 DIAGNOSIS — Z79899 Other long term (current) drug therapy: Secondary | ICD-10-CM | POA: Diagnosis not present

## 2024-01-10 DIAGNOSIS — M316 Other giant cell arteritis: Secondary | ICD-10-CM | POA: Diagnosis not present

## 2024-01-10 DIAGNOSIS — J069 Acute upper respiratory infection, unspecified: Secondary | ICD-10-CM

## 2024-01-10 MED ORDER — ALPRAZOLAM 1 MG PO TABS: ORAL_TABLET | ORAL | 5 refills | Status: DC

## 2024-01-10 NOTE — Progress Notes (Signed)
Subjective: CC: Follow-up anxiety PCP: Raliegh Ip, DO [id number] Cindy Blake is a 68 y.o. female presenting to clinic today for:  1.  Generalized anxiety disorder with panic Patient has been taking 1 tablet of Xanax at bedtime pretty consistently.  She notes that overall her stress has gotten better since her last visit and she is ready to start tapering from the medication.  She does not report any memory changes.  2.  Giant cell arteritis She reports no change in vision in the right side.  She pretty much is just lost vision on that right side.  There is a cataract on the left that she is hoping is "right" so that she can get this removed and hopefully gain some better vision on the left side.  3.  URI with cough She reports few day history of URI with cough.  She has been using Mucinex.  Saline spray.  No hemoptysis, shortness of breath or wheezing.  4.  Osteoporosis Going to get zoledronic acid treatment soon.  Compliant with vitamin D supplementation.   ROS: Per HPI  Allergies  Allergen Reactions   Shellfish-Derived Products Hives   Sulfonamide Derivatives     REACTION: hives, itching, swelling   Past Medical History:  Diagnosis Date   Allergy    Cancer (HCC)    Right Breast   Complication of anesthesia    Family history of adverse reaction to anesthesia    nausea   History of colon polyps    Hypertension    IBS (irritable bowel syndrome)    Mitral valve disorder    MVP (mitral valve prolapse)    PONV (postoperative nausea and vomiting)     Current Outpatient Medications:    Acetaminophen (TYLENOL PO), Take by mouth as needed., Disp: , Rfl:    ACTEMRA ACTPEN 162 MG/0.9ML SOAJ, INJECT 1 PEN UNDER THE SKIN EVERY 7 DAYS, Disp: 10.8 mL, Rfl: 0   ALPRAZolam (XANAX) 1 MG tablet, TAKE 1/2 TO 1 TABLET BID AS NEEDED FOR ANXIETY & SLEEP, Disp: 45 tablet, Rfl: 5   ascorbic acid (VITAMIN C) 500 MG tablet, Take 500 mg by mouth daily., Disp: , Rfl:    aspirin 81 MG  tablet, Take 81 mg by mouth daily., Disp: , Rfl:    Calcium Carbonate (CALCIUM 600 PO), Take 600 mg by mouth daily., Disp: , Rfl:    cetirizine (ZYRTEC) 10 MG tablet, Take 10 mg by mouth every morning., Disp: , Rfl:    Cholecalciferol (VITAMIN D) 2000 UNITS CAPS, Take 2,000 Units by mouth every morning., Disp: , Rfl:    cyanocobalamin (VITAMIN B12) 1000 MCG tablet, Take 1,000 mcg by mouth daily., Disp: , Rfl:    diphenhydrAMINE (BENADRYL) 25 MG tablet, Take 25 mg by mouth every 6 (six) hours as needed (Congestion)., Disp: , Rfl:    hydrochlorothiazide (HYDRODIURIL) 25 MG tablet, Take 1 tablet (25 mg total) by mouth daily., Disp: 90 tablet, Rfl: 3   losartan (COZAAR) 50 MG tablet, Take 1 tablet (50 mg total) by mouth daily., Disp: 90 tablet, Rfl: 3   potassium chloride (KLOR-CON M10) 10 MEQ tablet, TAKE 1 TABLET TWICE DAILY MON,WED,FRIDAY AND TAKE 1 TABLET DAILY ON TUE,THUR, SAT, AND SUNDAY, Disp: 120 tablet, Rfl: 0   tamoxifen (NOLVADEX) 20 MG tablet, Take 20 mg by mouth at bedtime., Disp: , Rfl:    Zoledronic Acid (ZOMETA IV), Inject 4 mg into the vein every 6 (six) months. Last IV: 07/12/2023, Disp: , Rfl:  Social  History   Socioeconomic History   Marital status: Married    Spouse name: Not on file   Number of children: Not on file   Years of education: Not on file   Highest education level: Some college, no degree  Occupational History   Not on file  Tobacco Use   Smoking status: Never    Passive exposure: Never   Smokeless tobacco: Never  Vaping Use   Vaping status: Never Used  Substance and Sexual Activity   Alcohol use: Not Currently   Drug use: No   Sexual activity: Not on file  Other Topics Concern   Not on file  Social History Narrative   Not on file   Social Drivers of Health   Financial Resource Strain: Low Risk  (01/06/2024)   Overall Financial Resource Strain (CARDIA)    Difficulty of Paying Living Expenses: Not hard at all  Food Insecurity: No Food Insecurity  (01/06/2024)   Hunger Vital Sign    Worried About Running Out of Food in the Last Year: Never true    Ran Out of Food in the Last Year: Never true  Transportation Needs: No Transportation Needs (01/06/2024)   PRAPARE - Administrator, Civil Service (Medical): No    Lack of Transportation (Non-Medical): No  Physical Activity: Insufficiently Active (01/06/2024)   Exercise Vital Sign    Days of Exercise per Week: 3 days    Minutes of Exercise per Session: 30 min  Stress: No Stress Concern Present (01/06/2024)   [employer] of Occupational Health - Occupational Stress Questionnaire    Feeling of Stress : Not at all  Social Connections: Moderately Integrated (01/06/2024)   Social Connection and Isolation Panel [NHANES]    Frequency of Communication with Friends and Family: More than three times a week    Frequency of Social Gatherings with Friends and Family: Twice a week    Attends Religious Services: More than 4 times per year    Active Member of [employer] or Organizations: No    Attends Banker Meetings: Not on file    Marital Status: Married  Catering manager Violence: Not At Risk (11/06/2023)   Received from [facility]   HITS    Over the last 12 months how often did your partner physically hurt you?: Never    Over the last 12 months how often did your partner insult you or talk down to you?: Never    Over the last 12 months how often did your partner threaten you with physical harm?: Never    Over the last 12 months how often did your partner scream or curse at you?: Never   Family History  Problem Relation Age of Onset   Cancer Mother    Hypertension Father    Stroke Father    Heart attack Father    Arthritis Father    Hypertension Sister    Hypertension Sister    Lung cancer Brother    Multiple sclerosis Brother    Healthy Daughter     Objective: Office vital signs reviewed. BP 132/63   Pulse 96   Temp 98.3 F (36.8 C)   Ht 5\' 3"  (1.6 m)    Wt 157 lb (71.2 kg)   SpO2 99%   BMI 27.81 kg/m   Physical Examination:  General: Awake, alert, well nourished, No acute distress HEENT: Normal    Neck: No masses palpated. No lymphadenopathy    Ears: Tympanic membranes intact, normal light reflex,  no erythema, no bulging    Eyes: PERRLA, extraocular membranes intact, sclera white    Nose: nasal turbinates moist, no nasal discharge    Throat: moist mucus membranes, no erythema, no tonsillar exudate.  Airway is patent Cardio: regular rate and rhythm, S1S2 heard, no murmurs appreciated Pulm: clear to auscultation bilaterally, no wheezes, rhonchi or rales; normal work of breathing on room air     01/10/2024   11:22 AM 09/10/2023    1:09 PM 02/06/2023    3:19 PM  Depression screen PHQ 2/9  Decreased Interest 0 0 0  Down, Depressed, Hopeless 0 0 0  PHQ - 2 Score 0 0 0  Altered sleeping 0 0 0  Tired, decreased energy 0 0 0  Change in appetite 0 0 0  Feeling bad or failure about yourself  0 0 0  Trouble concentrating 0 0 0  Moving slowly or fidgety/restless 0 0 0  Suicidal thoughts 0 0 0  PHQ-9 Score 0 0 0  Difficult doing work/chores Not difficult at all Not difficult at all Not difficult at all      01/10/2024   11:22 AM 09/10/2023    1:09 PM 02/06/2023    3:19 PM 10/10/2022    4:36 PM  GAD 7 : Generalized Anxiety Score  Nervous, Anxious, on Edge 0 0 0 0  Control/stop worrying 0 0 0 0  Worry too much - different things 0 0 0 0  Trouble relaxing 0 0 0 0  Restless 0 0 0 0  Easily annoyed or irritable 0 0 0 0  Afraid - awful might happen 0 0 0 0  Total GAD 7 Score 0 0 0 0  Anxiety Difficulty Not difficult at all Not difficult at all Not difficult at all Not difficult at all        Assessment/ Plan: 68 y.o. female   Generalized anxiety disorder - Plan: ALPRAZolam (XANAX) 1 MG tablet  Chronic prescription benzodiazepine use - Plan: ALPRAZolam (XANAX) 1 MG tablet  GCA (giant cell arteritis) (HCC)  Age-related  osteoporosis without current pathological fracture  Viral URI  Reduce to 1/2 tablet at bedtime.  I have given her the option to go to 1/2 to 1 tablet q. OD if needed.  She will contact me if she is having any breakthrough sleep issues and we can consider something like trazodone, doxepin etc.  Will plan to see her back again in 3 to 4 months for ongoing taper, sooner if concerns arise.  The national narcotic database reviewed and there were no red flags and she is up-to-date on UDS and CSC  GCA is chronic and stable.  Keep follow-up with specialist as directed  Declined getting labs done today as this will be obtained by her specialist before zoledronic acid administration in a few weeks  URI appears to be viral and is resolving.  We discussed home care instructions and reasons for reevaluation.  She voiced good understanding   Shakedra Beam Hulen Skains, DO [facility] [phone]

## 2024-01-15 DIAGNOSIS — Z1502 Genetic susceptibility to malignant neoplasm of ovary: Secondary | ICD-10-CM | POA: Diagnosis not present

## 2024-01-15 DIAGNOSIS — M810 Age-related osteoporosis without current pathological fracture: Secondary | ICD-10-CM | POA: Diagnosis not present

## 2024-01-15 DIAGNOSIS — Z1501 Genetic susceptibility to malignant neoplasm of breast: Secondary | ICD-10-CM | POA: Diagnosis not present

## 2024-01-15 DIAGNOSIS — Z1589 Genetic susceptibility to other disease: Secondary | ICD-10-CM | POA: Diagnosis not present

## 2024-01-15 DIAGNOSIS — Z1509 Genetic susceptibility to other malignant neoplasm: Secondary | ICD-10-CM | POA: Diagnosis not present

## 2024-01-15 DIAGNOSIS — Z170 Estrogen receptor positive status [ER+]: Secondary | ICD-10-CM | POA: Diagnosis not present

## 2024-01-15 DIAGNOSIS — Z79810 Long term (current) use of selective estrogen receptor modulators (SERMs): Secondary | ICD-10-CM | POA: Diagnosis not present

## 2024-01-15 DIAGNOSIS — C50212 Malignant neoplasm of upper-inner quadrant of left female breast: Secondary | ICD-10-CM | POA: Diagnosis not present

## 2024-01-17 DIAGNOSIS — H54415A Blindness right eye category 5, normal vision left eye: Secondary | ICD-10-CM | POA: Diagnosis not present

## 2024-01-17 DIAGNOSIS — H5213 Myopia, bilateral: Secondary | ICD-10-CM | POA: Diagnosis not present

## 2024-01-17 DIAGNOSIS — H47011 Ischemic optic neuropathy, right eye: Secondary | ICD-10-CM | POA: Diagnosis not present

## 2024-01-17 DIAGNOSIS — H2513 Age-related nuclear cataract, bilateral: Secondary | ICD-10-CM | POA: Diagnosis not present

## 2024-01-17 DIAGNOSIS — H40012 Open angle with borderline findings, low risk, left eye: Secondary | ICD-10-CM | POA: Diagnosis not present

## 2024-01-28 DIAGNOSIS — M316 Other giant cell arteritis: Secondary | ICD-10-CM | POA: Insufficient documentation

## 2024-01-28 DIAGNOSIS — Z79899 Other long term (current) drug therapy: Secondary | ICD-10-CM | POA: Insufficient documentation

## 2024-01-28 NOTE — Progress Notes (Signed)
 Office Visit Note  Patient: Cindy Blake             Date of Birth: [birth year]           MRN: [id number]             PCP: Raliegh Ip, DO Referring: Raliegh Ip, DO Visit Date: 02/06/2024 Occupation: @GUAROCC @  Subjective:  Medication management  History of Present Illness: Cindy Blake is a 68 y.o. female with temporal arteritis and osteoporosis.  She is on Actemra 162 mg subcu every 7 days.  She had no interruption in the treatment since the last visit.  She has been tolerating Actemra without any side effects.  She denies any joint pain.  She states she was evaluated by her ophthalmologist and there have been no change in her vision.  She is scheduled to have cataract surgery in the near future.  She will proceed with the left and followed by the right eye surgery.  She has been getting Zometa infusions through her oncologist.  She had a DEXA scan in December 2024.Cindy Blake    Activities of Daily Living:  Patient reports morning stiffness for few minutes.   Patient Denies nocturnal pain.  Difficulty dressing/grooming: Denies Difficulty climbing stairs: Denies Difficulty getting out of chair: Denies Difficulty using hands for taps, buttons, cutlery, and/or writing: Denies  Review of Systems  Constitutional:  Negative for fatigue.  HENT:  Negative for mouth sores and mouth dryness.   Eyes:  Positive for dryness.  Respiratory:  Negative for shortness of breath.   Cardiovascular:  Negative for chest pain and palpitations.  Gastrointestinal:  Negative for blood in stool, constipation and diarrhea.  Endocrine: Negative for increased urination.  Genitourinary:  Negative for involuntary urination.  Musculoskeletal:  Positive for myalgias, morning stiffness and myalgias. Negative for joint pain, gait problem, joint pain, joint swelling, muscle weakness and muscle tenderness.  Skin:  Negative for color change, rash, hair loss and sensitivity to sunlight.   Allergic/Immunologic: Negative for susceptible to infections.  Neurological:  Negative for dizziness and headaches.  Hematological:  Negative for swollen glands.  Psychiatric/Behavioral:  Negative for depressed mood and sleep disturbance. The patient is not nervous/anxious.     PMFS History:  Patient Active Problem List   Diagnosis Date Noted   Temporal arteritis (HCC) 01/28/2024   High risk medication use 01/28/2024   Osteoporosis 12/08/2019   Malignant neoplasm of left female breast (HCC) 05/01/2016   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) 02/01/2015   Generalized anxiety disorder 11/10/2013   Allergic rhinitis 11/10/2013   Hyperlipemia 07/06/2013   Hypertension 07/06/2013   Vitamin D deficiency 07/06/2013    Past Medical History:  Diagnosis Date   Allergy    Cancer (HCC)    Right Breast   Complication of anesthesia    Family history of adverse reaction to anesthesia    nausea   History of colon polyps    Hypertension    IBS (irritable bowel syndrome)    Mitral valve disorder    MVP (mitral valve prolapse)    PONV (postoperative nausea and vomiting)     Family History  Problem Relation Age of Onset   Cancer Mother    Hypertension Father    Stroke Father    Heart attack Father    Arthritis Father    Hypertension Sister    Hypertension Sister    Lung cancer Brother    Multiple sclerosis Brother  Healthy Daughter    Past Surgical History:  Procedure Laterality Date   ABDOMINAL HYSTERECTOMY  age 80   APPENDECTOMY     ARTERY BIOPSY Right 09/21/2022   Procedure: Right BIOPSY TEMPORAL ARTERY;  Surgeon: Maeola Harman, MD;  Location: [facility] OR;  Service: Vascular;  Laterality: Right;   CHOLECYSTECTOMY     LASIK  2000   SHOULDER SURGERY Right    bone spur   Social History   Social History Narrative   Not on file   Immunization History  Administered Date(s) Administered   Fluad Quad(high Dose 65+) 10/10/2022   Fluad Trivalent(High  Dose 65+) 09/11/2023   Influenza Inj Mdck Quad With Preservative 09/19/2020   Influenza,inj,Quad PF,6+ Mos 10/11/2015, 09/03/2016, 09/26/2017, 10/08/2018, 10/05/2019   Influenza-Unspecified 09/22/2020, 09/27/2021   Moderna Covid-19 Fall Seasonal Vaccine 27yrs & older 12/24/2022, 11/05/2023   PFIZER(Purple Top)SARS-COV-2 Vaccination 02/14/2020, 03/06/2020, 09/30/2020, 09/08/2021   Pneumococcal Conjugate-13 01/09/2017   Pneumococcal Polysaccharide-23 07/07/2021   Tdap 06/05/2018   Zoster Recombinant(Shingrix) 07/07/2021, 10/10/2021     Objective: Vital Signs: BP (!) 161/84 (BP Location: Right Arm, Patient Position: Sitting, Cuff Size: Small)   Pulse 74   Resp 12   Ht 5\' 3"  (1.6 m)   Wt 160 lb (72.6 kg)   BMI 28.34 kg/m    Physical Exam Vitals and nursing note reviewed.  Constitutional:      Appearance: She is well-developed.  HENT:     Head: Normocephalic and atraumatic.  Eyes:     Conjunctiva/sclera: Conjunctivae normal.  Cardiovascular:     Rate and Rhythm: Normal rate and regular rhythm.     Heart sounds: Normal heart sounds.  Pulmonary:     Effort: Pulmonary effort is normal.     Breath sounds: Normal breath sounds.  Abdominal:     General: Bowel sounds are normal.     Palpations: Abdomen is soft.  Musculoskeletal:     Cervical back: Normal range of motion.  Lymphadenopathy:     Cervical: No cervical adenopathy.  Skin:    General: Skin is warm and dry.     Capillary Refill: Capillary refill takes less than 2 seconds.  Neurological:     Mental Status: She is alert and oriented to person, place, and time.  Psychiatric:        Behavior: Behavior normal.      Musculoskeletal Exam: Cervical, thoracic and lumbar spine 1 good range of motion.  Shoulders, elbow joints, wrist joints, MCPs PIPs and DIPs Juengel range of motion with no synovitis.  Hip joints, knee joints, ankles, MTPs were in good range of motion with no synovitis.  No muscular weakness or tenderness was  noted.  CDAI Exam: CDAI Score: -- Patient Global: --; Provider Global: -- Swollen: --; Tender: -- Joint Exam 02/06/2024   No joint exam has been documented for this visit   There is currently no information documented on the homunculus. Go to the Rheumatology activity and complete the homunculus joint exam.  Investigation: No additional findings.  Imaging: No results found.  Recent Labs: Lab Results  Component Value Date   WBC 4.0 11/05/2023   HGB 13.0 11/05/2023   PLT 145 (L) 11/05/2023   NA 140 11/05/2023   K 3.4 (L) 11/05/2023   CL 101 11/05/2023   CO2 26 11/05/2023   GLUCOSE 68 (L) 11/05/2023   BUN 13 11/05/2023   CREATININE 0.83 11/05/2023   BILITOT 0.5 11/05/2023   ALKPHOS 54 11/05/2023   AST 22 11/05/2023  ALT 17 11/05/2023   PROT 5.7 (L) 11/05/2023   ALBUMIN 4.2 11/05/2023   CALCIUM 9.0 11/05/2023   GFRAA 99 07/06/2020   QFTBGOLDPLUS Negative 11/05/2023      Speciality Comments: Ophthalmologist-[facility] Samantha Dunnington  Procedures:  No procedures performed Allergies: Shellfish-derived products and Sulfonamide derivatives   Assessment / Plan:     Visit Diagnoses: Temporal arteritis (HCC) - Biopsy-proven with right eye vision loss.  Dxd October 2023.  Patient has been off prednisone since July 20, 2023.  She has been on Actemra since10/26/23.  She has been tolerating Actemra weekly without any side effects.  She denies any interruption in the treatment.  She had no temporal artery tenderness.  She denies any headaches or muscular weakness.  Amaurosis fugax of right eye - Followed by Dr. Hanley Ben at [facility].  Patient reports no improvement in her right eye vision.  She also mentioned that she will be going under cataract surgery in the next couple of months.  High risk medication use - Actemra 162 mg subcu every 7 days.  TB Gold  negative , CMP normal, CBC normal except low platelets at 145 on 11/05/2023. -We will repeat labs today.   TB Gold was negative on November 05, 2023.  She was advised to get labs every 3 months.  I will also check lipid panel today.  Plan: CBC with Differential/Platelet, COMPLETE METABOLIC PANEL WITH GFR.  Information on immunization was placed in the AVS.  She was advised to hold Actemra if she develops an infection resume after the infection resolves.  Age-related osteoporosis without current pathological fracture - Zometa IV x 2 by the oncologist.  Last infusion February 2024.November 25, 2023 the bone mineral density as measured at the left radius 33% region of interest is 0.613 g/cm2. T-score: -3.0   Vitamin D deficiency-last vitamin D was normal at 83 on September 26, 2022.  Primary hypertension-blood pressure was elevated at 161/84 today.  She was advised to monitor pressure closely and follow-up with the PCP.  Pure hypercholesterolemia - Plan: Lipid panel  Malignant neoplasm of upper-inner quadrant of left breast in female, estrogen receptor positive (HCC) - 2016 lumpectomy, LN resection, RTX and tamoxifen, followed by [facility] oncology.  Generalized anxiety disorder  Orders: Orders Placed This Encounter  Procedures   CBC with Differential/Platelet   COMPLETE METABOLIC PANEL WITH GFR   Lipid panel   No orders of the defined types were placed in this encounter.    Follow-Up Instructions: Return in about 3 months (around 05/05/2024) for Temporal arteritis.   Pollyann Savoy, MD  Note - This record has been created using Animal nutritionist.  Chart creation errors have been sought, but may not always  have been located. Such creation errors do not reflect on  the standard of medical care.

## 2024-01-30 ENCOUNTER — Other Ambulatory Visit: Payer: Self-pay | Admitting: Physician Assistant

## 2024-01-30 NOTE — Telephone Encounter (Signed)
Last Fill: 11/04/2023  Labs: 01/15/2024 Platelet Count 117, MPV 11.0, Potassium 3.4, Total Protein 6.2, AST 42  TB Gold: 11/05/2023 Neg    Next Visit: 02/06/2024  Last Visit: 09/26/2023  ZO:XWRUEAVW arteritis   Current Dose per office note 09/26/2023: Actemra 162 mg subcu every 7 days   Okay to refill Actemra?

## 2024-02-02 ENCOUNTER — Other Ambulatory Visit: Payer: Self-pay | Admitting: Family Medicine

## 2024-02-02 DIAGNOSIS — E876 Hypokalemia: Secondary | ICD-10-CM

## 2024-02-06 ENCOUNTER — Encounter: Payer: Self-pay | Admitting: Rheumatology

## 2024-02-06 ENCOUNTER — Ambulatory Visit: Payer: BC Managed Care – PPO | Attending: Rheumatology | Admitting: Rheumatology

## 2024-02-06 VITALS — BP 161/84 | HR 74 | Resp 12 | Ht 63.0 in | Wt 160.0 lb

## 2024-02-06 DIAGNOSIS — Z79899 Other long term (current) drug therapy: Secondary | ICD-10-CM

## 2024-02-06 DIAGNOSIS — M316 Other giant cell arteritis: Secondary | ICD-10-CM

## 2024-02-06 DIAGNOSIS — E559 Vitamin D deficiency, unspecified: Secondary | ICD-10-CM

## 2024-02-06 DIAGNOSIS — C50212 Malignant neoplasm of upper-inner quadrant of left female breast: Secondary | ICD-10-CM

## 2024-02-06 DIAGNOSIS — Z170 Estrogen receptor positive status [ER+]: Secondary | ICD-10-CM

## 2024-02-06 DIAGNOSIS — I10 Essential (primary) hypertension: Secondary | ICD-10-CM

## 2024-02-06 DIAGNOSIS — F411 Generalized anxiety disorder: Secondary | ICD-10-CM

## 2024-02-06 DIAGNOSIS — G453 Amaurosis fugax: Secondary | ICD-10-CM | POA: Diagnosis not present

## 2024-02-06 DIAGNOSIS — E7800 Pure hypercholesterolemia, unspecified: Secondary | ICD-10-CM

## 2024-02-06 DIAGNOSIS — M810 Age-related osteoporosis without current pathological fracture: Secondary | ICD-10-CM

## 2024-02-06 NOTE — Patient Instructions (Addendum)
 Standing Labs We placed an order today for your standing lab work.   Please have your standing labs drawn in  May and every 3 months  Please have your labs drawn 2 weeks prior to your appointment so that the provider can discuss your lab results at your appointment, if possible.  Please note that you may see your imaging and lab results in MyChart before we have reviewed them. We will contact you once all results are reviewed. Please allow our office up to 72 hours to thoroughly review all of the results before contacting the office for clarification of your results.  WALK-IN LAB HOURS  Monday through Thursday from 8:00 am -12:30 pm and 1:00 pm-5:00 pm and Friday from 8:00 am-12:00 pm.  Patients with office visits requiring labs will be seen before walk-in labs.  You may encounter longer than normal wait times. Please allow additional time. Wait times may be shorter on  Monday and Thursday afternoons.  We do not book appointments for walk-in labs. We appreciate your patience and understanding with our staff.   Labs are drawn by [facility]. Please bring your co-pay at the time of your lab draw.  You may receive a bill from [facility] for your lab work.  Please note if you are on Hydroxychloroquine and and an order has been placed for a Hydroxychloroquine level,  you will need to have it drawn 4 hours or more after your last dose.  If you wish to have your labs drawn at another location, please call the office 24 hours in advance so we can fax the orders.  The office is located at [address], [address], [place], [place] [address]   If you have any questions regarding directions or hours of operation,  please call [phone].   As a reminder, please drink plenty of water prior to coming for your lab work. Thanks!   Vaccines You are taking a medication(s) that can suppress your immune system.  The following immunizations are recommended: Flu annually Covid-19  RSV Td/Tdap (tetanus,  diphtheria, pertussis) every 10 years Pneumonia (Prevnar 15 then Pneumovax 23 at least 1 year apart.  Alternatively, can take Prevnar 20 without needing additional dose) Shingrix: 2 doses from 4 weeks to 6 months apart  Please check with your PCP to make sure you are up to date.   If you have signs or symptoms of an infection or start antibiotics: First, call your PCP for workup of your infection. Hold your medication through the infection, until you complete your antibiotics, and until symptoms resolve if you take the following: Injectable medication (Actemra, Benlysta, Cimzia, Cosentyx, Enbrel, Humira, Kevzara, Orencia, Remicade, Simponi, Stelara, Taltz, Tremfya) Methotrexate Leflunomide (Arava) Mycophenolate (Cellcept) Harriette Ohara, Olumiant, or Rinvoq

## 2024-02-07 ENCOUNTER — Encounter: Payer: Self-pay | Admitting: Family Medicine

## 2024-02-07 ENCOUNTER — Other Ambulatory Visit: Payer: Self-pay

## 2024-02-07 ENCOUNTER — Telehealth: Payer: Self-pay

## 2024-02-07 DIAGNOSIS — E876 Hypokalemia: Secondary | ICD-10-CM

## 2024-02-07 LAB — COMPLETE METABOLIC PANEL WITH GFR
AG Ratio: 2.6 (calc) — ABNORMAL HIGH (ref 1.0–2.5)
ALT: 26 U/L (ref 6–29)
AST: 30 U/L (ref 10–35)
Albumin: 4.4 g/dL (ref 3.6–5.1)
Alkaline phosphatase (APISO): 47 U/L (ref 37–153)
BUN: 10 mg/dL (ref 7–25)
CO2: 24 mmol/L (ref 20–32)
Calcium: 8.7 mg/dL (ref 8.6–10.4)
Chloride: 104 mmol/L (ref 98–110)
Creat: 0.76 mg/dL (ref 0.50–1.05)
Globulin: 1.7 g/dL — ABNORMAL LOW (ref 1.9–3.7)
Glucose, Bld: 77 mg/dL (ref 65–99)
Potassium: 3.1 mmol/L — ABNORMAL LOW (ref 3.5–5.3)
Sodium: 143 mmol/L (ref 135–146)
Total Bilirubin: 0.6 mg/dL (ref 0.2–1.2)
Total Protein: 6.1 g/dL (ref 6.1–8.1)
eGFR: 86 mL/min/{1.73_m2} (ref >=60)

## 2024-02-07 LAB — CBC WITH DIFFERENTIAL/PLATELET
Absolute Lymphocytes: 2004 {cells}/uL (ref 850–3900)
Absolute Monocytes: 541 {cells}/uL (ref 200–950)
Basophils Absolute: 71 {cells}/uL (ref 0–200)
Basophils Relative: 1.4 %
Eosinophils Absolute: 128 {cells}/uL (ref 15–500)
Eosinophils Relative: 2.5 %
HCT: 40.1 % (ref 35.0–45.0)
Hemoglobin: 13.2 g/dL (ref 11.7–15.5)
MCH: 30.3 pg (ref 27.0–33.0)
MCHC: 32.9 g/dL (ref 32.0–36.0)
MCV: 92 fL (ref 80.0–100.0)
MPV: 13 fL — ABNORMAL HIGH (ref 7.5–12.5)
Monocytes Relative: 10.6 %
Neutro Abs: 2356 {cells}/uL (ref 1500–7800)
Neutrophils Relative %: 46.2 %
Platelets: 102 10*3/uL — ABNORMAL LOW (ref 140–400)
RBC: 4.36 10*6/uL (ref 3.80–5.10)
RDW: 11.9 % (ref 11.0–15.0)
Total Lymphocyte: 39.3 %
WBC: 5.1 10*3/uL (ref 3.8–10.8)

## 2024-02-07 LAB — LIPID PANEL
Cholesterol: 213 mg/dL — ABNORMAL HIGH (ref <200)
HDL: 90 mg/dL (ref >=50)
LDL Cholesterol (Calc): 106 mg/dL — ABNORMAL HIGH
Non-HDL Cholesterol (Calc): 123 mg/dL (ref <130)
Total CHOL/HDL Ratio: 2.4 (calc) (ref <5.0)
Triglycerides: 82 mg/dL (ref <150)

## 2024-02-07 NOTE — Telephone Encounter (Signed)
 Copied from CRM [id number]. Topic: Clinical - Request for Lab/Test Order >> Feb 07, 2024  2:29 PM Herbert Seta B wrote: Reason for CRM: Patient has appointment in lab for BMP bloodwork on 3/7 per PCP request. Patient needs to know if she should fast, labs need ordered as well. [phone]

## 2024-02-07 NOTE — Telephone Encounter (Signed)
 Future lab orders placed and patient notified that she does not have to fast for these repeat labs. Patient verbalized understanding

## 2024-02-07 NOTE — Progress Notes (Signed)
 Platelets are low at 102.  Potassium is low at 3.1.  LDL is mildly elevated.  Please notify patient of low potassium.  She should contact her PCPs office.  Please notify patient's PCP of low potassium.  Please forward lab results to her PCP and oncologist.

## 2024-02-14 ENCOUNTER — Other Ambulatory Visit: Payer: BC Managed Care – PPO

## 2024-02-14 DIAGNOSIS — E876 Hypokalemia: Secondary | ICD-10-CM | POA: Diagnosis not present

## 2024-02-15 ENCOUNTER — Encounter: Payer: Self-pay | Admitting: Family Medicine

## 2024-02-15 ENCOUNTER — Other Ambulatory Visit: Payer: Self-pay | Admitting: Family Medicine

## 2024-02-15 DIAGNOSIS — E876 Hypokalemia: Secondary | ICD-10-CM

## 2024-02-15 LAB — BMP8+EGFR
BUN/Creatinine Ratio: 17 (ref 12–28)
BUN: 15 mg/dL (ref 8–27)
CO2: 24 mmol/L (ref 20–29)
Calcium: 9.1 mg/dL (ref 8.7–10.3)
Chloride: 104 mmol/L (ref 96–106)
Creatinine, Ser: 0.87 mg/dL (ref 0.57–1.00)
Glucose: 78 mg/dL (ref 70–99)
Potassium: 3.5 mmol/L (ref 3.5–5.2)
Sodium: 143 mmol/L (ref 134–144)
eGFR: 73 mL/min/{1.73_m2} (ref >59)

## 2024-02-15 MED ORDER — POTASSIUM CHLORIDE CRYS ER 20 MEQ PO TBCR: 20.0000 meq | EXTENDED_RELEASE_TABLET | Freq: Every day | ORAL | 3 refills | Status: AC

## 2024-03-18 DIAGNOSIS — H52222 Regular astigmatism, left eye: Secondary | ICD-10-CM | POA: Diagnosis not present

## 2024-03-18 DIAGNOSIS — H47011 Ischemic optic neuropathy, right eye: Secondary | ICD-10-CM | POA: Diagnosis not present

## 2024-03-18 DIAGNOSIS — H54415A Blindness right eye category 5, normal vision left eye: Secondary | ICD-10-CM | POA: Diagnosis not present

## 2024-03-18 DIAGNOSIS — H25813 Combined forms of age-related cataract, bilateral: Secondary | ICD-10-CM | POA: Diagnosis not present

## 2024-03-18 DIAGNOSIS — H40012 Open angle with borderline findings, low risk, left eye: Secondary | ICD-10-CM | POA: Diagnosis not present

## 2024-03-18 DIAGNOSIS — H5212 Myopia, left eye: Secondary | ICD-10-CM | POA: Diagnosis not present

## 2024-03-18 DIAGNOSIS — H524 Presbyopia: Secondary | ICD-10-CM | POA: Diagnosis not present

## 2024-03-24 DIAGNOSIS — H25812 Combined forms of age-related cataract, left eye: Secondary | ICD-10-CM | POA: Diagnosis not present

## 2024-03-31 ENCOUNTER — Telehealth: Payer: Self-pay | Admitting: *Deleted

## 2024-03-31 NOTE — Telephone Encounter (Signed)
 Patient advised Dr. Alvira Josephs would not recommend to hold Actemra  due to previous history of temporal arteritis and vision loss. She may also discuss with her ophthalmologist. If there is a concern of infection, her ophthalmologist may use antibiotics.

## 2024-03-31 NOTE — Telephone Encounter (Signed)
 Patient contacted the office and left message stating she is having cataract surgery next Thursday. Patient is on Actemra  every 7 days. Patient would like to know how she should hold her Actemra .

## 2024-03-31 NOTE — Telephone Encounter (Signed)
 I would not recommend to hold Actemra  due to previous history of temporal arteritis and vision loss.  She may also discuss with her ophthalmologist.  If there is a concern of infection, her ophthalmologist may use antibiotics.

## 2024-04-06 ENCOUNTER — Encounter: Payer: Self-pay | Admitting: Family Medicine

## 2024-04-07 DIAGNOSIS — H268 Other specified cataract: Secondary | ICD-10-CM | POA: Diagnosis not present

## 2024-04-07 DIAGNOSIS — H25812 Combined forms of age-related cataract, left eye: Secondary | ICD-10-CM | POA: Diagnosis not present

## 2024-04-07 HISTORY — PX: CATARACT EXTRACTION, BILATERAL: SHX1313

## 2024-04-08 ENCOUNTER — Ambulatory Visit: Payer: BC Managed Care – PPO | Admitting: Family Medicine

## 2024-04-08 ENCOUNTER — Encounter: Payer: Self-pay | Admitting: Family Medicine

## 2024-04-08 VITALS — BP 139/60 | HR 60 | Temp 98.7°F | Ht 63.0 in | Wt 155.6 lb

## 2024-04-08 DIAGNOSIS — Z79899 Other long term (current) drug therapy: Secondary | ICD-10-CM

## 2024-04-08 DIAGNOSIS — F411 Generalized anxiety disorder: Secondary | ICD-10-CM

## 2024-04-08 MED ORDER — ALPRAZOLAM 1 MG PO TABS: ORAL_TABLET | ORAL | 5 refills | Status: DC

## 2024-04-08 NOTE — Progress Notes (Signed)
 Subjective: CC: Generalized anxiety disorder with insomnia PCP: Eliodoro Guerin, DO Cindy Blake is a 68 y.o. female presenting to clinic today for:  1.  Generalized anxiety disorder with insomnia Patient here for follow-up on benzodiazepine taper.  At last visit we discussed having her go down to 1/2 tablet at bedtime and potentially every other day if tolerated.  She reports to me today that she is currently taking 0.75 mg at bedtime.  This seems to be doing okay for her.  She has been on this regimen for about a month now.  She had cataract surgery on the left eye but has done well so far   ROS: Per HPI  Allergies  Allergen Reactions   Shellfish-Derived Products Hives   Sulfonamide Derivatives     REACTION: hives, itching, swelling   Past Medical History:  Diagnosis Date   Allergy    Cancer (HCC)    Right Breast   Complication of anesthesia    Family history of adverse reaction to anesthesia    nausea   History of colon polyps    Hypertension    IBS (irritable bowel syndrome)    Mitral valve disorder    MVP (mitral valve prolapse)    PONV (postoperative nausea and vomiting)     Current Outpatient Medications:    Acetaminophen  (TYLENOL  PO), Take by mouth as needed., Disp: , Rfl:    ACTEMRA  ACTPEN 162 MG/0.9ML SOAJ, INJECT 1 PEN UNDER THE SKIN EVERY 7 DAYS, Disp: 10.8 mL, Rfl: 0   ALPRAZolam  (XANAX ) 1 MG tablet, TAKE 1/2 TO 1 TABLET qhs AS NEEDED FOR ANXIETY & SLEEP. Initiating taper, Disp: 30 tablet, Rfl: 5   ascorbic acid (VITAMIN C) 500 MG tablet, Take 500 mg by mouth daily., Disp: , Rfl:    aspirin 81 MG tablet, Take 81 mg by mouth daily., Disp: , Rfl:    Calcium Carbonate (CALCIUM 600 PO), Take 600 mg by mouth daily., Disp: , Rfl:    cetirizine (ZYRTEC) 10 MG tablet, Take 10 mg by mouth every morning., Disp: , Rfl:    Cholecalciferol (VITAMIN D ) 2000 UNITS CAPS, Take 2,000 Units by mouth every morning., Disp: , Rfl:    cyanocobalamin (VITAMIN B12) 1000  MCG tablet, Take 1,000 mcg by mouth daily., Disp: , Rfl:    diphenhydrAMINE  (BENADRYL ) 25 MG tablet, Take 25 mg by mouth every 6 (six) hours as needed (Congestion)., Disp: , Rfl:    hydrochlorothiazide  (HYDRODIURIL ) 25 MG tablet, Take 1 tablet (25 mg total) by mouth daily., Disp: 90 tablet, Rfl: 3   losartan  (COZAAR ) 50 MG tablet, Take 1 tablet (50 mg total) by mouth daily., Disp: 90 tablet, Rfl: 3   potassium chloride  SA (KLOR-CON  M) 20 MEQ tablet, Take 1 tablet (20 mEq total) by mouth daily. To replace 10 MEQ, Disp: 90 tablet, Rfl: 3   tamoxifen (NOLVADEX) 20 MG tablet, Take 20 mg by mouth at bedtime., Disp: , Rfl:    Zoledronic  Acid (ZOMETA  IV), Inject 4 mg into the vein every 6 (six) months. Last IV: 07/12/2023, Disp: , Rfl:  Social History   Socioeconomic History   Marital status: Married    Spouse name: Not on file   Number of children: Not on file   Years of education: Not on file   Highest education level: Some college, no degree  Occupational History   Not on file  Tobacco Use   Smoking status: Never    Passive exposure: Never   Smokeless tobacco:  Never  Vaping Use   Vaping status: Never Used  Substance and Sexual Activity   Alcohol use: Not Currently   Drug use: No   Sexual activity: Not on file  Other Topics Concern   Not on file  Social History Narrative   Not on file   Social Drivers of Health   Financial Resource Strain: Low Risk  (01/06/2024)   Overall Financial Resource Strain (CARDIA)    Difficulty of Paying Living Expenses: Not hard at all  Food Insecurity: No Food Insecurity (01/06/2024)   Hunger Vital Sign    Worried About Running Out of Food in the Last Year: Never true    Ran Out of Food in the Last Year: Never true  Transportation Needs: No Transportation Needs (01/06/2024)   PRAPARE - Administrator, Civil Service (Medical): No    Lack of Transportation (Non-Medical): No  Physical Activity: Insufficiently Active (01/06/2024)   Exercise Vital  Sign    Days of Exercise per Week: 3 days    Minutes of Exercise per Session: 30 min  Stress: No Stress Concern Present (01/06/2024)   [employer] of Occupational Health - Occupational Stress Questionnaire    Feeling of Stress : Not at all  Social Connections: Moderately Integrated (01/06/2024)   Social Connection and Isolation Panel [NHANES]    Frequency of Communication with Friends and Family: More than three times a week    Frequency of Social Gatherings with Friends and Family: Twice a week    Attends Religious Services: More than 4 times per year    Active Member of [employer] or Organizations: No    Attends Banker Meetings: Not on file    Marital Status: Married  Catering manager Violence: Not At Risk (11/06/2023)   Received from [facility]   HITS    Over the last 12 months how often did your partner physically hurt you?: Never    Over the last 12 months how often did your partner insult you or talk down to you?: Never    Over the last 12 months how often did your partner threaten you with physical harm?: Never    Over the last 12 months how often did your partner scream or curse at you?: Never   Family History  Problem Relation Age of Onset   Cancer Mother    Hypertension Father    Stroke Father    Heart attack Father    Arthritis Father    Hypertension Sister    Hypertension Sister    Lung cancer Brother    Multiple sclerosis Brother    Healthy Daughter     Objective: Office vital signs reviewed. BP 139/60   Pulse 60   Temp 98.7 F (37.1 C)   Ht 5\' 3"  (1.6 m)   Wt 155 lb 9.6 oz (70.6 kg)   SpO2 99%   BMI 27.56 kg/m   Physical Examination:  General: Awake, alert, well nourished, No acute distress HEENT: Left pupil dilated Cardio: regular rate and rhythm, S1S2 heard, no murmurs appreciated Pulm: clear to auscultation bilaterally, no wheezes, rhonchi or rales; normal work of breathing on room air      04/08/2024    2:02 PM 01/10/2024    11:22 AM 09/10/2023    1:09 PM  Depression screen PHQ 2/9  Decreased Interest 0 0 0  Down, Depressed, Hopeless 0 0 0  PHQ - 2 Score 0 0 0  Altered sleeping 0 0 0  Tired, decreased  energy 0 0 0  Change in appetite 0 0 0  Feeling bad or failure about yourself  0 0 0  Trouble concentrating 0 0 0  Moving slowly or fidgety/restless 0 0 0  Suicidal thoughts 0 0 0  PHQ-9 Score 0 0 0  Difficult doing work/chores Not difficult at all Not difficult at all Not difficult at all      04/08/2024    2:01 PM 01/10/2024   11:22 AM 09/10/2023    1:09 PM 02/06/2023    3:19 PM  GAD 7 : Generalized Anxiety Score  Nervous, Anxious, on Edge 0 0 0 0  Control/stop worrying 0 0 0 0  Worry too much - different things 0 0 0 0  Trouble relaxing 0 0 0 0  Restless 0 0 0 0  Easily annoyed or irritable 0 0 0 0  Afraid - awful might happen 0 0 0 0  Total GAD 7 Score 0 0 0 0  Anxiety Difficulty Not difficult at all Not difficult at all Not difficult at all Not difficult at all     Assessment/ Plan: 68 y.o. female   Generalized anxiety disorder - Plan: ALPRAZolam  (XANAX ) 1 MG tablet  Chronic prescription benzodiazepine use - Plan: ALPRAZolam  (XANAX ) 1 MG tablet  I have renewed her medication and she will continue 0.75 mg for another 1 to 2 months and then she will reduce to 0.5 mg at bedtime.  Plan to follow-up for annual physical and benzodiazepine in about 4 months   Cindy Terral M Jacque Byron, DO [facility] [phone]

## 2024-04-08 NOTE — Patient Instructions (Signed)
 Over next 2 months, try and reduce to 1/2 tablet of Xanax  at bedtime

## 2024-04-13 DIAGNOSIS — H25811 Combined forms of age-related cataract, right eye: Secondary | ICD-10-CM | POA: Diagnosis not present

## 2024-04-16 ENCOUNTER — Other Ambulatory Visit: Payer: Self-pay | Admitting: Rheumatology

## 2024-04-16 NOTE — Telephone Encounter (Signed)
 Last Fill: 01/30/2024  Labs: 02/06/2024 Platelets are low at 102.  Potassium is low at 3.1.  LDL is mildly elevated   TB Gold: 11/04/2024 TB gold is negative.   Next Visit: 05/05/2024  Last Visit: 02/06/2024   ZO:XWRUEAVW arteritis   Current Dose per office note 02/06/2024: Actemra  162 mg subcu every 7 days   Patient will update labs at upcoming appointment on 05/05/2024  Okay to refill Actemra ?

## 2024-04-21 DIAGNOSIS — H25811 Combined forms of age-related cataract, right eye: Secondary | ICD-10-CM | POA: Diagnosis not present

## 2024-04-21 DIAGNOSIS — H268 Other specified cataract: Secondary | ICD-10-CM | POA: Diagnosis not present

## 2024-04-22 ENCOUNTER — Other Ambulatory Visit: Payer: Self-pay | Admitting: *Deleted

## 2024-04-22 ENCOUNTER — Other Ambulatory Visit: Payer: Self-pay | Admitting: Family Medicine

## 2024-04-22 DIAGNOSIS — I10 Essential (primary) hypertension: Secondary | ICD-10-CM

## 2024-04-22 DIAGNOSIS — Z79899 Other long term (current) drug therapy: Secondary | ICD-10-CM

## 2024-04-22 LAB — CBC WITH DIFFERENTIAL/PLATELET
Absolute Lymphocytes: 1382 {cells}/uL (ref 850–3900)
Absolute Monocytes: 378 {cells}/uL (ref 200–950)
Basophils Absolute: 40 {cells}/uL (ref 0–200)
Basophils Relative: 1.1 %
Eosinophils Absolute: 90 {cells}/uL (ref 15–500)
Eosinophils Relative: 2.5 %
HCT: 38.9 % (ref 35.0–45.0)
Hemoglobin: 12.7 g/dL (ref 11.7–15.5)
MCH: 30 pg (ref 27.0–33.0)
MCHC: 32.6 g/dL (ref 32.0–36.0)
MCV: 92 fL (ref 80.0–100.0)
MPV: 11.9 fL (ref 7.5–12.5)
Monocytes Relative: 10.5 %
Neutro Abs: 1710 {cells}/uL (ref 1500–7800)
Neutrophils Relative %: 47.5 %
Platelets: 147 10*3/uL (ref 140–400)
RBC: 4.23 10*6/uL (ref 3.80–5.10)
RDW: 12 % (ref 11.0–15.0)
Total Lymphocyte: 38.4 %
WBC: 3.6 10*3/uL — ABNORMAL LOW (ref 3.8–10.8)

## 2024-04-22 LAB — COMPREHENSIVE METABOLIC PANEL WITH GFR
AG Ratio: 2.6 (calc) — ABNORMAL HIGH (ref 1.0–2.5)
ALT: 20 U/L (ref 6–29)
AST: 24 U/L (ref 10–35)
Albumin: 4.4 g/dL (ref 3.6–5.1)
Alkaline phosphatase (APISO): 46 U/L (ref 37–153)
BUN: 19 mg/dL (ref 7–25)
CO2: 29 mmol/L (ref 20–32)
Calcium: 8.9 mg/dL (ref 8.6–10.4)
Chloride: 105 mmol/L (ref 98–110)
Creat: 0.86 mg/dL (ref 0.50–1.05)
Globulin: 1.7 g/dL — ABNORMAL LOW (ref 1.9–3.7)
Glucose, Bld: 75 mg/dL (ref 65–99)
Potassium: 3.6 mmol/L (ref 3.5–5.3)
Sodium: 143 mmol/L (ref 135–146)
Total Bilirubin: 0.6 mg/dL (ref 0.2–1.2)
Total Protein: 6.1 g/dL (ref 6.1–8.1)
eGFR: 74 mL/min/{1.73_m2} (ref >=60)

## 2024-04-22 NOTE — Progress Notes (Signed)
 Office Visit Note  Patient: Cindy Blake             Date of Birth: [birth year]           MRN: [id number]             PCP: Eliodoro Guerin, DO Referring: Eliodoro Guerin, DO Visit Date: 05/05/2024 Occupation: @GUAROCC @  Subjective:  Medication management  History of Present Illness: Cindy Blake is a 68 y.o. female with temporal arteritis and osteoporosis.  She returns today after her last visit in February.  She states she underwent bilateral cataract surgery. The left eye April 07, 2024, and right eye Apr 21, 2024 Dr. Allison Ivory.  She had remarkable improvement in the vision in her left eye but not much in her right eye.  She remains on Actemra  162 mg subcu every 7 days without any interruption.  She has not noticed any headaches or muscle weakness or tenderness.  Her last Zometa  infusion was in February by her oncologist.  Been taking calcium and vitamin D .  She continues to work from home due to her immunosuppression and limited vision.  Activities of Daily Living:  Patient reports morning stiffness for a few minutes.   Patient Reports nocturnal pain.  Difficulty dressing/grooming: Denies Difficulty climbing stairs: Denies Difficulty getting out of chair: Denies Difficulty using hands for taps, buttons, cutlery, and/or writing: Denies  Review of Systems  Constitutional:  Negative for fatigue.  HENT:  Positive for mouth dryness. Negative for mouth sores.   Eyes:  Negative for dryness.  Respiratory:  Negative for shortness of breath.   Cardiovascular:  Negative for chest pain and palpitations.  Gastrointestinal:  Negative for blood in stool, constipation and diarrhea.  Endocrine: Negative for increased urination.  Genitourinary:  Negative for involuntary urination.  Musculoskeletal:  Positive for morning stiffness. Negative for joint pain, gait problem, joint pain, joint swelling, myalgias, muscle weakness, muscle tenderness and myalgias.  Skin:  Negative for color change,  rash, hair loss and sensitivity to sunlight.  Allergic/Immunologic: Negative for susceptible to infections.  Neurological:  Negative for dizziness and headaches.  Hematological:  Negative for swollen glands.  Psychiatric/Behavioral:  Positive for sleep disturbance. Negative for depressed mood. The patient is not nervous/anxious.     PMFS History:  Patient Active Problem List   Diagnosis Date Noted   Temporal arteritis (HCC) 01/28/2024   High risk medication use 01/28/2024   Osteoporosis 12/08/2019   Malignant neoplasm of left female breast (HCC) 05/01/2016   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) 02/01/2015   Generalized anxiety disorder 11/10/2013   Allergic rhinitis 11/10/2013   Hyperlipemia 07/06/2013   Hypertension 07/06/2013   Vitamin D  deficiency 07/06/2013    Past Medical History:  Diagnosis Date   Allergy    Cancer (HCC)    Right Breast   Complication of anesthesia    Family history of adverse reaction to anesthesia    nausea   History of colon polyps    Hypertension    IBS (irritable bowel syndrome)    Mitral valve disorder    MVP (mitral valve prolapse)    PONV (postoperative nausea and vomiting)     Family History  Problem Relation Age of Onset   Cancer Mother    Hypertension Father    Stroke Father    Heart attack Father    Arthritis Father    Hypertension Sister    Hypertension Sister    Lung cancer Brother  Multiple sclerosis Brother    Healthy Daughter    Past Surgical History:  Procedure Laterality Date   ABDOMINAL HYSTERECTOMY  age 26   APPENDECTOMY     ARTERY BIOPSY Right 09/21/2022   Procedure: Right BIOPSY TEMPORAL ARTERY;  Surgeon: Adine Hoof, MD;  [facility];  Service: Vascular;  Laterality: Right;   CATARACT EXTRACTION, BILATERAL  04/07/2024   04/21/2024   CHOLECYSTECTOMY     LASIK  2000   SHOULDER SURGERY Right    bone spur   Social History   Social History Narrative   Not on file    Immunization History  Administered Date(s) Administered   Fluad Quad(high Dose 65+) 10/10/2022   Fluad Trivalent(High Dose 65+) 09/11/2023   Influenza Inj Mdck Quad With Preservative 09/19/2020   Influenza,inj,Quad PF,6+ Mos 10/11/2015, 09/03/2016, 09/26/2017, 10/08/2018, 10/05/2019   Influenza-Unspecified 09/22/2020, 09/27/2021   Moderna Covid-19 Fall Seasonal Vaccine 49yrs & older 12/24/2022, 11/05/2023   PFIZER(Purple Top)SARS-COV-2 Vaccination 02/14/2020, 03/06/2020, 09/30/2020, 09/08/2021   Pneumococcal Conjugate-13 01/09/2017   Pneumococcal Polysaccharide-23 07/07/2021   Tdap 06/05/2018   Zoster Recombinant(Shingrix) 07/07/2021, 10/10/2021     Objective: Vital Signs: BP 133/75 (BP Location: Left Arm, Patient Position: Sitting, Cuff Size: Normal)   Pulse 62   Resp 14   Ht 5\' 3"  (1.6 m)   Wt 157 lb 3.2 oz (71.3 kg)   BMI 27.85 kg/m    Physical Exam Vitals and nursing note reviewed.  Constitutional:      Appearance: She is well-developed.  HENT:     Head: Normocephalic and atraumatic.  Eyes:     Conjunctiva/sclera: Conjunctivae normal.  Cardiovascular:     Rate and Rhythm: Normal rate and regular rhythm.     Heart sounds: Normal heart sounds.  Pulmonary:     Effort: Pulmonary effort is normal.     Breath sounds: Normal breath sounds.  Abdominal:     General: Bowel sounds are normal.     Palpations: Abdomen is soft.  Musculoskeletal:     Cervical back: Normal range of motion.  Lymphadenopathy:     Cervical: No cervical adenopathy.  Skin:    General: Skin is warm and dry.     Capillary Refill: Capillary refill takes less than 2 seconds.  Neurological:     Mental Status: She is alert and oriented to person, place, and time.  Psychiatric:        Behavior: Behavior normal.      Musculoskeletal Exam: Cervical, thoracic and lumbar spine with good range of motion.  Shoulders, elbows, wrist joints, MCPs PIPs and DIPs with good range of motion with no synovitis.   Hip joints and knee joints in good range of motion without any warmth swelling or effusion.  There was no tenderness over ankles or MTPs.  CDAI Exam: CDAI Score: -- Patient Global: --; Provider Global: -- Swollen: --; Tender: -- Joint Exam 05/05/2024   No joint exam has been documented for this visit   There is currently no information documented on the homunculus. Go to the Rheumatology activity and complete the homunculus joint exam.  Investigation: No additional findings.  Imaging: No results found.  Recent Labs: Lab Results  Component Value Date   WBC 3.6 (L) 04/22/2024   HGB 12.7 04/22/2024   PLT 147 04/22/2024   NA 143 04/22/2024   K 3.6 04/22/2024   CL 105 04/22/2024   CO2 29 04/22/2024   GLUCOSE 75 04/22/2024   BUN 19 04/22/2024   CREATININE 0.86 04/22/2024  BILITOT 0.6 04/22/2024   ALKPHOS 54 11/05/2023   AST 24 04/22/2024   ALT 20 04/22/2024   PROT 6.1 04/22/2024   ALBUMIN 4.2 11/05/2023   CALCIUM 8.9 04/22/2024   GFRAA 99 07/06/2020   QFTBGOLDPLUS Negative 11/05/2023    Speciality Comments: Ophthalmologist-[facility] Samantha Dunnington Actemra  10/04/22  Procedures:  No procedures performed Allergies: Shellfish-derived products and Sulfonamide derivatives   Assessment / Plan:     Visit Diagnoses: Temporal arteritis (HCC) - Biopsy-proven with right eye vision loss.  Dxd October 2023.  Patient has been off prednisone  since July 20, 2023.  She has been on Actemra  162 mg subcu every 7 days which she has been tolerating well.  She has had no recurrence of headaches or vision changes.  Amaurosis fugax of right eye - Followed by Dr. Nana Avers at [facility].  She had bilateral cataract surgery few months back and recovered well.  She has a follow-up appointment with Dr. Alto Atta.  High risk medication use - Actemra  162 mg subcu every 7 days.  Apr 22, 2024 WBC count was low at 3.6.  Rest of the labs were unremarkable.  She was advised to get labs in  August and every 3 months to monitor for drug toxicity.  TB Gold was negative in November 2024.  Information reimmunization was placed in the AVS.  She was advised to hold Actemra  if she develops an infection resume after the infection resolves.  Age-related osteoporosis without current pathological fracture - Zometa  IV x 2 by the oncologist.IV 01/2023.11/25/2023 bone mineral density as measured at the left radius 33% region of interest is 0.613 g/cm2. T-score: -3.0  Vitamin D  deficiency-vitamin D  was 83 on September 26, 2022.  Primary hypertension-blood pressure was normal at 133/75.  Malignant neoplasm of upper-inner quadrant of left breast in female, estrogen receptor positive (HCC) - 2016 lumpectomy, LN resection, RTX and tamoxifen, followed by [facility] oncology.  Pure hypercholesterolemia  Generalized anxiety disorder  Orders: No orders of the defined types were placed in this encounter.  No orders of the defined types were placed in this encounter.    Follow-Up Instructions: Return in about 3 months (around 08/05/2024) for TA.   Nicholas Bari, MD  Note - This record has been created using Animal nutritionist.  Chart creation errors have been sought, but may not always  have been located. Such creation errors do not reflect on  the standard of medical care.

## 2024-04-23 ENCOUNTER — Ambulatory Visit: Payer: Self-pay | Admitting: Rheumatology

## 2024-04-23 NOTE — Progress Notes (Signed)
 White cell count is low due to immunosuppression.  CMP is stable.  Will continue to monitor labs every 3 months.

## 2024-05-04 ENCOUNTER — Other Ambulatory Visit: Payer: Self-pay | Admitting: Family Medicine

## 2024-05-04 DIAGNOSIS — I10 Essential (primary) hypertension: Secondary | ICD-10-CM

## 2024-05-05 ENCOUNTER — Encounter: Payer: Self-pay | Admitting: Rheumatology

## 2024-05-05 ENCOUNTER — Ambulatory Visit: Attending: Rheumatology | Admitting: Rheumatology

## 2024-05-05 VITALS — BP 133/75 | HR 62 | Resp 14 | Ht 63.0 in | Wt 157.2 lb

## 2024-05-05 DIAGNOSIS — G453 Amaurosis fugax: Secondary | ICD-10-CM | POA: Diagnosis not present

## 2024-05-05 DIAGNOSIS — I10 Essential (primary) hypertension: Secondary | ICD-10-CM

## 2024-05-05 DIAGNOSIS — M810 Age-related osteoporosis without current pathological fracture: Secondary | ICD-10-CM

## 2024-05-05 DIAGNOSIS — E559 Vitamin D deficiency, unspecified: Secondary | ICD-10-CM

## 2024-05-05 DIAGNOSIS — Z79899 Other long term (current) drug therapy: Secondary | ICD-10-CM

## 2024-05-05 DIAGNOSIS — M316 Other giant cell arteritis: Secondary | ICD-10-CM

## 2024-05-05 DIAGNOSIS — E7800 Pure hypercholesterolemia, unspecified: Secondary | ICD-10-CM

## 2024-05-05 DIAGNOSIS — F411 Generalized anxiety disorder: Secondary | ICD-10-CM

## 2024-05-05 DIAGNOSIS — Z170 Estrogen receptor positive status [ER+]: Secondary | ICD-10-CM

## 2024-05-05 DIAGNOSIS — C50212 Malignant neoplasm of upper-inner quadrant of left female breast: Secondary | ICD-10-CM

## 2024-05-05 NOTE — Patient Instructions (Signed)
 Standing Labs We placed an order today for your standing lab work.   Please have your standing labs drawn in August and every 3 months  Please have your labs drawn 2 weeks prior to your appointment so that the provider can discuss your lab results at your appointment, if possible.  Please note that you may see your imaging and lab results in MyChart before we have reviewed them. We will contact you once all results are reviewed. Please allow our office up to 72 hours to thoroughly review all of the results before contacting the office for clarification of your results.  WALK-IN LAB HOURS  Monday through Thursday from 8:00 am -12:30 pm and 1:00 pm-4:00 pm and Friday from 8:00 am-12:00 pm.  Patients with office visits requiring labs will be seen before walk-in labs.  You may encounter longer than normal wait times. Please allow additional time. Wait times may be shorter on  Monday and Thursday afternoons.  We do not book appointments for walk-in labs. We appreciate your patience and understanding with our staff.   Labs are drawn by [facility]. Please bring your co-pay at the time of your lab draw.  You may receive a bill from [facility] for your lab work.  Please note if you are on Hydroxychloroquine and and an order has been placed for a Hydroxychloroquine level,  you will need to have it drawn 4 hours or more after your last dose.  If you wish to have your labs drawn at another location, please call the office 24 hours in advance so we can fax the orders.  The office is located at [address], [address], [place], [place] [address]   If you have any questions regarding directions or hours of operation,  please call [phone].   As a reminder, please drink plenty of water prior to coming for your lab work. Thanks!   Vaccines You are taking a medication(s) that can suppress your immune system.  The following immunizations are recommended: Flu annually Covid-19  Td/Tdap (tetanus,  diphtheria, pertussis) every 10 years Pneumonia (Prevnar 15 then Pneumovax 23 at least 1 year apart.  Alternatively, can take Prevnar 20 without needing additional dose) Shingrix: 2 doses from 4 weeks to 6 months apart  Please check with your PCP to make sure you are up to date.   If you have signs or symptoms of an infection or start antibiotics: First, call your PCP for workup of your infection. Hold your medication through the infection, until you complete your antibiotics, and until symptoms resolve if you take the following: Injectable medication (Actemra, Benlysta, Cimzia, Cosentyx, Enbrel, Humira, Kevzara, Orencia, Remicade, Simponi, Stelara, Taltz, Tremfya) Methotrexate  Leflunomide  (Arava ) Mycophenolate (Cellcept) Xeljanz, Olumiant, or Rinvoq

## 2024-05-11 ENCOUNTER — Encounter: Payer: Self-pay | Admitting: Family Medicine

## 2024-07-07 ENCOUNTER — Other Ambulatory Visit: Payer: Self-pay | Admitting: Rheumatology

## 2024-07-07 NOTE — Telephone Encounter (Signed)
 Last Fill: 04/16/2024  Labs: 04/22/2024 White cell count is low due to immunosuppression.  CMP is stable.  Will continue to monitor labs every 3 months.   TB Gold: 11/05/2023 TB Gold Negative    Next Visit: 08/19/2024  Last Visit: 05/05/2024  IK:Uzfenmjo arteritis   Current Dose per office note 05/05/2024: Actemra  162 mg subcu every 7 days   Okay to refill Actemra ?

## 2024-07-16 DIAGNOSIS — M7531 Calcific tendinitis of right shoulder: Secondary | ICD-10-CM | POA: Diagnosis not present

## 2024-07-23 DIAGNOSIS — Z79899 Other long term (current) drug therapy: Secondary | ICD-10-CM | POA: Diagnosis not present

## 2024-07-23 DIAGNOSIS — Z1589 Genetic susceptibility to other disease: Secondary | ICD-10-CM | POA: Diagnosis not present

## 2024-07-23 DIAGNOSIS — M810 Age-related osteoporosis without current pathological fracture: Secondary | ICD-10-CM | POA: Diagnosis not present

## 2024-07-23 DIAGNOSIS — Z1501 Genetic susceptibility to malignant neoplasm of breast: Secondary | ICD-10-CM | POA: Diagnosis not present

## 2024-07-23 DIAGNOSIS — M316 Other giant cell arteritis: Secondary | ICD-10-CM | POA: Diagnosis not present

## 2024-07-23 DIAGNOSIS — M818 Other osteoporosis without current pathological fracture: Secondary | ICD-10-CM | POA: Diagnosis not present

## 2024-07-23 DIAGNOSIS — Z7983 Long term (current) use of bisphosphonates: Secondary | ICD-10-CM | POA: Diagnosis not present

## 2024-07-23 DIAGNOSIS — Z170 Estrogen receptor positive status [ER+]: Secondary | ICD-10-CM | POA: Diagnosis not present

## 2024-07-23 DIAGNOSIS — E559 Vitamin D deficiency, unspecified: Secondary | ICD-10-CM | POA: Diagnosis not present

## 2024-07-23 DIAGNOSIS — Z1231 Encounter for screening mammogram for malignant neoplasm of breast: Secondary | ICD-10-CM | POA: Diagnosis not present

## 2024-07-23 DIAGNOSIS — D696 Thrombocytopenia, unspecified: Secondary | ICD-10-CM | POA: Diagnosis not present

## 2024-07-23 DIAGNOSIS — Z1502 Genetic susceptibility to malignant neoplasm of ovary: Secondary | ICD-10-CM | POA: Diagnosis not present

## 2024-07-23 DIAGNOSIS — Z79810 Long term (current) use of selective estrogen receptor modulators (SERMs): Secondary | ICD-10-CM | POA: Diagnosis not present

## 2024-07-23 DIAGNOSIS — C50212 Malignant neoplasm of upper-inner quadrant of left female breast: Secondary | ICD-10-CM | POA: Diagnosis not present

## 2024-07-23 DIAGNOSIS — Z1509 Genetic susceptibility to other malignant neoplasm: Secondary | ICD-10-CM | POA: Diagnosis not present

## 2024-07-29 ENCOUNTER — Other Ambulatory Visit: Payer: Self-pay | Admitting: *Deleted

## 2024-07-29 DIAGNOSIS — E7800 Pure hypercholesterolemia, unspecified: Secondary | ICD-10-CM

## 2024-07-29 DIAGNOSIS — Z79899 Other long term (current) drug therapy: Secondary | ICD-10-CM | POA: Diagnosis not present

## 2024-07-30 ENCOUNTER — Ambulatory Visit: Payer: Self-pay | Admitting: Rheumatology

## 2024-07-30 LAB — CBC WITH DIFFERENTIAL/PLATELET
Absolute Lymphocytes: 1491 {cells}/uL (ref 850–3900)
Absolute Monocytes: 582 {cells}/uL (ref 200–950)
Basophils Absolute: 50 {cells}/uL (ref 0–200)
Basophils Relative: 0.7 %
Eosinophils Absolute: 64 {cells}/uL (ref 15–500)
Eosinophils Relative: 0.9 %
HCT: 41.8 % (ref 35.0–45.0)
Hemoglobin: 13.8 g/dL (ref 11.7–15.5)
MCH: 31.6 pg (ref 27.0–33.0)
MCHC: 33 g/dL (ref 32.0–36.0)
MCV: 95.7 fL (ref 80.0–100.0)
MPV: 11.4 fL (ref 7.5–12.5)
Monocytes Relative: 8.2 %
Neutro Abs: 4913 {cells}/uL (ref 1500–7800)
Neutrophils Relative %: 69.2 %
Platelets: 198 Thousand/uL (ref 140–400)
RBC: 4.37 Million/uL (ref 3.80–5.10)
RDW: 12.9 % (ref 11.0–15.0)
Total Lymphocyte: 21 %
WBC: 7.1 Thousand/uL (ref 3.8–10.8)

## 2024-07-30 LAB — COMPREHENSIVE METABOLIC PANEL WITH GFR
AG Ratio: 2.6 (calc) — ABNORMAL HIGH (ref 1.0–2.5)
ALT: 15 U/L (ref 6–29)
AST: 17 U/L (ref 10–35)
Albumin: 4.6 g/dL (ref 3.6–5.1)
Alkaline phosphatase (APISO): 51 U/L (ref 37–153)
BUN: 16 mg/dL (ref 7–25)
CO2: 29 mmol/L (ref 20–32)
Calcium: 9.2 mg/dL (ref 8.6–10.4)
Chloride: 103 mmol/L (ref 98–110)
Creat: 0.9 mg/dL (ref 0.50–1.05)
Globulin: 1.8 g/dL — ABNORMAL LOW (ref 1.9–3.7)
Glucose, Bld: 79 mg/dL (ref 65–99)
Potassium: 3.6 mmol/L (ref 3.5–5.3)
Sodium: 143 mmol/L (ref 135–146)
Total Bilirubin: 0.6 mg/dL (ref 0.2–1.2)
Total Protein: 6.4 g/dL (ref 6.1–8.1)
eGFR: 70 mL/min/1.73m2 (ref >=60)

## 2024-07-30 LAB — LIPID PANEL
Cholesterol: 214 mg/dL — ABNORMAL HIGH (ref <200)
HDL: 90 mg/dL (ref >=50)
LDL Cholesterol (Calc): 104 mg/dL — ABNORMAL HIGH
Non-HDL Cholesterol (Calc): 124 mg/dL (ref <130)
Total CHOL/HDL Ratio: 2.4 (calc) (ref <5.0)
Triglycerides: 103 mg/dL (ref <150)

## 2024-07-30 NOTE — Progress Notes (Signed)
 LDL is mildly elevated and stable.  CBC and CMP are stable.

## 2024-08-03 ENCOUNTER — Ambulatory Visit (INDEPENDENT_AMBULATORY_CARE_PROVIDER_SITE_OTHER): Admitting: Family Medicine

## 2024-08-03 ENCOUNTER — Encounter: Payer: Self-pay | Admitting: Family Medicine

## 2024-08-03 VITALS — BP 130/66 | HR 57 | Temp 98.1°F | Ht 63.5 in | Wt 157.0 lb

## 2024-08-03 DIAGNOSIS — F411 Generalized anxiety disorder: Secondary | ICD-10-CM | POA: Diagnosis not present

## 2024-08-03 DIAGNOSIS — R0683 Snoring: Secondary | ICD-10-CM

## 2024-08-03 DIAGNOSIS — Z170 Estrogen receptor positive status [ER+]: Secondary | ICD-10-CM

## 2024-08-03 DIAGNOSIS — Z0000 Encounter for general adult medical examination without abnormal findings: Secondary | ICD-10-CM

## 2024-08-03 DIAGNOSIS — E7800 Pure hypercholesterolemia, unspecified: Secondary | ICD-10-CM

## 2024-08-03 DIAGNOSIS — E559 Vitamin D deficiency, unspecified: Secondary | ICD-10-CM

## 2024-08-03 DIAGNOSIS — Z0001 Encounter for general adult medical examination with abnormal findings: Secondary | ICD-10-CM

## 2024-08-03 DIAGNOSIS — I10 Essential (primary) hypertension: Secondary | ICD-10-CM | POA: Diagnosis not present

## 2024-08-03 DIAGNOSIS — M316 Other giant cell arteritis: Secondary | ICD-10-CM

## 2024-08-03 DIAGNOSIS — L918 Other hypertrophic disorders of the skin: Secondary | ICD-10-CM

## 2024-08-03 DIAGNOSIS — M810 Age-related osteoporosis without current pathological fracture: Secondary | ICD-10-CM

## 2024-08-03 DIAGNOSIS — C50212 Malignant neoplasm of upper-inner quadrant of left female breast: Secondary | ICD-10-CM

## 2024-08-03 MED ORDER — ALPRAZOLAM 0.5 MG PO TABS: ORAL_TABLET | ORAL | 4 refills | Status: DC

## 2024-08-03 NOTE — Progress Notes (Signed)
 Cindy Blake is a 68 y.o. female presents to office today for annual physical exam examination.    Discussed the use of AI scribe software for clinical note transcription with the patient, who gave verbal consent to proceed.  History of Present Illness   Cindy Blake is a 68 year old female who presents with suspected sleep apnea and vision concerns.  She experiences snoring and pauses in breathing during sleep, which raises the suspicion of sleep apnea.  She has a history of vision issues, particularly in her right eye, which appears larger and deeper than the left. Since May, there has been no improvement in her vision. Initially, she experienced bursts of color, which have since disappeared. She can detect light and movement when her left eye is closed, but her vision remains otherwise unchanged. Her left eye has 20/30 vision, and she has resumed using reading glasses. She experiences difficulty with night vision, particularly in unfamiliar areas, and avoids driving in high-traffic situations.  Her blood pressure was elevated during the visit, but she reports it was 127/52 at home before the appointment. She experiences anxiety in medical settings, which may contribute to elevated readings.  She is currently taking alprazolam , having reduced her dose to half a milligram daily over the past two and a half months, with occasional need for additional doses during the day.  She has a family history of lung cancer in a maternal first cousin who was a smoker. No other significant family medical changes were reported.  No issues with hearing, swallowing, chest pain, shortness of breath, breast concerns, abdominal pain, nausea, vomiting, rectal bleeding, or abnormal vaginal bleeding. She has a colonoscopy scheduled for September 29, 2024, due to her history of the check two gene.  She mentions having skin tags on her neck that are bothersome and plans to consult a dermatologist for evaluation.  She has a history of wearing a necklace that aggravated the area, leading her to stop wearing it.      Occupation: retired, Marital status: married, Substance use: none Health Maintenance Due  Topic Date Due   COVID-19 Vaccine (7 - Pfizer risk 2024-25 season) 05/04/2024   INFLUENZA VACCINE  07/10/2024   Refills needed today: none  Immunization History  Administered Date(s) Administered   Fluad Quad(high Dose 65+) 10/10/2022   Fluad Trivalent(High Dose 65+) 09/11/2023   Influenza Inj Mdck Quad With Preservative 09/19/2020   Influenza,inj,Quad PF,6+ Mos 10/11/2015, 09/03/2016, 09/26/2017, 10/08/2018, 10/05/2019   Influenza-Unspecified 09/22/2020, 09/27/2021   Moderna Covid-19 Fall Seasonal Vaccine 80yrs & older 12/24/2022, 11/05/2023   PFIZER(Purple Top)SARS-COV-2 Vaccination 02/14/2020, 03/06/2020, 09/30/2020, 09/08/2021   Pneumococcal Conjugate-13 01/09/2017   Pneumococcal Polysaccharide-23 07/07/2021   Tdap 06/05/2018   Zoster Recombinant(Shingrix) 07/07/2021, 10/10/2021   Past Medical History:  Diagnosis Date   Allergy    Cancer (HCC)    Right Breast   Complication of anesthesia    Family history of adverse reaction to anesthesia    nausea   History of colon polyps    Hypertension    IBS (irritable bowel syndrome)    Mitral valve disorder    MVP (mitral valve prolapse)    PONV (postoperative nausea and vomiting)    Social History   Socioeconomic History   Marital status: Married    Spouse name: Not on file   Number of children: Not on file   Years of education: Not on file   Highest education level: Some college, no degree  Occupational History  Not on file  Tobacco Use   Smoking status: Never    Passive exposure: Never   Smokeless tobacco: Never  Vaping Use   Vaping status: Never Used  Substance and Sexual Activity   Alcohol use: Not Currently   Drug use: No   Sexual activity: Not on file  Other Topics Concern   Not on file  Social History Narrative    Not on file   Social Drivers of Health   Financial Resource Strain: Low Risk  (07/30/2024)   Overall Financial Resource Strain (CARDIA)    Difficulty of Paying Living Expenses: Not hard at all  Food Insecurity: No Food Insecurity (07/30/2024)   Hunger Vital Sign    Worried About Running Out of Food in the Last Year: Never true    Ran Out of Food in the Last Year: Never true  Transportation Needs: No Transportation Needs (07/30/2024)   PRAPARE - Administrator, Civil Service (Medical): No    Lack of Transportation (Non-Medical): No  Physical Activity: Insufficiently Active (07/30/2024)   Exercise Vital Sign    Days of Exercise per Week: 3 days    Minutes of Exercise per Session: 40 min  Stress: No Stress Concern Present (07/30/2024)   [employer] of Occupational Health - Occupational Stress Questionnaire    Feeling of Stress: Not at all  Social Connections: Moderately Integrated (07/30/2024)   Social Connection and Isolation Panel    Frequency of Communication with Friends and Family: More than three times a week    Frequency of Social Gatherings with Friends and Family: Three times a week    Attends Religious Services: More than 4 times per year    Active Member of Clubs or Organizations: No    Attends Banker Meetings: Not on file    Marital Status: Married  Intimate Partner Violence: Not At Risk (11/06/2023)   Received from [facility]   HITS    Over the last 12 months how often did your partner physically hurt you?: Never    Over the last 12 months how often did your partner insult you or talk down to you?: Never    Over the last 12 months how often did your partner threaten you with physical harm?: Never    Over the last 12 months how often did your partner scream or curse at you?: Never   Past Surgical History:  Procedure Laterality Date   ABDOMINAL HYSTERECTOMY  age 52   APPENDECTOMY     ARTERY BIOPSY Right 09/21/2022   Procedure: Right  BIOPSY TEMPORAL ARTERY;  Surgeon: Sheree Penne Bruckner, MD;  Location: [facility] OR;  Service: Vascular;  Laterality: Right;   CATARACT EXTRACTION, BILATERAL  04/07/2024   04/21/2024   CHOLECYSTECTOMY     LASIK  2000   SHOULDER SURGERY Right    bone spur   Family History  Problem Relation Age of Onset   Cancer Mother    Hypertension Father    Stroke Father    Heart attack Father    Arthritis Father    Hypertension Sister    Hypertension Sister    Lung cancer Brother    Multiple sclerosis Brother    Healthy Daughter     Current Outpatient Medications:    Acetaminophen  (TYLENOL  PO), Take by mouth as needed., Disp: , Rfl:    ACTEMRA  ACTPEN 162 MG/0.9ML SOAJ, INJECT 1 PEN UNDER THE SKIN EVERY 7 DAYS, Disp: 10.8 mL, Rfl: 0   ALPRAZolam  (XANAX ) 1  MG tablet, TAKE 1/2 TO 1 TABLET qhs AS NEEDED FOR ANXIETY & SLEEP. Initiating taper, Disp: 30 tablet, Rfl: 5   ascorbic acid (VITAMIN C) 500 MG tablet, Take 500 mg by mouth daily., Disp: , Rfl:    aspirin 81 MG tablet, Take 81 mg by mouth daily., Disp: , Rfl:    Calcium Carbonate (CALCIUM 600 PO), Take 600 mg by mouth daily., Disp: , Rfl:    cetirizine (ZYRTEC) 10 MG tablet, Take 10 mg by mouth every morning., Disp: , Rfl:    Cholecalciferol (VITAMIN D ) 2000 UNITS CAPS, Take 2,000 Units by mouth every morning., Disp: , Rfl:    cyanocobalamin (VITAMIN B12) 1000 MCG tablet, Take 1,000 mcg by mouth daily., Disp: , Rfl:    diphenhydrAMINE  (BENADRYL ) 25 MG tablet, Take 25 mg by mouth every 6 (six) hours as needed (Congestion)., Disp: , Rfl:    hydrochlorothiazide  (HYDRODIURIL ) 25 MG tablet, TAKE 1 TABLET (25 MG TOTAL) BY MOUTH DAILY., Disp: 90 tablet, Rfl: 3   losartan  (COZAAR ) 50 MG tablet, TAKE 1 TABLET BY MOUTH EVERY DAY, Disp: 90 tablet, Rfl: 3   potassium chloride  SA (KLOR-CON  M) 20 MEQ tablet, Take 1 tablet (20 mEq total) by mouth daily. To replace 10 MEQ, Disp: 90 tablet, Rfl: 3   tamoxifen (NOLVADEX) 20 MG tablet, Take 20 mg by mouth at  bedtime., Disp: , Rfl:    Zoledronic  Acid (ZOMETA  IV), Inject 4 mg into the vein every 6 (six) months. Last IV:01/2024, Disp: , Rfl:   Allergies  Allergen Reactions   Shellfish-Derived Products Hives   Sulfonamide Derivatives     REACTION: hives, itching, swelling     ROS: Review of Systems Pertinent items noted in HPI and remainder of comprehensive ROS otherwise negative.    Physical exam BP 130/66   Pulse (!) 57   Temp 98.1 F (36.7 C)   Ht 5' 3.5 (1.613 m)   Wt 157 lb (71.2 kg)   SpO2 100%   BMI 27.38 kg/m  General appearance: alert, cooperative, appears stated age, and no distress Head: Normocephalic, without obvious abnormality, atraumatic Eyes: negative findings: lids and lashes normal, conjunctivae and sclerae normal, and corneas clear Ears: normal TM's and external ear canals both ears Nose: Nares normal. Septum midline. Mucosa normal. No drainage or sinus tenderness. Throat: lips, mucosa, and tongue normal; teeth and gums normal Neck: no adenopathy, no carotid bruit, supple, symmetrical, trachea midline, and thyroid  not enlarged, symmetric, no tenderness/mass/nodules Back: symmetric, no curvature. ROM normal. No CVA tenderness. Lungs: clear to auscultation bilaterally Heart: regular rate and rhythm, S1, S2 normal, no murmur, click, rub or gallop Abdomen: soft, non-tender; bowel sounds normal; no masses,  no organomegaly Extremities: extremities normal, atraumatic, no cyanosis or edema Pulses: 2+ and symmetric Skin: skin tags on neck. Several pigmented nevi on trunk Lymph nodes: Cervical, supraclavicular, and axillary nodes normal. 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      08/03/2024   10:42 AM 04/08/2024    2:02 PM 01/10/2024   11:22 AM  Depression screen PHQ 2/9  Decreased Interest 0 0 0  Down, Depressed, Hopeless 0 0 0  PHQ - 2 Score 0 0 0  Altered sleeping 0 0 0  Tired, decreased energy 0 0 0  Change in appetite 0 0 0  Feeling bad or failure about yourself  0  0 0  Trouble concentrating 0 0 0  Moving slowly or fidgety/restless 0 0 0  Suicidal thoughts 0 0 0  PHQ-9 Score 0  0 0  Difficult doing work/chores Not difficult at all Not difficult at all Not difficult at all      08/03/2024   10:42 AM 04/08/2024    2:01 PM 01/10/2024   11:22 AM 09/10/2023    1:09 PM  GAD 7 : Generalized Anxiety Score  Nervous, Anxious, on Edge 0 0 0 0  Control/stop worrying 0 0 0 0  Worry too much - different things 0 0 0 0  Trouble relaxing 0 0 0 0  Restless 0 0 0 0  Easily annoyed or irritable 0 0 0 0  Afraid - awful might happen 0 0 0 0  Total GAD 7 Score 0 0 0 0  Anxiety Difficulty Not difficult at all Not difficult at all Not difficult at all Not difficult at all     Assessment/ Plan: Cindy Blake here for annual physical exam.   Annual physical exam  Essential hypertension  Pure hypercholesterolemia  Temporal arteritis (HCC)  Malignant neoplasm of upper-inner quadrant of left breast in female, estrogen receptor positive (HCC)  Age-related osteoporosis without current pathological fracture - Plan: VITAMIN D  25 Hydroxy (Vit-D Deficiency, Fractures), CANCELED: VITAMIN D  25 Hydroxy (Vit-D Deficiency, Fractures)  Vitamin D  deficiency - Plan: VITAMIN D  25 Hydroxy (Vit-D Deficiency, Fractures), CANCELED: VITAMIN D  25 Hydroxy (Vit-D Deficiency, Fractures)  Generalized anxiety disorder - Plan: ToxASSURE Select 13 (MW), Urine, ALPRAZolam  (XANAX ) 0.5 MG tablet  Snoring - Plan: Ambulatory referral to Sleep Studies  Acquired skin tag - Plan: Ambulatory referral to Dermatology  Assessment and Plan    Suspected obstructive sleep apnea Snoring and breathing pauses suggest obstructive sleep apnea. Discussed importance of diagnosis and treatment to prevent complications like atrial fibrillation. Explained sleep study options; she prefers in-clinic study due to nearing out-of-pocket maximum. - Order comprehensive in-clinic sleep study with Dr. Lurene and Dr.  Elza at [facility].  Vision loss, right eye and impaired night vision due to temporal arteritis Persistent right eye vision loss since May with impaired night vision causing anxiety while driving. Left eye vision is 20/30, uses reading glasses for near vision.  Hypertension Elevated blood pressure during visit attributed to stress; home readings normal at 127/52 mmHg.  Repeat BP normal here in office.  Overweight Weight stable, desires weight loss. Discussed Zepbound for weight loss if sleep apnea confirmed. Cautioned about vision loss with medications like Ozempic/Wegovy; further investigation into Zepbound needed. Weight loss could improve sleep apnea.  Generalized anxiety disorder Taking alprazolam , reduced to half a tablet daily for two and a half months. Occasionally needs additional doses. - Prescribe alprazolam  0.5 mg tablets, instruct to take 0.5 to 1 tablet as needed. - Encourage reduction to half of a half tablet if possible. - UDS/ Csc today. - The Narcotic Database has been reviewed.  There were no red flags.     Skin tags, neck Skin tags on neck causing discomfort with necklaces. - Refer to Dr. Shona for dermatology evaluation and management of skin tags.   Osteoporosis associated with Vit D deficiency Check vit d with next lab draw for specialist.     Counseled on healthy lifestyle choices, including diet (rich in fruits, vegetables and lean meats and low in salt and simple carbohydrates) and exercise (at least 30 minutes of moderate physical activity daily).  Patient to follow up 4-50m for xanax .  Cindy Blake M. Jolinda, DO

## 2024-08-05 NOTE — Progress Notes (Signed)
 Office Visit Note  Patient: Cindy Blake             Date of Birth: Apr 10, 1956           MRN: [id number]             PCP: Jolinda Norene HERO, DO Referring: Jolinda Norene HERO, DO Visit Date: 08/19/2024 Occupation: @GUAROCC @  Subjective:  Medication management  History of Present Illness: Cindy Blake is a 68 y.o. female with temporal arteritis and osteoporosis.  She returns today after her last visit in May 2025.  She has not noticed any change in her vision.  She continues to take Actemra  162 mg subcu every 7 days without any interruption.  She denies any joint pain or joint swelling.  There is no muscular weakness or tenderness.  She has not had any headaches.  As she was seen by the oncologist for follow-up on the breast cancer.  She was given her next infusion of Zometa  in August 2025.    Activities of Daily Living:  Patient reports morning stiffness for a few minutes.   Patient Denies nocturnal pain.  Difficulty dressing/grooming: Denies Difficulty climbing stairs: Denies Difficulty getting out of chair: Denies Difficulty using hands for taps, buttons, cutlery, and/or writing: Denies  Review of Systems  Constitutional:  Negative for fatigue.  HENT:  Negative for mouth sores and mouth dryness.   Eyes:  Negative for dryness.  Respiratory:  Negative for shortness of breath.   Cardiovascular:  Negative for chest pain and palpitations.  Gastrointestinal:  Negative for blood in stool, constipation and diarrhea.  Endocrine: Negative for increased urination.  Genitourinary:  Negative for involuntary urination.  Musculoskeletal:  Positive for morning stiffness. Negative for joint pain, gait problem, joint pain, joint swelling, myalgias, muscle weakness, muscle tenderness and myalgias.  Skin:  Negative for color change, rash, hair loss and sensitivity to sunlight.  Allergic/Immunologic: Negative for susceptible to infections.  Neurological:  Negative for dizziness and  headaches.  Hematological:  Negative for swollen glands.  Psychiatric/Behavioral:  Negative for depressed mood and sleep disturbance. The patient is not nervous/anxious.     PMFS History:  Patient Active Problem List   Diagnosis Date Noted   Temporal arteritis (HCC) 01/28/2024   High risk medication use 01/28/2024   Osteoporosis 12/08/2019   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) 02/01/2015   Generalized anxiety disorder 11/10/2013   Allergic rhinitis 11/10/2013   Hyperlipemia 07/06/2013   Essential hypertension 07/06/2013   Vitamin D  deficiency 07/06/2013    Past Medical History:  Diagnosis Date   Allergy    Cancer (HCC)    Right Breast   Complication of anesthesia    Family history of adverse reaction to anesthesia    nausea   History of colon polyps    Hypertension    IBS (irritable bowel syndrome)    Mitral valve disorder    MVP (mitral valve prolapse)    PONV (postoperative nausea and vomiting)     Family History  Problem Relation Age of Onset   Cancer Mother    Hypertension Father    Stroke Father    Heart attack Father    Arthritis Father    Hypertension Sister    Hypertension Sister    Lung cancer Brother    Multiple sclerosis Brother    Healthy Daughter    Past Surgical History:  Procedure Laterality Date   ABDOMINAL HYSTERECTOMY  age 89   APPENDECTOMY  ARTERY BIOPSY Right 09/21/2022   Procedure: Right BIOPSY TEMPORAL ARTERY;  Surgeon: Sheree Penne Bruckner, MD;  Location: [facility] OR;  Service: Vascular;  Laterality: Right;   CATARACT EXTRACTION, BILATERAL  04/07/2024   04/21/2024   CHOLECYSTECTOMY     LASIK  2000   SHOULDER SURGERY Right    bone spur   Social History   Social History Narrative   Not on file   Immunization History  Administered Date(s) Administered   Fluad Quad(high Dose 65+) 10/10/2022   Fluad Trivalent(High Dose 65+) 09/11/2023   Influenza Inj Mdck Quad With Preservative 09/19/2020   Influenza,inj,Quad  PF,6+ Mos 10/11/2015, 09/03/2016, 09/26/2017, 10/08/2018, 10/05/2019   Influenza-Unspecified 09/22/2020, 09/27/2021   Moderna Covid-19 Fall Seasonal Vaccine 33yrs & older 12/24/2022, 11/05/2023   PFIZER(Purple Top)SARS-COV-2 Vaccination 02/14/2020, 03/06/2020, 09/30/2020, 09/08/2021   Pneumococcal Conjugate-13 01/09/2017   Pneumococcal Polysaccharide-23 07/07/2021   Tdap 06/05/2018   Zoster Recombinant(Shingrix) 07/07/2021, 10/10/2021     Objective: Vital Signs: BP 133/77   Pulse (!) 58   Temp 97.7 F (36.5 C)   Resp 14   Ht 5' 3 (1.6 m)   Wt 157 lb 6.4 oz (71.4 kg)   BMI 27.88 kg/m    Physical Exam Vitals and nursing note reviewed.  Constitutional:      Appearance: She is well-developed.  HENT:     Head: Normocephalic and atraumatic.  Eyes:     Conjunctiva/sclera: Conjunctivae normal.  Cardiovascular:     Rate and Rhythm: Normal rate and regular rhythm.     Heart sounds: Normal heart sounds.  Pulmonary:     Effort: Pulmonary effort is normal.     Breath sounds: Normal breath sounds.  Abdominal:     General: Bowel sounds are normal.     Palpations: Abdomen is soft.  Musculoskeletal:     Cervical back: Normal range of motion.  Lymphadenopathy:     Cervical: No cervical adenopathy.  Skin:    General: Skin is warm and dry.     Capillary Refill: Capillary refill takes less than 2 seconds.  Neurological:     Mental Status: She is alert and oriented to person, place, and time.     Comments: No temporal artery tenderness was noted.  No proximal muscle weakness was noted.  Psychiatric:        Behavior: Behavior normal.      Musculoskeletal Exam: Cervical, thoracic and lumbar spine Juengel range of motion.  Shoulders, elbows, wrist joints, MCPs PIPs and DIPs with good range of motion with no synovitis.  Hip joints, knee joints, ankles, MTPs and PIPs with good range of motion with no synovitis.  She had no muscular weakness or tenderness.  CDAI Exam: CDAI Score:  -- Patient Global: --; Provider Global: -- Swollen: --; Tender: -- Joint Exam 08/19/2024   No joint exam has been documented for this visit   There is currently no information documented on the homunculus. Go to the Rheumatology activity and complete the homunculus joint exam.  Investigation: No additional findings.  Imaging: No results found.  Recent Labs: Lab Results  Component Value Date   WBC 7.1 07/29/2024   HGB 13.8 07/29/2024   PLT 198 07/29/2024   NA 143 07/29/2024   K 3.6 07/29/2024   CL 103 07/29/2024   CO2 29 07/29/2024   GLUCOSE 79 07/29/2024   BUN 16 07/29/2024   CREATININE 0.90 07/29/2024   BILITOT 0.6 07/29/2024   ALKPHOS 54 11/05/2023   AST 17 07/29/2024   ALT 15  07/29/2024   PROT 6.4 07/29/2024   ALBUMIN 4.2 11/05/2023   CALCIUM 9.2 07/29/2024   GFRAA 99 07/06/2020   QFTBGOLDPLUS Negative 11/05/2023    Speciality Comments: Ophthalmologist-[facility] Samantha Dunnington Actemra  10/04/22 Zometa  02/25, 08/25  Procedures:  No procedures performed Allergies: Shellfish-derived products and Sulfonamide derivatives   Assessment / Plan:     Visit Diagnoses: Temporal arteritis (HCC) - Biopsy-proven with right eye vision loss.  Dxd October 2023. Patient has been off prednisone  since July 20, 2023.  She had no recurrence of headaches.  She had no temporal artery tenderness.  She had no interruption in Actemra  therapy which she has been taking every 7 days.  Amaurosis fugax of right eye - Followed by Dr. Delories at [facility].She had bilateral cataract surgery  and recovered well.patient denies any change in her vision.  High risk medication use - Actemra  162 mg subcu every 7 days. -August 06, 2024 CBC and CMP were normal.  TB Gold was negative on November 05, 2023.  She was advised to get repeat labs in November along with TB Gold.  Information reimmunization was placed in the AVS.  She plan: QuantiFERON-TB Gold Plus  Age-related osteoporosis  without current pathological fracture - Zometa  IV x 2 by the oncologist.IV 01/2023.11/25/2023 bone mineral density as measured at the left radius 33% region of interest is 0.613 g/cm2. T-score: -3.0.  She is on Zometa  by oncology.  She had her first infusion in February 2025 and second infusion in August 2025.  Vitamin D  deficiency-followed by oncology.  Primary hypertension-blood pressure was normal at 133/77.  Malignant neoplasm of upper-inner quadrant of left breast in female, estrogen receptor positive (HCC) - 2016 lumpectomy, LN resection, RTX and tamoxifen, followed by [facility] oncology.  Generalized anxiety disorder-she takes Xanax  as needed.  Pure hypercholesterolemia-July 29, 2024 lipid panel LDL 104, total cholesterol 214.  Orders: Orders Placed This Encounter  Procedures   QuantiFERON-TB Gold Plus   No orders of the defined types were placed in this encounter.    Follow-Up Instructions: Return in about 3 months (around 11/18/2024) for Temporal arteritis, Osteoporosis.   Maya Nash, MD  Note - This record has been created using Animal nutritionist.  Chart creation errors have been sought, but may not always  have been located. Such creation errors do not reflect on  the standard of medical care.

## 2024-08-07 LAB — TOXASSURE SELECT 13 (MW), URINE

## 2024-08-11 ENCOUNTER — Ambulatory Visit: Payer: Self-pay | Admitting: Family Medicine

## 2024-08-11 ENCOUNTER — Telehealth: Payer: Self-pay

## 2024-08-11 NOTE — Telephone Encounter (Signed)
 Submitted a Prior Authorization request to [facility] for ACTEMRA  SQ via fax. Will update once we receive a response.  Case Number: [id number] Fax: [phone] Phone: [phone]

## 2024-08-19 ENCOUNTER — Ambulatory Visit: Attending: Rheumatology | Admitting: Rheumatology

## 2024-08-19 ENCOUNTER — Encounter: Payer: Self-pay | Admitting: Rheumatology

## 2024-08-19 VITALS — BP 133/77 | HR 58 | Temp 97.7°F | Resp 14 | Ht 63.0 in | Wt 157.4 lb

## 2024-08-19 DIAGNOSIS — Z170 Estrogen receptor positive status [ER+]: Secondary | ICD-10-CM

## 2024-08-19 DIAGNOSIS — G453 Amaurosis fugax: Secondary | ICD-10-CM

## 2024-08-19 DIAGNOSIS — F411 Generalized anxiety disorder: Secondary | ICD-10-CM

## 2024-08-19 DIAGNOSIS — M810 Age-related osteoporosis without current pathological fracture: Secondary | ICD-10-CM

## 2024-08-19 DIAGNOSIS — I10 Essential (primary) hypertension: Secondary | ICD-10-CM

## 2024-08-19 DIAGNOSIS — M316 Other giant cell arteritis: Secondary | ICD-10-CM

## 2024-08-19 DIAGNOSIS — Z79899 Other long term (current) drug therapy: Secondary | ICD-10-CM

## 2024-08-19 DIAGNOSIS — E7800 Pure hypercholesterolemia, unspecified: Secondary | ICD-10-CM

## 2024-08-19 DIAGNOSIS — E559 Vitamin D deficiency, unspecified: Secondary | ICD-10-CM

## 2024-08-19 DIAGNOSIS — C50212 Malignant neoplasm of upper-inner quadrant of left female breast: Secondary | ICD-10-CM

## 2024-08-19 NOTE — Patient Instructions (Signed)
 Standing Labs We placed an order today for your standing lab work.   Please have your standing labs drawn in November and every 3 months  Please have your labs drawn 2 weeks prior to your appointment so that the provider can discuss your lab results at your appointment, if possible.  Please note that you may see your imaging and lab results in MyChart before we have reviewed them. We will contact you once all results are reviewed. Please allow our office up to 72 hours to thoroughly review all of the results before contacting the office for clarification of your results.  WALK-IN LAB HOURS  Monday through Thursday from 8:00 am -12:30 pm and 1:00 pm-4:30 pm and Friday from 8:00 am-12:00 pm.  Patients with office visits requiring labs will be seen before walk-in labs.  You may encounter longer than normal wait times. Please allow additional time. Wait times may be shorter on  Monday and Thursday afternoons.  We do not book appointments for walk-in labs. We appreciate your patience and understanding with our staff.   Labs are drawn by [facility]. Please bring your co-pay at the time of your lab draw.  You may receive a bill from [facility] for your lab work.  Please note if you are on Hydroxychloroquine and and an order has been placed for a Hydroxychloroquine level,  you will need to have it drawn 4 hours or more after your last dose.  If you wish to have your labs drawn at another location, please call the office 24 hours in advance so we can fax the orders.  The office is located at [address], [address], [place], [place] [address]   If you have any questions regarding directions or hours of operation,  please call [phone].   As a reminder, please drink plenty of water prior to coming for your lab work. Thanks!   Vaccines You are taking a medication(s) that can suppress your immune system.  The following immunizations are recommended: Flu annually Covid-19  Td/Tdap (tetanus,  diphtheria, pertussis) every 10 years Pneumonia (Prevnar 15 then Pneumovax 23  at least 1 year apart.  Alternatively, can take Prevnar 20 without needing additional dose) Shingrix: 2 doses from 4 weeks to 6 months apart  Please check with your PCP to make sure you are up to date.   If you have signs or symptoms of an infection or start antibiotics: First, call your PCP for workup of your infection. Hold your medication through the infection, until you complete your antibiotics, and until symptoms resolve if you take the following: Injectable medication (Actemra, Benlysta, Cimzia, Cosentyx, Enbrel , Humira, Kevzara, Orencia, Remicade, Simponi, Stelara, Taltz, Tremfya) Methotrexate Leflunomide (Arava) Mycophenolate (Cellcept) Earma, Olumiant, or Rinvoq

## 2024-08-19 NOTE — Telephone Encounter (Signed)
 Received notification from [facility] regarding a prior authorization for ACTEMRA  SQ. Authorization has been APPROVED from 08/12/2024 to 08/12/2025. Approval letter sent to scan center.  Authorization # [id number]   Sherry Pennant, PharmD, MPH, BCPS, CPP Clinical Pharmacist [facility])

## 2024-08-28 ENCOUNTER — Telehealth: Payer: Self-pay | Admitting: Rheumatology

## 2024-08-28 NOTE — Telephone Encounter (Signed)
 Spoke with patient and advised we have not received any paperwork from [employer]. Patient states she will call them again and have them fax it.

## 2024-08-28 NOTE — Telephone Encounter (Signed)
 Patient is calling to see if [employer] has sent over her paper work for work. Patient is requesting a call back.

## 2024-08-31 DIAGNOSIS — C44519 Basal cell carcinoma of skin of other part of trunk: Secondary | ICD-10-CM | POA: Diagnosis not present

## 2024-08-31 DIAGNOSIS — D225 Melanocytic nevi of trunk: Secondary | ICD-10-CM | POA: Diagnosis not present

## 2024-08-31 DIAGNOSIS — Z1283 Encounter for screening for malignant neoplasm of skin: Secondary | ICD-10-CM | POA: Diagnosis not present

## 2024-08-31 DIAGNOSIS — D2271 Melanocytic nevi of right lower limb, including hip: Secondary | ICD-10-CM | POA: Diagnosis not present

## 2024-08-31 DIAGNOSIS — D485 Neoplasm of uncertain behavior of skin: Secondary | ICD-10-CM | POA: Diagnosis not present

## 2024-08-31 DIAGNOSIS — L820 Inflamed seborrheic keratosis: Secondary | ICD-10-CM | POA: Diagnosis not present

## 2024-08-31 DIAGNOSIS — D2261 Melanocytic nevi of right upper limb, including shoulder: Secondary | ICD-10-CM | POA: Diagnosis not present

## 2024-09-01 NOTE — Telephone Encounter (Signed)
 Paperwork received to request accommodations for patient to continue to work from home due to vision impairment (patient cannot drive long distance) and immunosuppression.   Paperwork completed, Dr. Dolphus reviewed/signed. Paperwork has been faxed. I called patient and advised. Patient requested a copy to be mailed to her home address as well. A copy has been mailed to the patient and a copy sent to the scan center.

## 2024-09-08 ENCOUNTER — Ambulatory Visit (INDEPENDENT_AMBULATORY_CARE_PROVIDER_SITE_OTHER): Admitting: Neurology

## 2024-09-08 ENCOUNTER — Encounter: Payer: Self-pay | Admitting: Neurology

## 2024-09-08 VITALS — BP 142/55 | HR 57 | Ht 63.0 in | Wt 157.0 lb

## 2024-09-08 DIAGNOSIS — G4700 Insomnia, unspecified: Secondary | ICD-10-CM

## 2024-09-08 DIAGNOSIS — R0681 Apnea, not elsewhere classified: Secondary | ICD-10-CM

## 2024-09-08 DIAGNOSIS — Z9189 Other specified personal risk factors, not elsewhere classified: Secondary | ICD-10-CM | POA: Diagnosis not present

## 2024-09-08 DIAGNOSIS — R519 Headache, unspecified: Secondary | ICD-10-CM

## 2024-09-08 DIAGNOSIS — M316 Other giant cell arteritis: Secondary | ICD-10-CM

## 2024-09-08 DIAGNOSIS — R0683 Snoring: Secondary | ICD-10-CM

## 2024-09-08 DIAGNOSIS — R351 Nocturia: Secondary | ICD-10-CM

## 2024-09-08 DIAGNOSIS — E663 Overweight: Secondary | ICD-10-CM

## 2024-09-08 NOTE — Patient Instructions (Signed)

## 2024-09-08 NOTE — Progress Notes (Signed)
 Subjective:    Patient ID: Cindy Blake is a 68 y.o. female.  HPI    True Mar, MD, PhD [facility] [address], [address] [address] [place], [place] [address]  Dear Dr. Jolinda,   I saw your patient, Cindy Blake, upon your kind request in my sleep clinic today for initial consultation of her sleep disorder, in particular, concern for underlying obstructive sleep apnea.  The patient is unaccompanied today.  As you know, Cindy Blake is a 68 year old female with an underlying medical history of breast cancer, status post lumpectomy and XRT, on tamoxifen for 10 years, giant cell arteritis, on Actemra  (followed by rheumatology), right eye impaired vision, allergies, hypertension, irritable bowel syndrome, mitral valve prolapse, and overweight state, who reports snoring and excessive daytime somnolence, as well as witnessed apneas per husband's observation.  Her Epworth sleepiness score is 3 out of 24, fatigue severity score is 22 out of 63.  I reviewed your office note from 08/03/2024.  She has chronic difficulty initiating and maintaining sleep.  She has been on Xanax  at bedtime, currently 0.5 mg each night.  She is tapering down on Xanax .  She is not aware of any family history of sleep apnea.  Bedtime is generally around 10:30 PM and rise time between 530 and 6 or 7.  She has nocturia about once or twice per average night and now has rare morning headaches.  A couple of years ago she was having more headaches until she was diagnosed with giant cell arteritis.  She lives with her husband, they have 1 grown daughter.  She works full-time as a Nurse, children's, she works from home and has done so for the past 5-1/2 years.  She has tried an occlusive guard for TMJ issues to Dr. Melba in dentistry but could not tolerate it.  She has 1 dog in the household.  They have a TV in the bedroom and generally if they watch it they turn it off around 10:30 PM.  She does not  drink any alcohol.  She limits her caffeine to 1 cup of coffee in the morning.  She is a non-smoker.   Her Past Medical History Is Significant For: Past Medical History:  Diagnosis Date   Allergy    Cancer (HCC)    Right Breast   Complication of anesthesia    Family history of adverse reaction to anesthesia    nausea   History of colon polyps    Hypertension    IBS (irritable bowel syndrome)    Mitral valve disorder    MVP (mitral valve prolapse)    PONV (postoperative nausea and vomiting)     Her Past Surgical History Is Significant For: Past Surgical History:  Procedure Laterality Date   ABDOMINAL HYSTERECTOMY  age 31   APPENDECTOMY     ARTERY BIOPSY Right 09/21/2022   Procedure: Right BIOPSY TEMPORAL ARTERY;  Surgeon: Sheree Penne Bruckner, MD;  Location: [facility] OR;  Service: Vascular;  Laterality: Right;   CATARACT EXTRACTION, BILATERAL  04/07/2024   04/21/2024   CHOLECYSTECTOMY     LASIK  2000   SHOULDER SURGERY Right    bone spur    Her Family History Is Significant For: Family History  Problem Relation Age of Onset   Cancer Mother    Hypertension Father    Stroke Father    Heart attack Father    Arthritis Father    Hypertension Sister    Hypertension Sister  Lung cancer Brother    Multiple sclerosis Brother    Healthy Daughter    Sleep apnea Neg Hx     Her Social History Is Significant For: Social History   Socioeconomic History   Marital status: Married    Spouse name: Not on file   Number of children: Not on file   Years of education: Not on file   Highest education level: Some college, no degree  Occupational History   Not on file  Tobacco Use   Smoking status: Never    Passive exposure: Never   Smokeless tobacco: Never  Vaping Use   Vaping status: Never Used  Substance and Sexual Activity   Alcohol use: Not Currently   Drug use: No   Sexual activity: Not on file  Other Topics Concern   Not on file  Social History Narrative   Pt  lives with husband    Pt works    Social Drivers of Corporate investment banker Strain: Low Risk  (07/30/2024)   Overall Financial Resource Strain (CARDIA)    Difficulty of Paying Living Expenses: Not hard at all  Food Insecurity: No Food Insecurity (07/30/2024)   Hunger Vital Sign    Worried About Running Out of Food in the Last Year: Never true    Ran Out of Food in the Last Year: Never true  Transportation Needs: No Transportation Needs (07/30/2024)   PRAPARE - Administrator, Civil Service (Medical): No    Lack of Transportation (Non-Medical): No  Physical Activity: Insufficiently Active (07/30/2024)   Exercise Vital Sign    Days of Exercise per Week: 3 days    Minutes of Exercise per Session: 40 min  Stress: No Stress Concern Present (07/30/2024)   [employer] of Occupational Health - Occupational Stress Questionnaire    Feeling of Stress: Not at all  Social Connections: Moderately Integrated (07/30/2024)   Social Connection and Isolation Panel    Frequency of Communication with Friends and Family: More than three times a week    Frequency of Social Gatherings with Friends and Family: Three times a week    Attends Religious Services: More than 4 times per year    Active Member of Clubs or Organizations: No    Attends Engineer, structural: Not on file    Marital Status: Married    Her Allergies Are:  Allergies  Allergen Reactions   Shellfish-Derived Products Hives   Sulfonamide Derivatives     REACTION: hives, itching, swelling  :   Her Current Medications Are:  Outpatient Encounter Medications as of 09/08/2024  Medication Sig   Acetaminophen  (TYLENOL  PO) Take by mouth as needed.   ACTEMRA  ACTPEN 162 MG/0.9ML SOAJ INJECT 1 PEN UNDER THE SKIN EVERY 7 DAYS (Patient taking differently: once a week.)   ALPRAZolam  (XANAX ) 0.5 MG tablet TAKE 1/2 TO 1 TABLET qhs AS NEEDED FOR ANXIETY & SLEEP. *dose change (Patient taking differently: at bedtime. TAKE 1/2  TO 1 TABLET qhs AS NEEDED FOR ANXIETY & SLEEP. *dose change)   ascorbic acid (VITAMIN C) 500 MG tablet Take 500 mg by mouth daily.   aspirin 81 MG tablet Take 81 mg by mouth daily.   Calcium Carbonate (CALCIUM 600 PO) Take 600 mg by mouth daily.   cetirizine (ZYRTEC) 10 MG tablet Take 10 mg by mouth every morning.   Cholecalciferol (VITAMIN D ) 2000 UNITS CAPS Take 2,000 Units by mouth every morning.   cyanocobalamin (VITAMIN B12) 1000 MCG tablet Take  1,000 mcg by mouth daily.   diphenhydrAMINE  (BENADRYL ) 25 MG tablet Take 25 mg by mouth every 6 (six) hours as needed (Congestion).   hydrochlorothiazide  (HYDRODIURIL ) 25 MG tablet TAKE 1 TABLET (25 MG TOTAL) BY MOUTH DAILY.   losartan  (COZAAR ) 50 MG tablet TAKE 1 TABLET BY MOUTH EVERY DAY   potassium chloride  SA (KLOR-CON  M) 20 MEQ tablet Take 1 tablet (20 mEq total) by mouth daily. To replace 10 MEQ   tamoxifen (NOLVADEX) 20 MG tablet Take 20 mg by mouth at bedtime.   Zoledronic  Acid (ZOMETA  IV) Inject 4 mg into the vein every 6 (six) months. Last IV:01/2024   No facility-administered encounter medications on file as of 09/08/2024.  :   Review of Systems:  Out of a complete 14 point review of systems, all are reviewed and negative with the exception of these symptoms as listed below:   Review of Systems  Neurological:        Pt here for sleep consult Pt snores,headaches,fatigue,hypertension. Pt denies sleep study,cpap machine     ESS:3 FSS:22      Objective:  Neurological Exam  Physical Exam Physical Examination:   Vitals:   09/08/24 1503  BP: (!) 142/55  Pulse: (!) 57   General Examination: The patient is a very pleasant 68 y.o. female in no acute distress. She appears well-developed and well-nourished and well groomed.   HEENT: Normocephalic, atraumatic, pupils are equal, round and reactive to light, extraocular tracking is good without limitation to gaze excursion or nystagmus noted.  Status post bilateral cataract  surgeries.  Hearing is grossly intact. Face is symmetric with normal facial animation. Speech is clear with no dysarthria noted. There is no hypophonia. There is no lip, neck/head, jaw or voice tremor. Neck is supple with full range of passive and active motion. There are no carotid bruits on auscultation. Oropharynx exam reveals: mild mouth dryness, adequate dental hygiene and mild airway crowding, due to small airway entry, redundant soft palate, Mallampati class IV, tonsils are absent but tip of uvula not fully visualized.  Tongue protrudes centrally.  Neck circumference 13-5/8 inches.  She has a mild to moderate overbite.  Chest: Clear to auscultation without wheezing, rhonchi or crackles noted.  Heart: S1+S2+0, regular and normal without murmurs, rubs or gallops noted.   Abdomen: Soft, non-tender and non-distended.  Extremities: There is no pitting edema in the distal lower extremities bilaterally.   Skin: Warm and dry without trophic changes noted.   Musculoskeletal: exam reveals no obvious joint deformities.   Neurologically:  Mental status: The patient is awake, alert and oriented in all 4 spheres. Her immediate and remote memory, attention, language skills and fund of knowledge are appropriate. There is no evidence of aphasia, agnosia, apraxia or anomia. Speech is clear with normal prosody and enunciation. Thought process is linear. Mood is normal and affect is normal.  Cranial nerves II - XII are as described above under HEENT exam.  Motor exam: Normal bulk, strength and tone is noted. There is no obvious action or resting tremor.  Fine motor skills and coordination: grossly intact.  Cerebellar testing: No dysmetria or intention tremor. There is no truncal or gait ataxia.  Sensory exam: intact to light touch in the upper and lower extremities.  Gait, station and balance: She stands easily. No veering to one side is noted. No leaning to one side is noted. Posture is age-appropriate and  stance is narrow based. Gait shows normal stride length and normal pace. No problems  turning are noted.   Assessment and Pla:n:   In summary, Cindy Blake is a very pleasant 68 y.o.-year old female with an underlying medical history of breast cancer, status post lumpectomy and XRT, on tamoxifen for 10 years, giant cell arteritis, on Actemra  (followed by rheumatology), right eye impaired vision, allergies, hypertension, irritable bowel syndrome, mitral valve prolapse, and overweight state, whose history and physical exam are concerning for sleep disordered breathing, particularly obstructive sleep apnea (OSA). A laboratory attended sleep study is typically considered gold standard for evaluation of sleep disordered breathing.   I had a long chat with the patient about my findings and the diagnosis of sleep apnea, particularly OSA, its prognosis and treatment options. We talked about medical/conservative treatments, surgical interventions and non-pharmacological approaches for symptom control. I explained, in particular, the risks and ramifications of untreated moderate to severe OSA, especially with respect to developing cardiovascular disease down the road, including congestive heart failure (CHF), difficult to treat hypertension, cardiac arrhythmias (particularly A-fib), neurovascular complications including TIA, stroke and dementia. Even type 2 diabetes has, in part, been linked to untreated OSA. Symptoms of untreated OSA may include (but may not be limited to) daytime sleepiness, nocturia (i.e. frequent nighttime urination), memory problems, mood irritability and suboptimally controlled or worsening mood disorder such as depression and/or anxiety, lack of energy, lack of motivation, physical discomfort, as well as recurrent headaches, especially morning or nocturnal headaches. We talked about the importance of maintaining a healthy lifestyle and striving for healthy weight. In addition, we talked about  the importance of striving for and maintaining good sleep hygiene. I recommended a sleep study at this time. I outlined the differences between a laboratory attended sleep study which is considered more comprehensive and accurate over the option of a home sleep test (HST); the latter may lead to underestimation of sleep disordered breathing in some instances and does not help with diagnosing upper airway resistance syndrome and is not accurate enough to diagnose primary central sleep apnea typically. I outlined possible surgical and non-surgical treatment options of OSA, including the use of a positive airway pressure (PAP) device (i.e. CPAP, AutoPAP/APAP or BiPAP in certain circumstances), a custom-made dental device (aka oral appliance, which would require a referral to a specialist dentist or orthodontist typically, and is generally speaking not considered for patients with full dentures or edentulous state), upper airway surgical options, such as traditional UPPP (which is not considered a first-line treatment) or the Inspire device (hypoglossal nerve stimulator, which would involve a referral for consultation with an ENT surgeon, after careful selection, following inclusion criteria - also not first-line treatment). I explained the PAP treatment option to the patient in detail, as this is generally considered first-line treatment.  The patient indicated that she would be willing to try PAP therapy, if the need arises. I explained the importance of being compliant with PAP treatment, not only for insurance purposes but primarily to improve patient's symptoms symptoms, and for the patient's long term health benefit, including to reduce Her cardiovascular risks longer-term.    We will pick up our discussion about the next steps and treatment options after testing.  We will keep her posted as to the test results by phone call and/or MyChart messaging where possible.  We will plan to follow-up in sleep clinic  accordingly as well.  I answered all her questions today and the patient was in agreement.   I encouraged her to call with any interim questions, concerns, problems or updates or  email us  through [employer].  Generally speaking, sleep test authorizations may take up to 2 weeks, sometimes less, sometimes longer, the patient is encouraged to get in touch with us  if they do not hear back from the sleep lab staff directly within the next 2 weeks.  Thank you very much for allowing me to participate in the care of this nice patient. If I can be of any further assistance to you please do not hesitate to call me at [phone].  Sincerely,   True Mar, MD, PhD

## 2024-09-09 HISTORY — PX: COLONOSCOPY: SHX174

## 2024-09-29 DIAGNOSIS — Z1211 Encounter for screening for malignant neoplasm of colon: Secondary | ICD-10-CM | POA: Diagnosis not present

## 2024-09-29 DIAGNOSIS — Z800 Family history of malignant neoplasm of digestive organs: Secondary | ICD-10-CM | POA: Diagnosis not present

## 2024-09-29 LAB — HM COLONOSCOPY

## 2024-09-30 ENCOUNTER — Other Ambulatory Visit: Payer: Self-pay | Admitting: Rheumatology

## 2024-09-30 NOTE — Telephone Encounter (Signed)
 Last Fill: 07/07/2024  Labs: 07/29/2024 LDL is mildly elevated and stable. CBC and CMP are stable.   TB Gold: 11/05/2023 Neg    Next Visit: 11/11/2024  Last Visit: 08/19/2024  IK:Uzfenmjo arteritis   Current Dose per office note 08/19/2024: Actemra  162 mg subcu every 7 days   Okay to refill Actemra ?

## 2024-10-12 DIAGNOSIS — Z85828 Personal history of other malignant neoplasm of skin: Secondary | ICD-10-CM | POA: Diagnosis not present

## 2024-10-12 DIAGNOSIS — L820 Inflamed seborrheic keratosis: Secondary | ICD-10-CM | POA: Diagnosis not present

## 2024-10-12 DIAGNOSIS — Z08 Encounter for follow-up examination after completed treatment for malignant neoplasm: Secondary | ICD-10-CM | POA: Diagnosis not present

## 2024-10-12 DIAGNOSIS — B0089 Other herpesviral infection: Secondary | ICD-10-CM | POA: Diagnosis not present

## 2024-10-21 ENCOUNTER — Ambulatory Visit: Admitting: Neurology

## 2024-10-21 DIAGNOSIS — R0681 Apnea, not elsewhere classified: Secondary | ICD-10-CM

## 2024-10-21 DIAGNOSIS — E663 Overweight: Secondary | ICD-10-CM

## 2024-10-21 DIAGNOSIS — R0683 Snoring: Secondary | ICD-10-CM

## 2024-10-21 DIAGNOSIS — M316 Other giant cell arteritis: Secondary | ICD-10-CM

## 2024-10-21 DIAGNOSIS — G4733 Obstructive sleep apnea (adult) (pediatric): Secondary | ICD-10-CM

## 2024-10-21 DIAGNOSIS — R351 Nocturia: Secondary | ICD-10-CM

## 2024-10-21 DIAGNOSIS — G4700 Insomnia, unspecified: Secondary | ICD-10-CM

## 2024-10-21 DIAGNOSIS — Z9189 Other specified personal risk factors, not elsewhere classified: Secondary | ICD-10-CM

## 2024-10-21 DIAGNOSIS — R519 Headache, unspecified: Secondary | ICD-10-CM

## 2024-10-22 NOTE — Progress Notes (Unsigned)
 SABRA

## 2024-10-23 ENCOUNTER — Ambulatory Visit: Payer: Self-pay | Admitting: Neurology

## 2024-10-23 NOTE — Procedures (Signed)
   [facility]  HOME SLEEP TEST (Watch PAT) REPORT  STUDY DATE: 10/21/2024  DOB: June 19, 1956  MRN: [id number]  ORDERING CLINICIAN: True Mar, MD, PhD   REFERRING CLINICIAN: Jolinda Norene HERO, DO   CLINICAL INFORMATION/HISTORY (obtained from visit note dated 09/08/2024): 68 year old female with an underlying medical history of breast cancer, status post lumpectomy and XRT, on tamoxifen for 10 years, giant cell arteritis, on Actemra  (followed by rheumatology), right eye impaired vision, allergies, hypertension, irritable bowel syndrome, mitral valve prolapse, and overweight state, who reports snoring and excessive daytime somnolence, as well as witnessed apneas.   Epworth sleepiness score: 3/24.  BMI: 27.8 kg/m  FINDINGS:   Sleep Summary:   Total Recording Time (hours, min): 8 hours, 18 min  Total Sleep Time (hours, min):  7 hours, 36 min  Percent REM (%):    11%   Respiratory Indices:   Calculated pAHI (per hour):  7.7/hour         REM pAHI:    15.9/hour       NREM pAHI: 6.7/hour  Central pAHI: 0/hour  Oxygen Saturation Statistics:    Oxygen Saturation (%) Mean: 93%   Minimum oxygen saturation (%):                 90%   O2 Saturation Range (%): 90-99%    O2 Saturation (minutes) <=88%: 0 min  Pulse Rate Statistics:   Pulse Mean (bpm):    53/min    Pulse Range (40-79/min)   IMPRESSION: OSA (obstructive sleep apnea), mild   RECOMMENDATION:  This home sleep test demonstrates overall mild obstructive sleep apnea - by number of events - with a total AHI of 7.7/hour and O2 nadir of 90%. Snoring was detected, intermittently, in the mild range.  Treatment options include positive airway pressure therapy with an AutoPap machine. Alternative treatments may include weight loss (where appropriate) along with avoidance of the supine sleep position (if possible), or an oral appliance in appropriate candidates.   The patient should be cautioned not to  drive, work at heights, or operate dangerous or heavy equipment when tired or sleepy. Review and reiteration of good sleep hygiene measures should be pursued with any patient. Other causes of the patient's symptoms, including circadian rhythm disturbances, an underlying mood disorder, medication effect and/or an underlying medical problem cannot be ruled out based on this test. Clinical correlation is recommended.  The patient and her referring provider will be notified of the test results. The patient will be seen in follow up in sleep clinic at [facility], as necessary.  I certify that I have reviewed the raw data recording prior to the issuance of this report in accordance with the standards of the American Academy of Sleep Medicine (AASM).  INTERPRETING PHYSICIAN:   True Mar, MD, PhD Medical Director, [facility]) Diplomat, ABPN (Neurology and Sleep)   [facility] [address], [address] [place], [place] [address] [phone]

## 2024-10-26 ENCOUNTER — Other Ambulatory Visit: Payer: Self-pay | Admitting: *Deleted

## 2024-10-26 DIAGNOSIS — Z79899 Other long term (current) drug therapy: Secondary | ICD-10-CM

## 2024-10-27 ENCOUNTER — Ambulatory Visit: Payer: Self-pay | Admitting: Rheumatology

## 2024-10-27 NOTE — Progress Notes (Signed)
 CBC normal, CMP stable.

## 2024-10-28 LAB — CBC WITH DIFFERENTIAL/PLATELET
Absolute Lymphocytes: 1369 {cells}/uL (ref 850–3900)
Absolute Monocytes: 433 {cells}/uL (ref 200–950)
Basophils Absolute: 39 {cells}/uL (ref 0–200)
Basophils Relative: 1 %
Eosinophils Absolute: 121 {cells}/uL (ref 15–500)
Eosinophils Relative: 3.1 %
HCT: 37.5 % (ref 35.0–45.0)
Hemoglobin: 12.5 g/dL (ref 11.7–15.5)
MCH: 31.3 pg (ref 27.0–33.0)
MCHC: 33.3 g/dL (ref 32.0–36.0)
MCV: 94 fL (ref 80.0–100.0)
MPV: 12.1 fL (ref 7.5–12.5)
Monocytes Relative: 11.1 %
Neutro Abs: 1938 {cells}/uL (ref 1500–7800)
Neutrophils Relative %: 49.7 %
Platelets: 149 Thousand/uL (ref 140–400)
RBC: 3.99 Million/uL (ref 3.80–5.10)
RDW: 12.9 % (ref 11.0–15.0)
Total Lymphocyte: 35.1 %
WBC: 3.9 Thousand/uL (ref 3.8–10.8)

## 2024-10-28 LAB — COMPREHENSIVE METABOLIC PANEL WITH GFR
AG Ratio: 2.4 (calc) (ref 1.0–2.5)
ALT: 18 U/L (ref 6–29)
AST: 23 U/L (ref 10–35)
Albumin: 4.1 g/dL (ref 3.6–5.1)
Alkaline phosphatase (APISO): 40 U/L (ref 37–153)
BUN: 15 mg/dL (ref 7–25)
CO2: 28 mmol/L (ref 20–32)
Calcium: 8.8 mg/dL (ref 8.6–10.4)
Chloride: 104 mmol/L (ref 98–110)
Creat: 0.83 mg/dL (ref 0.50–1.05)
Globulin: 1.7 g/dL — ABNORMAL LOW (ref 1.9–3.7)
Glucose, Bld: 70 mg/dL (ref 65–99)
Potassium: 3.6 mmol/L (ref 3.5–5.3)
Sodium: 142 mmol/L (ref 135–146)
Total Bilirubin: 0.7 mg/dL (ref 0.2–1.2)
Total Protein: 5.8 g/dL — ABNORMAL LOW (ref 6.1–8.1)
eGFR: 77 mL/min/1.73m2 (ref >=60)

## 2024-10-28 LAB — QUANTIFERON-TB GOLD PLUS
Mitogen-NIL: >10 [IU]/mL
NIL: 0.03 [IU]/mL
QuantiFERON-TB Gold Plus: NEGATIVE
TB1-NIL: 0.15 [IU]/mL
TB2-NIL: 0.14 [IU]/mL

## 2024-10-28 NOTE — Progress Notes (Signed)
CBC, CMP are normal.  TB Gold is negative.

## 2024-10-29 NOTE — Progress Notes (Signed)
 Cindy Blake                                          MRN: [id number]   10/29/2024   The [facility] Quality Team Specialist reviewed this patient medical record for the purposes of chart review for care gap closure. The following were reviewed: chart review for care gap closure-controlling blood pressure.    VBCI Quality Team

## 2024-10-29 NOTE — Progress Notes (Signed)
 Office Visit Note  Patient: Cindy Blake             Date of Birth: 1956-10-28           MRN: [id number]             PCP: Jolinda Norene HERO, DO Referring: Jolinda Norene HERO, DO Visit Date: 11/11/2024 Occupation: Data Unavailable  Subjective:  Medication management  History of Present Illness: Cindy Blake is a 68 y.o. female with temporal arteritis and osteoporosis.  She returns today after her last visit in September 2025.  She has been on Actemra  162 mg subcu every 7 days.  She denies any interruption in the treatment.  She states she had 2 upper respiratory tract infections which did not require antibiotics.  She has upper respiratory tract infection currently for which she has been taking over-the-counter medications and is feeling better.  She has been getting Zometa  infusions through her oncologist.  She has an appointment coming up with the ophthalmologist on November 30, 2024.  She has not noticed any change in her vision.  She has been taking calcium and vitamin D .  She had a shingles breakout on her face on October 31.  She states she took Varivax for the infection.    Activities of Daily Living:  Patient reports morning stiffness for 1 minute.   Patient Denies nocturnal pain.  Difficulty dressing/grooming: Denies Difficulty climbing stairs: Denies Difficulty getting out of chair: Denies Difficulty using hands for taps, buttons, cutlery, and/or writing: Denies  Review of Systems  Constitutional:  Positive for fatigue.  HENT:  Negative for mouth sores and mouth dryness.   Eyes:  Negative for dryness.  Respiratory:  Negative for shortness of breath.   Cardiovascular:  Negative for chest pain and palpitations.  Gastrointestinal:  Negative for blood in stool, constipation and diarrhea.  Endocrine: Negative for increased urination.  Genitourinary:  Negative for involuntary urination.  Musculoskeletal:  Positive for morning stiffness. Negative for joint pain, gait  problem, joint pain, joint swelling, myalgias, muscle weakness, muscle tenderness and myalgias.  Skin:  Negative for color change, rash, hair loss and sensitivity to sunlight.  Allergic/Immunologic: Positive for susceptible to infections.  Neurological:  Negative for dizziness and headaches.  Hematological:  Negative for swollen glands.  Psychiatric/Behavioral:  Positive for sleep disturbance. Negative for depressed mood. The patient is not nervous/anxious.     PMFS History:  Patient Active Problem List   Diagnosis Date Noted   Temporal arteritis (HCC) 01/28/2024   High risk medication use 01/28/2024   Osteoporosis 12/08/2019   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) 02/01/2015   Generalized anxiety disorder 11/10/2013   Allergic rhinitis 11/10/2013   Hyperlipemia 07/06/2013   Essential hypertension 07/06/2013   Vitamin D  deficiency 07/06/2013    Past Medical History:  Diagnosis Date   Allergy    Cancer (HCC)    Right Breast   Complication of anesthesia    Family history of adverse reaction to anesthesia    nausea   History of colon polyps    Hypertension    IBS (irritable bowel syndrome)    Mitral valve disorder    MVP (mitral valve prolapse)    PONV (postoperative nausea and vomiting)     Family History  Problem Relation Age of Onset   Cancer Mother    Hypertension Father    Stroke Father    Heart attack Father    Arthritis Father    Hypertension Sister  Hypertension Sister    Lung cancer Brother    Multiple sclerosis Brother    Healthy Daughter    Sleep apnea Neg Hx    Past Surgical History:  Procedure Laterality Date   ABDOMINAL HYSTERECTOMY  age 73   APPENDECTOMY     ARTERY BIOPSY Right 09/21/2022   Procedure: Right BIOPSY TEMPORAL ARTERY;  Surgeon: Sheree Penne Bruckner, MD;  Location: [facility] OR;  Service: Vascular;  Laterality: Right;   CATARACT EXTRACTION, BILATERAL  04/07/2024   04/21/2024   CHOLECYSTECTOMY     COLONOSCOPY  09/2024    LASIK  2000   SHOULDER SURGERY Right    bone spur   Social History   Tobacco Use   Smoking status: Never    Passive exposure: Never   Smokeless tobacco: Never  Vaping Use   Vaping status: Never Used  Substance Use Topics   Alcohol use: Not Currently   Drug use: No   Social History   Social History Narrative   Pt lives with husband    Pt works      Immunization History  Administered Date(s) Administered   Fluad Quad(high Dose 65+) 10/10/2022   Fluad Trivalent(High Dose 65+) 09/11/2023   Influenza Inj Mdck Quad With Preservative 09/19/2020   Influenza,inj,Quad PF,6+ Mos 10/11/2015, 09/03/2016, 09/26/2017, 10/08/2018, 10/05/2019   Influenza-Unspecified 09/22/2020, 09/27/2021   Moderna Covid-19 Fall Seasonal Vaccine 43yrs & older 12/24/2022, 11/05/2023   PFIZER(Purple Top)SARS-COV-2 Vaccination 02/14/2020, 03/06/2020, 09/30/2020, 09/08/2021   Pneumococcal Conjugate-13 01/09/2017   Pneumococcal Polysaccharide-23 07/07/2021   Tdap 06/05/2018   Zoster Recombinant(Shingrix) 07/07/2021, 10/10/2021     Objective: Vital Signs: BP 129/75   Pulse (!) 52   Temp 97.7 F (36.5 C)   Resp 15   Ht 5' 3 (1.6 m)   Wt 156 lb 9.6 oz (71 kg)   BMI 27.74 kg/m    Physical Exam Vitals and nursing note reviewed.  Constitutional:      Appearance: She is well-developed.  HENT:     Head: Normocephalic and atraumatic.  Eyes:     Conjunctiva/sclera: Conjunctivae normal.  Cardiovascular:     Rate and Rhythm: Normal rate and regular rhythm.     Heart sounds: Normal heart sounds.  Pulmonary:     Effort: Pulmonary effort is normal.     Breath sounds: Normal breath sounds.  Abdominal:     General: Bowel sounds are normal.     Palpations: Abdomen is soft.  Musculoskeletal:     Cervical back: Normal range of motion.  Lymphadenopathy:     Cervical: No cervical adenopathy.  Skin:    General: Skin is warm and dry.     Capillary Refill: Capillary refill takes less than 2 seconds.   Neurological:     Mental Status: She is alert and oriented to person, place, and time.  Psychiatric:        Behavior: Behavior normal.      Musculoskeletal Exam:Cervical, thoracic and lumbar spine were in good range of motion.  There was no SI joint tenderness.  Shoulder joints, elbow joints, wrist joints, MCPs, PIPs and DIPs were in good range of motion with no synovitis.  Hip joints and knee joints were in good range of motion without any warmth swelling or effusion.  There was no tenderness over ankles or MTPs.   CDAI Exam: CDAI Score: -- Patient Global: --; Provider Global: -- Swollen: --; Tender: -- Joint Exam 11/11/2024   No joint exam has been documented for this visit  There is currently no information documented on the homunculus. Go to the Rheumatology activity and complete the homunculus joint exam.  Investigation: No additional findings.  Imaging: Home sleep test Result Date: 10/21/2024 Buck Saucer, MD     10/23/2024  9:54 AM  [facility] HOME SLEEP TEST (Watch PAT) REPORT STUDY DATE: 10/21/2024 DOB: [birth year] MRN: [id number] ORDERING CLINICIAN: Saucer Buck, MD, PhD  REFERRING CLINICIAN: Jolinda Norene HERO, DO CLINICAL INFORMATION/HISTORY (obtained from visit note dated 09/08/2024): 68 year old female with an underlying medical history of breast cancer, status post lumpectomy and XRT, on tamoxifen for 10 years, giant cell arteritis, on Actemra  (followed by rheumatology), right eye impaired vision, allergies, hypertension, irritable bowel syndrome, mitral valve prolapse, and overweight state, who reports snoring and excessive daytime somnolence, as well as witnessed apneas. Epworth sleepiness score: 3/24. BMI: 27.8 kg/m FINDINGS: Sleep Summary: Total Recording Time (hours, min): 8 hours, 18 min Total Sleep Time (hours, min):  7 hours, 36 min Percent REM (%):    11% Respiratory Indices: Calculated pAHI (per hour):  7.7/hour       REM pAHI:    15.9/hour      NREM pAHI: 6.7/hour Central pAHI: 0/hour Oxygen Saturation Statistics:  Oxygen Saturation (%) Mean: 93% Minimum oxygen saturation (%):                 90% O2 Saturation Range (%): 90-99%  O2 Saturation (minutes) <=88%: 0 min Pulse Rate Statistics: Pulse Mean (bpm):    53/min  Pulse Range (40-79/min) IMPRESSION: OSA (obstructive sleep apnea), mild RECOMMENDATION: This home sleep test demonstrates overall mild obstructive sleep apnea - by number of events - with a total AHI of 7.7/hour and O2 nadir of 90%. Snoring was detected, intermittently, in the mild range.  Treatment options include positive airway pressure therapy with an AutoPap machine. Alternative treatments may include weight loss (where appropriate) along with avoidance of the supine sleep position (if possible), or an oral appliance in appropriate candidates.  The patient should be cautioned not to drive, work at heights, or operate dangerous or heavy equipment when tired or sleepy. Review and reiteration of good sleep hygiene measures should be pursued with any patient. Other causes of the patient's symptoms, including circadian rhythm disturbances, an underlying mood disorder, medication effect and/or an underlying medical problem cannot be ruled out based on this test. Clinical correlation is recommended. The patient and her referring provider will be notified of the test results. The patient will be seen in follow up in sleep clinic at [facility], as necessary. I certify that I have reviewed the raw data recording prior to the issuance of this report in accordance with the standards of the American Academy of Sleep Medicine (AASM). INTERPRETING PHYSICIAN: Saucer Buck, MD, PhD Medical Director, [facility]) Diplomat, ABPN (Neurology and Sleep) [facility] [address], [address] [place], [place] [address] [phone]    Recent Labs: Lab Results  Component Value Date   WBC 3.9 10/26/2024   HGB  12.5 10/26/2024   PLT 149 10/26/2024   NA 142 10/26/2024   K 3.6 10/26/2024   CL 104 10/26/2024   CO2 28 10/26/2024   GLUCOSE 70 10/26/2024   BUN 15 10/26/2024   CREATININE 0.83 10/26/2024   BILITOT 0.7 10/26/2024   ALKPHOS 54 11/05/2023   AST 23 10/26/2024   ALT 18 10/26/2024   PROT 5.8 (L) 10/26/2024   ALBUMIN 4.2 11/05/2023   CALCIUM 8.8 10/26/2024   GFRAA  99 07/06/2020   QFTBGOLDPLUS NEGATIVE 10/26/2024    Speciality Comments: Ophthalmologist-[facility] Samantha Dunnington Actemra  10/04/22 Zometa  02/25, 08/25  Procedures:  No procedures performed Allergies: Shellfish protein-containing drug products and Sulfonamide derivatives   Assessment / Plan:     Visit Diagnoses: Temporal arteritis (HCC) - Biopsy-proven with right eye vision loss.  Dxd October 2023. Patient has been off prednisone  since July 20, 2023.  She denies any change in her vision.  She has an appointment coming up with the ophthalmologist this month.  Amaurosis fugax of right eye - Followed by Dr. Delories at [facility] eye care.She had bilateral cataract surgery  and recovered well.patient denies any change in her vision.  High risk medication use - Actemra  162 mg subcu every 7 days.  October 26, 2024 CBC and CMP were normal.  TB Gold was negative.  She was advised to get labs every  Age-related osteoporosis without current pathological fracture - Zometa  IV x 2 by the oncologist.IV 01/2023.11/25/2023 bone mineral density as measured at the left radius 33% region of interest is 0.613 g/cm2. T-score: -3.0.  Vitamin D  deficiency - followed by oncology.  Primary hypertension-blood pressure was elevated at 143/76.  Repeat blood pressure was 129/75.  She was advised to monitor blood pressure closely.  Malignant neoplasm of upper-inner quadrant of left breast in female, estrogen receptor positive (HCC) - 2016 lumpectomy, LN resection, RTX and tamoxifen, followed by [facility] oncology.  Generalized anxiety  disorder - she takes Xanax  as needed.  Pure hypercholesterolemia-August 2025 LDL was elevated at 104.  She has been followed by her PCP.  Orders: No orders of the defined types were placed in this encounter.  No orders of the defined types were placed in this encounter.    Follow-Up Instructions: Return in about 3 months (around 02/09/2025) for TA.   Maya Nash, MD  Note - This record has been created using Animal nutritionist.  Chart creation errors have been sought, but may not always  have been located. Such creation errors do not reflect on  the standard of medical care.

## 2024-11-11 ENCOUNTER — Encounter: Payer: Self-pay | Admitting: Rheumatology

## 2024-11-11 ENCOUNTER — Ambulatory Visit: Attending: Rheumatology | Admitting: Rheumatology

## 2024-11-11 VITALS — BP 129/75 | HR 52 | Temp 97.7°F | Resp 15 | Ht 63.0 in | Wt 156.6 lb

## 2024-11-11 DIAGNOSIS — E7800 Pure hypercholesterolemia, unspecified: Secondary | ICD-10-CM

## 2024-11-11 DIAGNOSIS — G453 Amaurosis fugax: Secondary | ICD-10-CM

## 2024-11-11 DIAGNOSIS — C50212 Malignant neoplasm of upper-inner quadrant of left female breast: Secondary | ICD-10-CM

## 2024-11-11 DIAGNOSIS — M810 Age-related osteoporosis without current pathological fracture: Secondary | ICD-10-CM | POA: Diagnosis not present

## 2024-11-11 DIAGNOSIS — Z79899 Other long term (current) drug therapy: Secondary | ICD-10-CM | POA: Diagnosis not present

## 2024-11-11 DIAGNOSIS — Z170 Estrogen receptor positive status [ER+]: Secondary | ICD-10-CM

## 2024-11-11 DIAGNOSIS — M316 Other giant cell arteritis: Secondary | ICD-10-CM | POA: Diagnosis not present

## 2024-11-11 DIAGNOSIS — F411 Generalized anxiety disorder: Secondary | ICD-10-CM

## 2024-11-11 DIAGNOSIS — I10 Essential (primary) hypertension: Secondary | ICD-10-CM

## 2024-11-11 DIAGNOSIS — E559 Vitamin D deficiency, unspecified: Secondary | ICD-10-CM

## 2024-11-11 NOTE — Patient Instructions (Signed)
 Standing Labs We placed an order today for your standing lab work.   Please have your standing labs drawn in February and every 3 months  Please have your labs drawn 2 weeks prior to your appointment so that the provider can discuss your lab results at your appointment, if possible.  Please note that you may see your imaging and lab results in MyChart before we have reviewed them. We will contact you once all results are reviewed. Please allow our office up to 72 hours to thoroughly review all of the results before contacting the office for clarification of your results.  WALK-IN LAB HOURS  Monday through Thursday from 8:00 am - 4:30 pm and Friday from 8:00 am-12:00 pm.  Patients with office visits requiring labs will be seen before walk-in labs.  You may encounter longer than normal wait times. Please allow additional time. Wait times may be shorter on  Monday and Thursday afternoons.  We do not book appointments for walk-in labs. We appreciate your patience and understanding with our staff.   Labs are drawn by [facility]. Please bring your co-pay at the time of your lab draw.  You may receive a bill from [facility] for your lab work.  Please note if you are on Hydroxychloroquine and and an order has been placed for a Hydroxychloroquine level,  you will need to have it drawn 4 hours or more after your last dose.  If you wish to have your labs drawn at another location, please call the office 24 hours in advance so we can fax the orders.  The office is located at [address], [address], [place], [place] [address]   If you have any questions regarding directions or hours of operation,  please call [phone].   As a reminder, please drink plenty of water prior to coming for your lab work. Thanks!   Vaccines You are taking a medication(s) that can suppress your immune system.  The following immunizations are recommended: Flu annually Covid-19  RSV Td/Tdap (tetanus, diphtheria, pertussis)  every 10 years Pneumonia (Prevnar 15 then Pneumovax 23 at least 1 year apart.  Alternatively, can take Prevnar 20 without needing additional dose) Shingrix: 2 doses from 4 weeks to 6 months apart  Please check with your PCP to make sure you are up to date.   If you have signs or symptoms of an infection or start antibiotics: First, call your PCP for workup of your infection. Hold your medication through the infection, until you complete your antibiotics, and until symptoms resolve if you take the following: Injectable medication (Actemra , Benlysta, Cimzia, Cosentyx, Enbrel, Humira, Kevzara, Orencia, Remicade, Simponi, Stelara, Taltz, Tremfya) Methotrexate Leflunomide (Arava) Mycophenolate (Cellcept) Earma, Olumiant, or Rinvoq

## 2024-11-12 DIAGNOSIS — Z6827 Body mass index (BMI) 27.0-27.9, adult: Secondary | ICD-10-CM | POA: Diagnosis not present

## 2024-11-12 DIAGNOSIS — Z1331 Encounter for screening for depression: Secondary | ICD-10-CM | POA: Diagnosis not present

## 2024-11-12 DIAGNOSIS — Z90710 Acquired absence of both cervix and uterus: Secondary | ICD-10-CM | POA: Diagnosis not present

## 2024-11-12 DIAGNOSIS — Z853 Personal history of malignant neoplasm of breast: Secondary | ICD-10-CM | POA: Diagnosis not present

## 2024-11-12 DIAGNOSIS — Z01419 Encounter for gynecological examination (general) (routine) without abnormal findings: Secondary | ICD-10-CM | POA: Diagnosis not present

## 2024-11-25 DIAGNOSIS — R92323 Mammographic fibroglandular density, bilateral breasts: Secondary | ICD-10-CM | POA: Diagnosis not present

## 2024-11-25 DIAGNOSIS — Z1231 Encounter for screening mammogram for malignant neoplasm of breast: Secondary | ICD-10-CM | POA: Diagnosis not present

## 2024-11-25 DIAGNOSIS — C50212 Malignant neoplasm of upper-inner quadrant of left female breast: Secondary | ICD-10-CM | POA: Diagnosis not present

## 2024-11-25 DIAGNOSIS — Z170 Estrogen receptor positive status [ER+]: Secondary | ICD-10-CM | POA: Diagnosis not present

## 2024-11-30 DIAGNOSIS — H47011 Ischemic optic neuropathy, right eye: Secondary | ICD-10-CM | POA: Diagnosis not present

## 2024-11-30 DIAGNOSIS — H54415A Blindness right eye category 5, normal vision left eye: Secondary | ICD-10-CM | POA: Diagnosis not present

## 2024-11-30 DIAGNOSIS — Z961 Presence of intraocular lens: Secondary | ICD-10-CM | POA: Diagnosis not present

## 2024-11-30 DIAGNOSIS — H40012 Open angle with borderline findings, low risk, left eye: Secondary | ICD-10-CM | POA: Diagnosis not present

## 2024-12-01 ENCOUNTER — Encounter: Payer: Self-pay | Admitting: Family Medicine

## 2024-12-01 ENCOUNTER — Ambulatory Visit (INDEPENDENT_AMBULATORY_CARE_PROVIDER_SITE_OTHER): Payer: Self-pay | Admitting: Family Medicine

## 2024-12-01 VITALS — BP 130/70 | HR 61 | Temp 97.7°F | Ht 63.0 in | Wt 158.0 lb

## 2024-12-01 DIAGNOSIS — E559 Vitamin D deficiency, unspecified: Secondary | ICD-10-CM | POA: Diagnosis not present

## 2024-12-01 DIAGNOSIS — F411 Generalized anxiety disorder: Secondary | ICD-10-CM

## 2024-12-01 DIAGNOSIS — G4733 Obstructive sleep apnea (adult) (pediatric): Secondary | ICD-10-CM | POA: Insufficient documentation

## 2024-12-01 DIAGNOSIS — Z79899 Other long term (current) drug therapy: Secondary | ICD-10-CM

## 2024-12-01 MED ORDER — ALPRAZOLAM 0.5 MG PO TABS: ORAL_TABLET | ORAL | 4 refills | Status: AC

## 2024-12-01 NOTE — Progress Notes (Signed)
 "  Subjective: CC: Anxiety PCP: Jolinda Cindy HERO, DO YEP:Dunmfp Cindy Blake is a 68 y.o. female presenting to clinic today for:  Overall been doing okay on just 0.5 mg daily as needed of alprazolam .  She did have an exacerbation in anxiety when she had to take a flight by herself to [place].  This is the first time she has flown by herself in 10 years and got lost in the [place] airport.  Her nerves are pretty tore up by the time she got to [place].  However, she has been doing okay since that time.  She reports good checkup of her left eye.  She actually had a little snack food when she had a shingles flareup on that left side but likely it was caught in time and she was placed on appropriate antivirals and she has had no issues since.   ROS: Per HPI  Allergies[1] Past Medical History:  Diagnosis Date   Allergy    Cancer (HCC)    Right Breast   Complication of anesthesia    Family history of adverse reaction to anesthesia    nausea   History of colon polyps    Hypertension    IBS (irritable bowel syndrome)    Mitral valve disorder    MVP (mitral valve prolapse)    PONV (postoperative nausea and vomiting)    Current Medications[2] Social History   Socioeconomic History   Marital status: Married    Spouse name: Not on file   Number of children: Not on file   Years of education: Not on file   Highest education level: Some college, no degree  Occupational History   Not on file  Tobacco Use   Smoking status: Never    Passive exposure: Never   Smokeless tobacco: Never  Vaping Use   Vaping status: Never Used  Substance and Sexual Activity   Alcohol use: Not Currently   Drug use: No   Sexual activity: Not on file  Other Topics Concern   Not on file  Social History Narrative   Pt lives with husband    Pt works    Social Drivers of Health   Tobacco Use: Low Risk (11/12/2024)   Received from [facility]   Patient History    Smoking Tobacco Use: Never    Smokeless  Tobacco Use: Never    Passive Exposure: Never  Financial Resource Strain: Low Risk (11/27/2024)   Overall Financial Resource Strain (CARDIA)    Difficulty of Paying Living Expenses: Not hard at all  Food Insecurity: No Food Insecurity (11/27/2024)   Epic    Worried About Radiation Protection Practitioner of Food in the Last Year: Never true    Ran Out of Food in the Last Year: Never true  Transportation Needs: No Transportation Needs (11/27/2024)   Epic    Lack of Transportation (Medical): No    Lack of Transportation (Non-Medical): No  Physical Activity: Insufficiently Active (11/27/2024)   Exercise Vital Sign    Days of Exercise per Week: 3 days    Minutes of Exercise per Session: 30 min  Stress: No Stress Concern Present (11/27/2024)   [employer] of Occupational Health - Occupational Stress Questionnaire    Feeling of Stress: Not at all  Social Connections: Moderately Integrated (11/27/2024)   Social Connection and Isolation Panel    Frequency of Communication with Friends and Family: More than three times a week    Frequency of Social Gatherings with Friends and Family: Twice a week  Attends Religious Services: More than 4 times per year    Active Member of Clubs or Organizations: No    Attends Banker Meetings: Not on file    Marital Status: Married  Intimate Partner Violence: Not At Risk (11/09/2024)   Received from [facility]   HITS    Over the last 12 months how often did your partner physically hurt you?: Never    Over the last 12 months how often did your partner insult you or talk down to you?: Never    Over the last 12 months how often did your partner threaten you with physical harm?: Never    Over the last 12 months how often did your partner scream or curse at you?: Never  Depression (PHQ2-9): Low Risk (08/03/2024)   Depression (PHQ2-9)    PHQ-2 Score: 0  Alcohol Screen: Not on file  Housing: Low Risk (11/27/2024)   Epic    Unable to Pay for Housing in the  Last Year: No    Number of Times Moved in the Last Year: 0    Homeless in the Last Year: No  Utilities: Not At Risk (11/09/2024)   Received from [facility]    In the past 12 months has the electric, gas, oil, or water company threatened to shut off services in your home?: No  Health Literacy: Not on file   Family History  Problem Relation Age of Onset   Cancer Mother    Hypertension Father    Stroke Father    Heart attack Father    Arthritis Father    Hypertension Sister    Hypertension Sister    Lung cancer Brother    Multiple sclerosis Brother    Healthy Daughter    Sleep apnea Neg Hx     Objective: Office vital signs reviewed. BP 130/70   Pulse 61   Temp 97.7 F (36.5 C)   Ht 5' 3 (1.6 m)   Wt 158 lb (71.7 kg)   SpO2 99%   BMI 27.99 kg/m   Physical Examination:  General: Awake, alert, well nourished, No acute distress HEENT: Sclera white.  Moist mucous membranes Cardio: regular rate and rhythm, S1S2 heard, no murmurs appreciated Pulm: clear to auscultation bilaterally, no wheezes, rhonchi or rales; normal work of breathing on room air PSych: Mood stable, speech normal.     08/03/2024   10:42 AM 04/08/2024    2:02 PM 01/10/2024   11:22 AM  Depression screen PHQ 2/9  Decreased Interest 0 0 0  Down, Depressed, Hopeless 0 0 0  PHQ - 2 Score 0 0 0  Altered sleeping 0 0 0  Tired, decreased energy 0 0 0  Change in appetite 0 0 0  Feeling bad or failure about yourself  0 0 0  Trouble concentrating 0 0 0  Moving slowly or fidgety/restless 0 0 0  Suicidal thoughts 0 0 0  PHQ-9 Score 0  0  0   Difficult doing work/chores Not difficult at all Not difficult at all Not difficult at all     Data saved with a previous flowsheet row definition      08/03/2024   10:42 AM 04/08/2024    2:01 PM 01/10/2024   11:22 AM 09/10/2023    1:09 PM  GAD 7 : Generalized Anxiety Score  Nervous, Anxious, on Edge 0 0 0 0  Control/stop worrying 0 0 0 0  Worry too much -  different things 0 0  0 0  Trouble relaxing 0 0 0 0  Restless 0 0 0 0  Easily annoyed or irritable 0 0 0 0  Afraid - awful might happen 0 0 0 0  Total GAD 7 Score 0 0 0 0  Anxiety Difficulty Not difficult at all Not difficult at all Not difficult at all Not difficult at all    Assessment/ Plan: 68 y.o. female   Generalized anxiety disorder - Plan: ALPRAZolam  (XANAX ) 0.5 MG tablet  Chronic prescription benzodiazepine use  Mild obstructive sleep apnea in adult  Vitamin D  deficiency - Plan: Vitamin D , 25-hydroxy   Alprazolam  renewed for as needed use.  UDS and CSA are up-to-date and the national narcotic database reviewed with no red flags.  She continue sparing use of this medication  Check vitamin D  given history of deficiency.  All other labs were recently obtained by specialist   Cindy CHRISTELLA Fielding, DO [facility] [phone]     [1]  Allergies Allergen Reactions   Shellfish Protein-Containing Drug Products Hives   Sulfonamide Derivatives     REACTION: hives, itching, swelling  [2]  Current Outpatient Medications:    Acetaminophen  (TYLENOL  PO), Take by mouth as needed., Disp: , Rfl:    ACTEMRA  ACTPEN 162 MG/0.9ML SOAJ, INJECT 1 PEN UNDER THE SKIN EVERY 7 DAYS, Disp: 10.8 mL, Rfl: 0   ALPRAZolam  (XANAX ) 0.5 MG tablet, TAKE 1/2 TO 1 TABLET qhs AS NEEDED FOR ANXIETY & SLEEP. *dose change (Patient taking differently: at bedtime. TAKE 1/2 TO 1 TABLET qhs AS NEEDED FOR ANXIETY & SLEEP. *dose change), Disp: 30 tablet, Rfl: 4   ascorbic acid (VITAMIN C) 500 MG tablet, Take 500 mg by mouth daily., Disp: , Rfl:    aspirin 81 MG tablet, Take 81 mg by mouth daily., Disp: , Rfl:    Calcium Carbonate (CALCIUM 600 PO), Take 600 mg by mouth daily., Disp: , Rfl:    cetirizine (ZYRTEC) 10 MG tablet, Take 10 mg by mouth every morning., Disp: , Rfl:    Cholecalciferol (VITAMIN D ) 2000 UNITS CAPS, Take 2,000 Units by mouth every morning., Disp: , Rfl:     cyanocobalamin  (VITAMIN B12) 1000 MCG tablet, Take 1,000 mcg by mouth daily., Disp: , Rfl:    diphenhydrAMINE  (BENADRYL ) 25 MG tablet, Take 25 mg by mouth every 6 (six) hours as needed (Congestion)., Disp: , Rfl:    hydrochlorothiazide  (HYDRODIURIL ) 25 MG tablet, TAKE 1 TABLET (25 MG TOTAL) BY MOUTH DAILY., Disp: 90 tablet, Rfl: 3   losartan  (COZAAR ) 50 MG tablet, TAKE 1 TABLET BY MOUTH EVERY DAY, Disp: 90 tablet, Rfl: 3   potassium chloride  SA (KLOR-CON  M) 20 MEQ tablet, Take 1 tablet (20 mEq total) by mouth daily. To replace 10 MEQ, Disp: 90 tablet, Rfl: 3   tamoxifen (NOLVADEX) 20 MG tablet, Take 20 mg by mouth at bedtime., Disp: , Rfl:    Zoledronic  Acid (ZOMETA  IV), Inject 4 mg into the vein every 6 (six) months. Last IV:01/2024, Disp: , Rfl:   "

## 2024-12-02 LAB — VITAMIN D 25 HYDROXY (VIT D DEFICIENCY, FRACTURES): Vit D, 25-Hydroxy: 43.1 ng/mL (ref 30.0–100.0)

## 2024-12-04 ENCOUNTER — Ambulatory Visit: Payer: Self-pay | Admitting: Family Medicine

## 2024-12-24 ENCOUNTER — Other Ambulatory Visit: Payer: Self-pay | Admitting: Rheumatology

## 2024-12-24 NOTE — Telephone Encounter (Signed)
 Last Fill: 09/30/2024  Labs: 10/26/2024 CBC normal, CMP stable.   TB Gold: 10/26/2024 negative    Next Visit: 02/10/2025  Last Visit: 11/11/2024  DX: Temporal arteritis   Current Dose per office note on 11/11/2024: Actemra  162 mg subcu every 7 days.   Okay to refill Actemra ?

## 2025-02-10 ENCOUNTER — Ambulatory Visit: Admitting: Physician Assistant

## 2025-06-01 ENCOUNTER — Ambulatory Visit: Admitting: Family Medicine
